# Patient Record
Sex: Male | Born: 1960 | Race: White | Hispanic: No | Marital: Married | Smoking: Former smoker
Health system: Southern US, Community
[De-identification: ages and names within clinical notes are randomized; demographics above are authoritative.]

## PROBLEM LIST (undated history)

## (undated) DIAGNOSIS — E559 Vitamin D deficiency, unspecified: Secondary | ICD-10-CM

## (undated) DIAGNOSIS — I70209 Unspecified atherosclerosis of native arteries of extremities, unspecified extremity: Secondary | ICD-10-CM

## (undated) DIAGNOSIS — E119 Type 2 diabetes mellitus without complications: Secondary | ICD-10-CM

## (undated) DIAGNOSIS — I10 Essential (primary) hypertension: Secondary | ICD-10-CM

## (undated) DIAGNOSIS — R7989 Other specified abnormal findings of blood chemistry: Secondary | ICD-10-CM

## (undated) DIAGNOSIS — R011 Cardiac murmur, unspecified: Secondary | ICD-10-CM

## (undated) DIAGNOSIS — R739 Hyperglycemia, unspecified: Secondary | ICD-10-CM

## (undated) DIAGNOSIS — E785 Hyperlipidemia, unspecified: Secondary | ICD-10-CM

## (undated) DIAGNOSIS — F410 Panic disorder [episodic paroxysmal anxiety] without agoraphobia: Secondary | ICD-10-CM

## (undated) DIAGNOSIS — N529 Male erectile dysfunction, unspecified: Secondary | ICD-10-CM

## (undated) DIAGNOSIS — Z720 Tobacco use: Secondary | ICD-10-CM

## (undated) DIAGNOSIS — K5790 Diverticulosis of intestine, part unspecified, without perforation or abscess without bleeding: Secondary | ICD-10-CM

## (undated) HISTORY — DX: Type 2 diabetes mellitus without complications: E11.9

## (undated) HISTORY — DX: Cardiac murmur, unspecified: R01.1

## (undated) HISTORY — DX: Hyperglycemia, unspecified: R73.9

## (undated) HISTORY — DX: Diverticulosis of intestine, part unspecified, without perforation or abscess without bleeding: K57.90

## (undated) HISTORY — DX: Unspecified atherosclerosis of native arteries of extremities, unspecified extremity: I70.209

## (undated) HISTORY — DX: Hyperlipidemia, unspecified: E78.5

## (undated) HISTORY — DX: Panic disorder (episodic paroxysmal anxiety): F41.0

## (undated) HISTORY — PX: VASECTOMY: SHX75

## (undated) HISTORY — DX: Essential (primary) hypertension: I10

## (undated) HISTORY — DX: Male erectile dysfunction, unspecified: N52.9

## (undated) HISTORY — DX: Tobacco use: Z72.0

## (undated) HISTORY — DX: Vitamin D deficiency, unspecified: E55.9

## (undated) HISTORY — PX: TONSILLECTOMY: SHX5217

## (undated) HISTORY — DX: Other specified abnormal findings of blood chemistry: R79.89

---

## 1998-02-03 ENCOUNTER — Ambulatory Visit (HOSPITAL_BASED_OUTPATIENT_CLINIC_OR_DEPARTMENT_OTHER): Admission: RE | Admit: 1998-02-03 | Discharge: 1998-02-03 | Payer: Self-pay | Admitting: *Deleted

## 2000-08-20 ENCOUNTER — Encounter (INDEPENDENT_AMBULATORY_CARE_PROVIDER_SITE_OTHER): Payer: Self-pay | Admitting: Specialist

## 2000-08-20 ENCOUNTER — Other Ambulatory Visit: Admission: RE | Admit: 2000-08-20 | Discharge: 2000-08-20 | Payer: Self-pay | Admitting: Urology

## 2004-01-13 ENCOUNTER — Ambulatory Visit (HOSPITAL_COMMUNITY): Admission: RE | Admit: 2004-01-13 | Discharge: 2004-01-13 | Payer: Self-pay | Admitting: Internal Medicine

## 2005-06-27 ENCOUNTER — Ambulatory Visit: Payer: Self-pay | Admitting: Internal Medicine

## 2005-07-11 ENCOUNTER — Ambulatory Visit: Payer: Self-pay | Admitting: Internal Medicine

## 2005-08-08 ENCOUNTER — Ambulatory Visit: Payer: Self-pay | Admitting: Internal Medicine

## 2006-02-09 ENCOUNTER — Ambulatory Visit: Payer: Self-pay | Admitting: Internal Medicine

## 2006-05-25 ENCOUNTER — Ambulatory Visit: Payer: Self-pay | Admitting: Internal Medicine

## 2006-08-16 ENCOUNTER — Ambulatory Visit: Payer: Self-pay | Admitting: Internal Medicine

## 2006-08-22 ENCOUNTER — Encounter: Payer: Self-pay | Admitting: Internal Medicine

## 2006-08-22 DIAGNOSIS — I10 Essential (primary) hypertension: Secondary | ICD-10-CM

## 2006-08-22 DIAGNOSIS — E785 Hyperlipidemia, unspecified: Secondary | ICD-10-CM

## 2006-08-22 DIAGNOSIS — E1169 Type 2 diabetes mellitus with other specified complication: Secondary | ICD-10-CM | POA: Insufficient documentation

## 2006-08-23 ENCOUNTER — Ambulatory Visit: Payer: Self-pay | Admitting: Internal Medicine

## 2006-08-23 LAB — CONVERTED CEMR LAB
ALT: 57 units/L — ABNORMAL HIGH (ref 0–40)
AST: 41 units/L — ABNORMAL HIGH (ref 0–37)
Albumin: 4.5 g/dL (ref 3.5–5.2)
BUN: 23 mg/dL (ref 6–23)
Basophils Absolute: 0 10*3/uL (ref 0.0–0.1)
Basophils Relative: 0.6 % (ref 0.0–1.0)
Calcium: 10.5 mg/dL (ref 8.4–10.5)
Chloride: 102 meq/L (ref 96–112)
Eosinophils Absolute: 0.6 10*3/uL (ref 0.0–0.6)
Lymphocytes Relative: 32 % (ref 12.0–46.0)
MCV: 96.2 fL (ref 78.0–100.0)
Monocytes Absolute: 1 10*3/uL — ABNORMAL HIGH (ref 0.2–0.7)
Monocytes Relative: 11.6 % — ABNORMAL HIGH (ref 3.0–11.0)
Neutro Abs: 4 10*3/uL (ref 1.4–7.7)
Potassium: 4.3 meq/L (ref 3.5–5.1)
Total Bilirubin: 0.8 mg/dL (ref 0.3–1.2)
Total Protein: 7.6 g/dL (ref 6.0–8.3)

## 2008-12-06 ENCOUNTER — Emergency Department (HOSPITAL_COMMUNITY): Admission: EM | Admit: 2008-12-06 | Discharge: 2008-12-07 | Payer: Self-pay | Admitting: Emergency Medicine

## 2009-02-18 ENCOUNTER — Ambulatory Visit: Payer: Self-pay | Admitting: Internal Medicine

## 2009-03-22 ENCOUNTER — Ambulatory Visit: Payer: Self-pay | Admitting: Internal Medicine

## 2009-07-12 ENCOUNTER — Ambulatory Visit: Payer: Self-pay | Admitting: Internal Medicine

## 2009-07-12 LAB — CONVERTED CEMR LAB
ALT: 51 units/L (ref 0–53)
Alkaline Phosphatase: 81 units/L (ref 39–117)
Basophils Absolute: 0.1 10*3/uL (ref 0.0–0.1)
Basophils Relative: 1 % (ref 0.0–3.0)
CO2: 29 meq/L (ref 19–32)
Cholesterol: 214 mg/dL — ABNORMAL HIGH (ref 0–200)
Eosinophils Absolute: 0.8 10*3/uL — ABNORMAL HIGH (ref 0.0–0.7)
Glucose, Bld: 128 mg/dL — ABNORMAL HIGH (ref 70–99)
Glucose, Urine, Semiquant: NEGATIVE
Hemoglobin: 16.8 g/dL (ref 13.0–17.0)
Lymphocytes Relative: 29.6 % (ref 12.0–46.0)
MCHC: 33.7 g/dL (ref 30.0–36.0)
Monocytes Relative: 7.7 % (ref 3.0–12.0)
Neutrophils Relative %: 53.7 % (ref 43.0–77.0)
Nitrite: NEGATIVE
Potassium: 4.3 meq/L (ref 3.5–5.1)
Total Bilirubin: 0.6 mg/dL (ref 0.3–1.2)
Total Protein: 7.8 g/dL (ref 6.0–8.3)
Triglycerides: 445 mg/dL — ABNORMAL HIGH (ref 0.0–149.0)
VLDL: 89 mg/dL — ABNORMAL HIGH (ref 0.0–40.0)
WBC: 9.5 10*3/uL (ref 4.5–10.5)
pH: 5.5

## 2009-07-19 ENCOUNTER — Ambulatory Visit: Payer: Self-pay | Admitting: Internal Medicine

## 2009-11-15 ENCOUNTER — Ambulatory Visit: Payer: Self-pay | Admitting: Internal Medicine

## 2010-05-15 ENCOUNTER — Encounter: Payer: Self-pay | Admitting: Internal Medicine

## 2010-05-17 ENCOUNTER — Ambulatory Visit
Admission: RE | Admit: 2010-05-17 | Discharge: 2010-05-17 | Payer: Self-pay | Source: Home / Self Care | Attending: Internal Medicine | Admitting: Internal Medicine

## 2010-05-24 NOTE — Assessment & Plan Note (Signed)
Summary: 4 MONTH FUP//CCM   Vital Signs:  Patient profile:   50 year old male Weight:      191 pounds Temp:     98.3 degrees F BP sitting:   110 / 70  (right arm) Cuff size:   regular  Vitals Entered By: Duard Brady LPN (July 19, 2009 10:06 AM) A  History of Present Illness: 50 year old patient who is seen today for follow-up of his hypertension and dyslipidemia.  Unfortunately, he has resumed the tobacco use.  His lipid profile was reviewed.  LDL was at goal, but HDL was borderline low with hypertriglyceridemia.  He is using this will stop medications at the present time, but has never been given a trial of niacin  Allergies: 1)  ! Benadryl  Past History:  Past Medical History: Reviewed history from 08/22/2006 and no changes required. Hyperlipidemia Hypertension  Review of Systems  The patient denies anorexia, fever, weight loss, weight gain, vision loss, decreased hearing, hoarseness, chest pain, syncope, dyspnea on exertion, peripheral edema, prolonged cough, headaches, hemoptysis, abdominal pain, melena, hematochezia, severe indigestion/heartburn, hematuria, incontinence, genital sores, muscle weakness, suspicious skin lesions, transient blindness, difficulty walking, depression, unusual weight change, abnormal bleeding, enlarged lymph nodes, angioedema, breast masses, and testicular masses.    Physical Exam  General:  Well-developed,well-nourished,in no acute distress; alert,appropriate and cooperative throughout examination Head:  Normocephalic and atraumatic without obvious abnormalities. No apparent alopecia or balding. Eyes:  No corneal or conjunctival inflammation noted. EOMI. Perrla. Funduscopic exam benign, without hemorrhages, exudates or papilledema. Vision grossly normal. Ears:  External ear exam shows no significant lesions or deformities.  Otoscopic examination reveals clear canals, tympanic membranes are intact bilaterally without bulging, retraction,  inflammation or discharge. Hearing is grossly normal bilaterally. Mouth:  Oral mucosa and oropharynx without lesions or exudates.  Teeth in good repair. Neck:  No deformities, masses, or tenderness noted. Lungs:  Normal respiratory effort, chest expands symmetrically. Lungs are clear to auscultation, no crackles or wheezes. Heart:  Normal rate and regular rhythm. S1 and S2 normal without gallop, murmur, click, rub or other extra sounds.   Impression & Recommendations:  Problem # 1:  HYPERTENSION (ICD-401.9)  His updated medication list for this problem includes:    Lisinopril 40 Mg Tabs (Lisinopril) .Marland Kitchen... Take 1 tablet by mouth once a day    Labetalol Hcl 200 Mg Tabs (Labetalol hcl) .Marland Kitchen..Marland Kitchen Two tablets  twice daily    Chlorthalidone 25 Mg Tabs (Chlorthalidone) ..... One daily  Problem # 2:  HYPERLIPIDEMIA (ICD-272.4)  His updated medication list for this problem includes:    Simvastatin 20 Mg Tabs (Simvastatin) .Marland Kitchen... Take 1 tablet by mouth once a day    Niacin Cr 1000 Mg Cr-tabs (Niacin) ..... One at bedtime  Complete Medication List: 1)  Chantix 0.5 Mg Tabs (Varenicline tartrate) .... Take 1 tablet by mouth once a day 2)  Simvastatin 20 Mg Tabs (Simvastatin) .... Take 1 tablet by mouth once a day 3)  Lisinopril 40 Mg Tabs (Lisinopril) .... Take 1 tablet by mouth once a day 4)  Labetalol Hcl 200 Mg Tabs (Labetalol hcl) .... Two tablets  twice daily 5)  Chlorthalidone 25 Mg Tabs (Chlorthalidone) .... One daily 6)  Fish Oil Double Strength 1200 Mg Caps (Omega-3 fatty acids) .... Use daily 7)  Niacin Cr 1000 Mg Cr-tabs (Niacin) .... One at bedtime  Patient Instructions: 1)  Limit your Sodium (Salt). 2)  It is important that you exercise regularly at least 20 minutes 5 times a  week. If you develop chest pain, have severe difficulty breathing, or feel very tired , stop exercising immediately and seek medical attention. 3)  add fish oil supplements 4)  titrate Niacin as discussed 5)   Please schedule a follow-up appointment in 4 months.

## 2010-05-24 NOTE — Assessment & Plan Note (Signed)
Summary: 4 mopnth rov/njr   Vital Signs:  Patient profile:   50 year old male Weight:      189 pounds Temp:     98.0 degrees F oral BP sitting:   120 / 80  (right arm) Cuff size:   regular  Vitals Entered By: Duard Brady LPN (November 15, 2009 10:23 AM) CC: 4 mos rov - doing ok - having problems with in-grown hairs under arms Is Patient Diabetic? No   CC:  4 mos rov - doing ok - having problems with in-grown hairs under arms.  History of Present Illness: 50 year old patient who is seen today for follow-up of his hypertension.  He has two dyslipidemia.  He is doing quite well and his only complaint is some mild areas of axillary folliculitis.  He is exercising more regularly.  Preventive Screening-Counseling & Management  Alcohol-Tobacco     Smoking Status: current  Allergies: 1)  ! Benadryl  Past History:  Past Medical History: Reviewed history from 08/22/2006 and no changes required. Hyperlipidemia Hypertension  Physical Exam  General:  Well-developed,well-nourished,in no acute distress; alert,appropriate and cooperative throughout examination; 120/74 Head:  Normocephalic and atraumatic without obvious abnormalities. No apparent alopecia or balding. Eyes:  No corneal or conjunctival inflammation noted. EOMI. Perrla. Funduscopic exam benign, without hemorrhages, exudates or papilledema. Vision grossly normal. Mouth:  Oral mucosa and oropharynx without lesions or exudates.  Teeth in good repair. Neck:  No deformities, masses, or tenderness noted. Lungs:  Normal respiratory effort, chest expands symmetrically. Lungs are clear to auscultation, no crackles or wheezes. Heart:  Normal rate and regular rhythm. S1 and S2 normal without gallop, murmur, click, rub or other extra sounds. Abdomen:  Bowel sounds positive,abdomen soft and non-tender without masses, organomegaly or hernias noted. Msk:  No deformity or scoliosis noted of thoracic or lumbar spine.   Pulses:  R and L  carotid,radial,femoral,dorsalis pedis and posterior tibial pulses are full and equal bilaterally Extremities:  No clubbing, cyanosis, edema, or deformity noted with normal full range of motion of all joints.     Impression & Recommendations:  Problem # 1:  HYPERTENSION (ICD-401.9)  His updated medication list for this problem includes:    Lisinopril 40 Mg Tabs (Lisinopril) .Marland Kitchen... Take 1 tablet by mouth once a day    Labetalol Hcl 200 Mg Tabs (Labetalol hcl) .Marland Kitchen..Marland Kitchen Two tablets  twice daily    Chlorthalidone 25 Mg Tabs (Chlorthalidone) ..... One daily  His updated medication list for this problem includes:    Lisinopril 40 Mg Tabs (Lisinopril) .Marland Kitchen... Take 1 tablet by mouth once a day    Labetalol Hcl 200 Mg Tabs (Labetalol hcl) .Marland Kitchen..Marland Kitchen Two tablets  twice daily    Chlorthalidone 25 Mg Tabs (Chlorthalidone) ..... One daily  Problem # 2:  HYPERLIPIDEMIA (ICD-272.4)  His updated medication list for this problem includes:    Simvastatin 20 Mg Tabs (Simvastatin) .Marland Kitchen... Take 1 tablet by mouth once a day    Niacin Cr 1000 Mg Cr-tabs (Niacin) ..... One at bedtime  His updated medication list for this problem includes:    Simvastatin 20 Mg Tabs (Simvastatin) .Marland Kitchen... Take 1 tablet by mouth once a day    Niacin Cr 1000 Mg Cr-tabs (Niacin) ..... One at bedtime  Complete Medication List: 1)  Simvastatin 20 Mg Tabs (Simvastatin) .... Take 1 tablet by mouth once a day 2)  Lisinopril 40 Mg Tabs (Lisinopril) .... Take 1 tablet by mouth once a day 3)  Labetalol Hcl 200 Mg Tabs (  Labetalol hcl) .... Two tablets  twice daily 4)  Chlorthalidone 25 Mg Tabs (Chlorthalidone) .... One daily 5)  Fish Oil Double Strength 1200 Mg Caps (Omega-3 fatty acids) .... Use daily 6)  Niacin Cr 1000 Mg Cr-tabs (Niacin) .... One at bedtime  Patient Instructions: 1)  Please schedule a follow-up appointment in 6 months. 2)  Limit your Sodium (Salt). 3)  It is important that you exercise regularly at least 20 minutes 5 times a  week. If you develop chest pain, have severe difficulty breathing, or feel very tired , stop exercising immediately and seek medical attention. 4)  You need to lose weight. Consider a lower calorie diet and regular exercise.  5)  Check your Blood Pressure regularly. If it is above: 150/90 you should make an appointment. Prescriptions: CHLORTHALIDONE 25 MG TABS (CHLORTHALIDONE) one daily  #90 x 4   Entered and Authorized by:   Gordy Savers  MD   Signed by:   Gordy Savers  MD on 11/15/2009   Method used:   [facility]  [address] [phone][facility] (retail)       [address]       [place], [place]  [address]       Ph: [phone] or [phone]       Fax: [phone]   RxID:   [id number] LABETALOL HCL 200 MG TABS (LABETALOL HCL) two tablets  twice daily  #360 x 4   Entered and Authorized by:   Gordy Savers  MD   Signed by:   Gordy Savers  MD on 11/15/2009   Method used:   [facility]        [facility]  [address] [phone]* (retail)       [address]       [place], [place]  [address]       Ph: [phone] or [phone]       Fax: [phone]   RxID:   [id number] LISINOPRIL 40 MG  TABS (LISINOPRIL) Take 1 tablet by mouth once a day  #90 x 4   Entered and Authorized by:   Gordy Savers  MD   Signed by:   Gordy Savers  MD on 11/15/2009   Method used:   [facility]        [facility]  [address] [phone]* (retail)       [address]       [place], [place]  [address]       Ph: [phone] or [phone]       Fax: [phone]   RxID:   [id number] SIMVASTATIN 20 MG  TABS (SIMVASTATIN) Take 1 tablet by mouth once a day  #90 x 4   Entered and Authorized by:   Gordy Savers  MD   Signed by:   Gordy Savers  MD on 11/15/2009   Method used:   [facility]        [facility]  [address] [phone]* (retail)       [address]       [place], [place]  [address]       Ph: [phone] or  [phone]       Fax: [phone]   RxID:   [id number]

## 2010-05-26 NOTE — Assessment & Plan Note (Signed)
Summary: 6 month rov/njr   Vital Signs:  Patient profile:   50 year old male Weight:      192 pounds Temp:     97.7 degrees F oral BP sitting:   118 / 80  (right arm) Cuff size:   regular  Vitals Entered By: Duard Brady LPN (May 17, 2010 10:11 AM) [facility]: 6 mos rov - doing well Is Patient Diabetic? No   CC:  6 mos rov - doing well.  History of Present Illness: 23 -year-old patient who is seen today for follow-up of his hypertension.  He has dyslipidemia, controlled on simvastatin 20 mg daily.  He checks his blood pressure live 3 times weekly with readings usually normal.  Occasionally mildly elevated.  He is seen here today with a nice low normal blood pressure reading.  He is on triple therapy.  He has recently joined a Psychologist, forensic.  Unfortunately, continues to smoke.  Preventive Screening-Counseling & Management  Alcohol-Tobacco     Smoking Status: current     Smoking Cessation Counseling: yes  Allergies: 1)  ! Benadryl  Past History:  Past Medical History: Hyperlipidemia Hypertension tobacco abuse erectile dysfunction  Past Surgical History: Reviewed history from 08/22/2006 and no changes required. Tonsillectomy Vasectomy  Social History: Reviewed history from 12/03/2006 and no changes required. Married Current Smoker Alcohol use-yes Drug use-no Regular exercise-yes   Review of Systems  The patient denies anorexia, fever, weight loss, weight gain, vision loss, decreased hearing, hoarseness, chest pain, syncope, dyspnea on exertion, peripheral edema, prolonged cough, headaches, hemoptysis, abdominal pain, melena, hematochezia, severe indigestion/heartburn, hematuria, incontinence, genital sores, muscle weakness, suspicious skin lesions, transient blindness, difficulty walking, depression, unusual weight change, abnormal bleeding, enlarged lymph nodes, angioedema, breast masses, and testicular masses.    Physical Exam  General:   Well-developed,well-nourished,in no acute distress; alert,appropriate and cooperative throughout examination; 118/78 Head:  Normocephalic and atraumatic without obvious abnormalities. No apparent alopecia or balding. Mouth:  Oral mucosa and oropharynx without lesions or exudates.  Teeth in good repair. Neck:  No deformities, masses, or tenderness noted. Lungs:  Normal respiratory effort, chest expands symmetrically. Lungs are clear to auscultation, no crackles or wheezes. Heart:  Normal rate and regular rhythm. S1 and S2 normal without gallop, murmur, click, rub or other extra sounds. Abdomen:  Bowel sounds positive,abdomen soft and non-tender without masses, organomegaly or hernias noted.   Impression & Recommendations:  Problem # 1:  HYPERTENSION (ICD-401.9)  His updated medication list for this problem includes:    Lisinopril 40 Mg Tabs (Lisinopril) .Marland Kitchen... Take 1 tablet by mouth once a day    Labetalol Hcl 200 Mg Tabs (Labetalol hcl) .Marland Kitchen..Marland Kitchen Two tablets  twice daily    Chlorthalidone 25 Mg Tabs (Chlorthalidone) ..... One daily  His updated medication list for this problem includes:    Lisinopril 40 Mg Tabs (Lisinopril) .Marland Kitchen... Take 1 tablet by mouth once a day    Labetalol Hcl 200 Mg Tabs (Labetalol hcl) .Marland Kitchen..Marland Kitchen Two tablets  twice daily    Chlorthalidone 25 Mg Tabs (Chlorthalidone) ..... One daily  Problem # 2:  HYPERLIPIDEMIA (ICD-272.4)  His updated medication list for this problem includes:    Simvastatin 20 Mg Tabs (Simvastatin) .Marland Kitchen... Take 1 tablet by mouth once a day    Niacin Cr 1000 Mg Cr-tabs (Niacin) ..... One at bedtime  His updated medication list for this problem includes:    Simvastatin 20 Mg Tabs (Simvastatin) .Marland Kitchen... Take 1 tablet by mouth once a day  Niacin Cr 1000 Mg Cr-tabs (Niacin) ..... One at bedtime  Complete Medication List: 1)  Simvastatin 20 Mg Tabs (Simvastatin) .... Take 1 tablet by mouth once a day 2)  Lisinopril 40 Mg Tabs (Lisinopril) .... Take 1 tablet by  mouth once a day 3)  Labetalol Hcl 200 Mg Tabs (Labetalol hcl) .... Two tablets  twice daily 4)  Chlorthalidone 25 Mg Tabs (Chlorthalidone) .... One daily 5)  Fish Oil Double Strength 1200 Mg Caps (Omega-3 fatty acids) .... Use daily 6)  Niacin Cr 1000 Mg Cr-tabs (Niacin) .... One at bedtime 7)  Cialis 20 Mg Tabs (Tadalafil) .... Use daily as directed  Patient Instructions: 1)  Please schedule a follow-up appointment in 6 months. 2)  Limit your Sodium (Salt). 3)  Tobacco is very bad for your health and your loved ones! You Should stop smoking!. 4)  It is important that you exercise regularly at least 20 minutes 5 times a week. If you develop chest pain, have severe difficulty breathing, or feel very tired , stop exercising immediately and seek medical attention. 5)  You need to lose weight. Consider a lower calorie diet and regular exercise.  Prescriptions: CIALIS 20 MG TABS (TADALAFIL) use daily as directed  #12 x 6   Entered and Authorized by:   Gordy Savers  MD   Signed by:   Gordy Savers  MD on 05/17/2010   Method used:   [facility]        [facility]  [address] [phone]* (retail)       [address]       [place], [place]  [address]       Ph: [phone] or [phone]       Fax: [phone]   RxID:   [id number] CHLORTHALIDONE 25 MG TABS (CHLORTHALIDONE) one daily  #90 Tablet x 6   Entered and Authorized by:   Gordy Savers  MD   Signed by:   Gordy Savers  MD on 05/17/2010   Method used:   [facility]        [facility]  [address] [phone]* (retail)       [address]       [place], [place]  [address]       Ph: [phone] or [phone]       Fax: [phone]   RxID:   [id number] LABETALOL HCL 200 MG TABS (LABETALOL HCL) two tablets  twice daily  #360 Tablet x 6   Entered and Authorized by:   Gordy Savers  MD   Signed by:   Gordy Savers  MD on 05/17/2010   Method used:   [facility]        [facility]  [address] [phone]* (retail)       [address]       [place], [place]  [address]       Ph: [phone] or [phone]       Fax: [phone]   RxID:   [id number] LISINOPRIL 40 MG  TABS (LISINOPRIL) Take 1 tablet by mouth once a day  #90 Tablet x 6   Entered and Authorized by:   Gordy Savers  MD   Signed by:   Gordy Savers  MD on 05/17/2010   Method used:   [facility]        [facility]  [address] [phone]* (retail)       [address]  [place], [place]  [address]       Ph: [phone] or [phone]       Fax: [phone]   RxID:   [id number] SIMVASTATIN 20 MG  TABS (SIMVASTATIN) Take 1 tablet by mouth once a day  #90 Tablet x 6   Entered and Authorized by:   Gordy Savers  MD   Signed by:   Gordy Savers  MD on 05/17/2010   Method used:   [facility]        [facility]  [address] [phone]* (retail)       [address]       [place], [place]  [address]       Ph: [phone] or [phone]       Fax: [phone]   RxID:   [id number]    Orders Added: 1)  Est. Patient Level III [[address]]

## 2010-07-31 LAB — POCT I-STAT, CHEM 8
HCT: 59 % — ABNORMAL HIGH (ref 39.0–52.0)
Potassium: 4.3 mEq/L (ref 3.5–5.1)
Sodium: 140 mEq/L (ref 135–145)

## 2010-09-09 NOTE — Assessment & Plan Note (Signed)
[facility]                            [facility] OFFICE NOTE   Jacob Arnold, Jacob Arnold                    MRN:          [id number]  DATE:08/23/2006                            DOB:          06-17-1960    The patient is seen today for a wellness exam.  He is 50 years old with  a history of hypertension and dyslipidemia.  He has successfully  discontinued smoking.  He was hospitalized in 2005 for pericarditis and  did have a heart catheterization done at that time.  This apparently was  complicated by contrast-associated renal failure.  He is doing well  today.   FAMILY HISTORY:  Family history reviewed.  Positive for hypertension,  hyperlipidemia.  Father has coronary artery disease.   PHYSICAL EXAMINATION:  GENERAL:  A healthy-appearing, fit male.  VITAL SIGNS:  Blood pressure was 120/80.  HEENT:  Fundi, ears, nose and throat clear.  NECK:  No bruits or adenopathy.  There is some mild thyroid enlargement.  CHEST:  Clear.  CARDIOVASCULAR:  Normal heart sounds.  No murmurs.  ABDOMEN:  Benign.  EXTERNAL GENITALIA:  Normal.  RECTAL:  The prostate is small and benign.  Stool heme negative.  EXTREMITIES:  Full peripheral pulses.  No edema.   IMPRESSION:  1. Hypertension.  2. Hyperlipidemia.  3. Family history of coronary artery disease.   DISPOSITION:  His simvastatin will be increased to 80 mg daily.  He did  have some GI distress with Lipitor.  Will make this a 40 mg b.i.d. dose  initially.  Otherwise, his medical regimen is unchanged.  Laboratory  studies will be reviewed.     Gordy Savers, MD  Electronically Signed    PFK/MedQ  DD: 08/23/2006  DT: 08/23/2006  Job #: [id number]

## 2010-11-02 ENCOUNTER — Encounter: Payer: Self-pay | Admitting: Internal Medicine

## 2010-11-08 ENCOUNTER — Ambulatory Visit (INDEPENDENT_AMBULATORY_CARE_PROVIDER_SITE_OTHER): Payer: BC Managed Care – PPO | Admitting: Internal Medicine

## 2010-11-08 ENCOUNTER — Encounter: Payer: Self-pay | Admitting: Internal Medicine

## 2010-11-08 DIAGNOSIS — E785 Hyperlipidemia, unspecified: Secondary | ICD-10-CM

## 2010-11-08 DIAGNOSIS — I10 Essential (primary) hypertension: Secondary | ICD-10-CM

## 2010-11-08 LAB — LIPID PANEL
Total CHOL/HDL Ratio: 7
Triglycerides: 779 mg/dL — ABNORMAL HIGH (ref 0.0–149.0)
VLDL: 155.8 mg/dL — ABNORMAL HIGH (ref 0.0–40.0)

## 2010-11-08 NOTE — Patient Instructions (Signed)
Limit your sodium (Salt) intake    It is important that you exercise regularly, at least 20 minutes 3 to 4 times per week.  If you develop chest pain or shortness of breath seek  medical attention.  Please check your blood pressure on a regular basis.  If it is consistently greater than 150/90, please make an office appointment.  Return in 6 months for follow-up   

## 2010-11-08 NOTE — Progress Notes (Signed)
  Subjective:    Patient ID: Jacob Arnold, male    DOB: 06-Aug-1960, 50 y.o.   MRN: [id number]  HPI  50 year old patient Since then for followup of his hypertension. He has dyslipidemia and is on simvastatin 20 mg daily. His last cholesterol was 214. He remains on triple therapy which includes chlorthalidone labetalol and lisinopril. He does monitor home blood pressure readings with nice results. He is asymptomatic    Review of Systems  Constitutional: Negative for fever, chills, appetite change and fatigue.  HENT: Negative for hearing loss, ear pain, congestion, sore throat, trouble swallowing, neck stiffness, dental problem, voice change and tinnitus.   Eyes: Negative for pain, discharge and visual disturbance.  Respiratory: Negative for cough, chest tightness, wheezing and stridor.   Cardiovascular: Negative for chest pain, palpitations and leg swelling.  Gastrointestinal: Negative for nausea, vomiting, abdominal pain, diarrhea, constipation, blood in stool and abdominal distention.  Genitourinary: Negative for urgency, hematuria, flank pain, discharge, difficulty urinating and genital sores.  Musculoskeletal: Negative for myalgias, back pain, joint swelling, arthralgias and gait problem.  Skin: Negative for rash.  Neurological: Negative for dizziness, syncope, speech difficulty, weakness, numbness and headaches.  Hematological: Negative for adenopathy. Does not bruise/bleed easily.  Psychiatric/Behavioral: Negative for behavioral problems and dysphoric mood. The patient is not nervous/anxious.        Objective:   Physical Exam  Constitutional: He is oriented to person, place, and time. He appears well-developed.  HENT:  Head: Normocephalic.  Right Ear: External ear normal.  Left Ear: External ear normal.  Eyes: Conjunctivae and EOM are normal.  Neck: Normal range of motion.  Cardiovascular: Normal rate and normal heart sounds.   Pulmonary/Chest: Breath sounds normal.    Abdominal: Bowel sounds are normal.  Musculoskeletal: Normal range of motion. He exhibits no edema and no tenderness.  Neurological: He is alert and oriented to person, place, and time.  Psychiatric: He has a normal mood and affect. His behavior is normal.          Assessment & Plan:  Hypertension well controlled Dyslipidemia we'll check a lipid panel   Recheck 6 months

## 2011-05-09 ENCOUNTER — Ambulatory Visit: Payer: BC Managed Care – PPO | Admitting: Internal Medicine

## 2011-05-09 ENCOUNTER — Telehealth: Payer: Self-pay

## 2011-05-09 NOTE — Telephone Encounter (Signed)
Opened in error

## 2011-08-08 ENCOUNTER — Other Ambulatory Visit: Payer: Self-pay | Admitting: Internal Medicine

## 2011-10-04 ENCOUNTER — Emergency Department (HOSPITAL_COMMUNITY)
Admission: EM | Admit: 2011-10-04 | Discharge: 2011-10-04 | Disposition: A | Discharge status: Home / Self Care | Payer: Managed Care, Other (non HMO) | Attending: Emergency Medicine | Admitting: Emergency Medicine

## 2011-10-04 ENCOUNTER — Emergency Department (HOSPITAL_COMMUNITY): Payer: Managed Care, Other (non HMO)

## 2011-10-04 ENCOUNTER — Telehealth: Payer: Self-pay | Admitting: Family Medicine

## 2011-10-04 ENCOUNTER — Encounter (HOSPITAL_COMMUNITY): Payer: Self-pay | Admitting: Neurology

## 2011-10-04 DIAGNOSIS — E785 Hyperlipidemia, unspecified: Secondary | ICD-10-CM | POA: Insufficient documentation

## 2011-10-04 DIAGNOSIS — I10 Essential (primary) hypertension: Secondary | ICD-10-CM | POA: Insufficient documentation

## 2011-10-04 DIAGNOSIS — R079 Chest pain, unspecified: Secondary | ICD-10-CM | POA: Insufficient documentation

## 2011-10-04 DIAGNOSIS — F17200 Nicotine dependence, unspecified, uncomplicated: Secondary | ICD-10-CM | POA: Insufficient documentation

## 2011-10-04 DIAGNOSIS — Z79899 Other long term (current) drug therapy: Secondary | ICD-10-CM | POA: Insufficient documentation

## 2011-10-04 LAB — COMPREHENSIVE METABOLIC PANEL
AST: 49 U/L — ABNORMAL HIGH (ref 0–37)
Albumin: 3.8 g/dL (ref 3.5–5.2)
BUN: 16 mg/dL (ref 6–23)
CO2: 25 mEq/L (ref 19–32)
Chloride: 103 mEq/L (ref 96–112)
Creatinine, Ser: 1.41 mg/dL — ABNORMAL HIGH (ref 0.50–1.35)
Potassium: 4.7 mEq/L (ref 3.5–5.1)
Sodium: 138 mEq/L (ref 135–145)
Total Bilirubin: 0.3 mg/dL (ref 0.3–1.2)
Total Protein: 7 g/dL (ref 6.0–8.3)

## 2011-10-04 LAB — DIFFERENTIAL
Basophils Relative: 0 % (ref 0–1)
Eosinophils Relative: 7 % — ABNORMAL HIGH (ref 0–5)
Neutro Abs: 7.3 10*3/uL (ref 1.7–7.7)

## 2011-10-04 LAB — CBC
HCT: 53.2 % — ABNORMAL HIGH (ref 39.0–52.0)
MCHC: 35.5 g/dL (ref 30.0–36.0)
RDW: 13.8 % (ref 11.5–15.5)
WBC: 12 10*3/uL — ABNORMAL HIGH (ref 4.0–10.5)

## 2011-10-04 LAB — TROPONIN I: Troponin I: <0.3 ng/mL (ref <0.30)

## 2011-10-04 MED ORDER — LISINOPRIL 40 MG PO TABS: 40.0000 mg | ORAL_TABLET | Freq: Every day | ORAL | Status: DC

## 2011-10-04 MED ORDER — CHLORTHALIDONE 25 MG PO TABS: 25.0000 mg | ORAL_TABLET | Freq: Every day | ORAL | Status: DC

## 2011-10-04 NOTE — Telephone Encounter (Signed)
Pt is coming in tomorrow for post ER f/u. However, he is completely out of the Lisinopril and [place]. Can we send these in today to [facility] [address] [address]

## 2011-10-04 NOTE — ED Provider Notes (Signed)
History     CSN: [id number]  Arrival date & time 10/04/11  0749   First MD Initiated Contact with Patient 10/04/11 0754      Chief Complaint  Patient presents with  . Chest Pain    (Consider location/radiation/quality/duration/timing/severity/associated sxs/prior treatment) Patient is a 51 y.o. male presenting with chest pain. The history is provided by the patient (the pt had chest pain today relieved by nitro and aspirin by paramedics). No language interpreter was used.  Chest Pain The chest pain began less than 1 hour ago. Chest pain occurs rarely. The chest pain is resolved. Associated with: nothing. At its most intense, the pain is at 6/10. The pain is currently at 4/10. The severity of the pain is moderate. The quality of the pain is described as aching. The pain does not radiate. Chest pain is worsened by stress. Pertinent negatives for primary symptoms include no fever, no fatigue, no cough and no abdominal pain.  Pertinent negatives for past medical history include no seizures.     Past Medical History  Diagnosis Date  . HYPERLIPIDEMIA 08/22/2006  . HYPERTENSION 08/22/2006  . Tobacco abuse   . ED (erectile dysfunction)     Past Surgical History  Procedure Date  . Tonsillectomy   . Vasectomy     Family History  Problem Relation Age of Onset  . Hyperlipidemia Neg Hx     family hx  . Heart disease Neg Hx     family hx  . Hypertension Neg Hx     family hx  . Cancer Neg Hx     family hx    History  Substance Use Topics  . Smoking status: Current Everyday Smoker    Types: Cigarettes  . Smokeless tobacco: Never Used  . Alcohol Use: No      Review of Systems  Constitutional: Negative for fever and fatigue.  HENT: Negative for congestion, sinus pressure and ear discharge.   Eyes: Negative for discharge.  Respiratory: Negative for cough.   Cardiovascular: Positive for chest pain.  Gastrointestinal: Negative for abdominal pain and diarrhea.  Genitourinary:  Negative for frequency and hematuria.  Musculoskeletal: Negative for back pain.  Skin: Negative for rash.  Neurological: Negative for seizures and headaches.  Hematological: Negative.   Psychiatric/Behavioral: Negative for hallucinations.    Allergies  Diphenhydramine hcl  Home Medications   Current Outpatient Rx  Name Route Sig Dispense Refill  . CHLORTHALIDONE 25 MG PO TABS  TAKE 1 TABLET EVERY DAY 90 tablet 0  . LABETALOL HCL 200 MG PO TABS Oral Take 400 mg by mouth 2 (two) times daily.      Marland Kitchen LISINOPRIL 40 MG PO TABS  TAKE 1 TABLET EVERY DAY 90 tablet 0  . NIACIN ER (ANTIHYPERLIPIDEMIC) 1000 MG PO TBCR Oral Take 1,000 mg by mouth at bedtime.      Marland Kitchen FISH OIL 1200 MG PO CAPS Oral Take by mouth daily.      Marland Kitchen SIMVASTATIN 20 MG PO TABS  TAKE 1 TABLET BY MOUTH ONCE A DAY 90 tablet 0  . TADALAFIL 20 MG PO TABS Oral Take 20 mg by mouth daily as needed.        BP 153/99  Pulse 65  Temp 97.7 F (36.5 C) (Oral)  Resp 18  SpO2 97%  Physical Exam  Constitutional: He is oriented to person, place, and time. He appears well-developed.  HENT:  Head: Normocephalic and atraumatic.  Eyes: Conjunctivae and EOM are normal. No scleral icterus.  Neck: Neck supple. No thyromegaly present.  Cardiovascular: Normal rate and regular rhythm.  Exam reveals no gallop and no friction rub.   No murmur heard. Pulmonary/Chest: No stridor. He has no wheezes. He has no rales. He exhibits no tenderness.  Abdominal: He exhibits no distension. There is no tenderness. There is no rebound.  Musculoskeletal: Normal range of motion. He exhibits no edema.  Lymphadenopathy:    He has no cervical adenopathy.  Neurological: He is oriented to person, place, and time. Coordination normal.  Skin: No rash noted. No erythema.  Psychiatric: He has a normal mood and affect. His behavior is normal.    ED Course  Procedures (including critical care time)   Labs Reviewed  CBC  DIFFERENTIAL  COMPREHENSIVE METABOLIC  PANEL  TROPONIN I   No results found.   No diagnosis found.   Date: 10/04/2011  Rate: 66  Rhythm: normal sinus rhythm  QRS Axis: normal  Intervals: normal  ST/T Wave abnormalities: normal  Conduction Disutrbances:none  Narrative Interpretation:   Old EKG Reviewed: none available    MDM   Pt with chest pain and negative tests.  Pt told he should be put in cdu under chest pain protocol.  He refused and wanted to be discharged       Benny Lennert, MD 10/04/11 1055

## 2011-10-04 NOTE — Telephone Encounter (Signed)
done

## 2011-10-04 NOTE — ED Notes (Signed)
Per EMS- At 0430 woke up with CP, pain in mid chest, diaphoresis, n/v. Given 2 nitro, decreased pain 5/10 to 0/10. Hx pericarditis. EKG SR with 1st degree block, 180/100. After nitro 160/88. HR 60. A x 4. 18 gauge left forearm. 324 aspirin.

## 2011-10-04 NOTE — Discharge Instructions (Signed)
Take aspirin once a day.  Return if problems.  Follow up with your md this week.

## 2011-10-05 ENCOUNTER — Ambulatory Visit (INDEPENDENT_AMBULATORY_CARE_PROVIDER_SITE_OTHER): Payer: Managed Care, Other (non HMO) | Admitting: Internal Medicine

## 2011-10-05 ENCOUNTER — Encounter: Payer: Self-pay | Admitting: Internal Medicine

## 2011-10-05 ENCOUNTER — Ambulatory Visit (INDEPENDENT_AMBULATORY_CARE_PROVIDER_SITE_OTHER): Payer: Managed Care, Other (non HMO) | Admitting: Cardiovascular Disease

## 2011-10-05 VITALS — BP 120/70 | Temp 98.0°F | Wt 197.0 lb

## 2011-10-05 DIAGNOSIS — I10 Essential (primary) hypertension: Secondary | ICD-10-CM

## 2011-10-05 DIAGNOSIS — R079 Chest pain, unspecified: Secondary | ICD-10-CM

## 2011-10-05 DIAGNOSIS — E785 Hyperlipidemia, unspecified: Secondary | ICD-10-CM

## 2011-10-05 MED ORDER — LABETALOL HCL 200 MG PO TABS: 400.0000 mg | ORAL_TABLET | Freq: Two times a day (BID) | ORAL | Status: DC

## 2011-10-05 MED ORDER — SIMVASTATIN 20 MG PO TABS: 20.0000 mg | ORAL_TABLET | Freq: Every day | ORAL | Status: DC

## 2011-10-05 MED ORDER — NITROGLYCERIN 0.4 MG SL SUBL: 0.4000 mg | SUBLINGUAL_TABLET | SUBLINGUAL | Status: DC | PRN

## 2011-10-05 MED ORDER — CHLORTHALIDONE 25 MG PO TABS: 25.0000 mg | ORAL_TABLET | Freq: Every day | ORAL | Status: DC

## 2011-10-05 MED ORDER — ASPIRIN 81 MG PO TBEC: 81.0000 mg | DELAYED_RELEASE_TABLET | Freq: Every day | ORAL | Status: AC

## 2011-10-05 NOTE — Patient Instructions (Addendum)
Limit your sodium (Salt) intake  Take aspirin 81 mg daily  Stress test as discussed  Avoid any strenuous activity  Call if any recurrent chest pain   Smoking tobacco is very bad for your health. You should stop smoking immediately.

## 2011-10-05 NOTE — Progress Notes (Signed)
Patient ID: Jacob Arnold, male   DOB: 1960-05-31, 51 y.o.   MRN: [id number] 51 year old patient who has a history of ongoing tobacco use treated hypertension and dyslipidemia who was stable until yesterday when he was awoken with significant substernal chest pain at 4:30 AM. Referred by Dr Kirtland Bouchard for evaluation  Pain was moderately severe and described as both a sharp and burning quality. It radiated out to the chest wall and arm regions. Initially felt this was heartburn but took Pepto-Bismol without benefit. This usually has been helpful in the past. He then became much more anxious and diaphoretic and was evaluated in the emergency room. He denies any history of exertional chest pain. He has been out of insurance for a few months and has been out of both chlorthalidone and lisinopril but continues to take Normodyne. All medications were refilled recently.  ED evaluation included an EKG that was normal initial cardiac enzymes normal. EMS was called and after nitroglycerin sublingual pain resolved in 2 minutes. It later recurred in route to the hospital and a second nitroglycerin relieved his pain which has not reoccurred.   Unfortunately he refused to go into the CDU chest pain protocol and did not have CT or stress testing during ER evaluation.  Laboratory studies were reviewed. These are consistent with chronic kidney disease and possible impaired glucose tolerance creatinine 1.4p; random blood sugar 120.  No family history of premature heart disease father is living at 50; mother died at 59 of complications from breast cancer  Lipid profile from 1 year ago reviewed. Total cholesterol 236  ROS: Denies fever, malais, weight loss, blurry vision, decreased visual acuity, cough, sputum, SOB, hemoptysis, pleuritic pain, palpitaitons, heartburn, abdominal pain, melena, lower extremity edema, claudication, or rash.  All other systems reviewed and negative   General: Affect appropriate Healthy:  appears  stated age HEENT: normal Neck supple with no adenopathy JVP normal no bruits no thyromegaly Lungs clear with no wheezing and good diaphragmatic motion Heart:  S1/S2 no murmur,rub, gallop or click PMI normal Abdomen: benighn, BS positve, no tenderness, no AAA no bruit.  No HSM or HJR Distal pulses intact with no bruits No edema Neuro non-focal Skin warm and dry No muscular weakness  Medications Current Outpatient Prescriptions  Medication Sig Dispense Refill  . aspirin 81 MG EC tablet Take 1 tablet (81 mg total) by mouth daily. Swallow whole.  30 tablet  12  . chlorthalidone (HYGROTON) 25 MG tablet Take 1 tablet (25 mg total) by mouth daily.  90 tablet  0  . labetalol (NORMODYNE) 200 MG tablet Take 2 tablets (400 mg total) by mouth 2 (two) times daily.  180 tablet  4  . lisinopril (PRINIVIL,ZESTRIL) 40 MG tablet Take 1 tablet (40 mg total) by mouth daily.  90 tablet  0  . nitroGLYCERIN (NITROSTAT) 0.4 MG SL tablet Place 1 tablet (0.4 mg total) under the tongue every 5 (five) minutes as needed for chest pain.  50 tablet  3  . Omega-3 Fatty Acids (FISH OIL) 1200 MG CAPS Take by mouth daily.        . simvastatin (ZOCOR) 20 MG tablet Take 1 tablet (20 mg total) by mouth at bedtime.  90 tablet  4  . DISCONTD: chlorthalidone (HYGROTON) 25 MG tablet Take 1 tablet (25 mg total) by mouth daily.  90 tablet  0  . DISCONTD: labetalol (NORMODYNE) 200 MG tablet Take 400 mg by mouth 2 (two) times daily.        Marland Kitchen  DISCONTD: simvastatin (ZOCOR) 20 MG tablet TAKE 1 TABLET BY MOUTH ONCE A DAY  90 tablet  0    Allergies Diphenhydramine hcl  Family History: Family History  Problem Relation Age of Onset  . Hyperlipidemia Neg Hx     family hx  . Heart disease Neg Hx     family hx  . Hypertension Neg Hx     family hx  . Cancer Neg Hx     family hx    Social History: History   Social History  . Marital Status: Married    Spouse Name: N/A    Number of Children: N/A  . Years of Education: N/A    Occupational History  . Not on file.   Social History Main Topics  . Smoking status: Current Everyday Smoker    Types: Cigarettes  . Smokeless tobacco: Never Used  . Alcohol Use: Yes  . Drug Use: No  . Sexually Active: Not on file   Other Topics Concern  . Not on file   Social History Narrative  . No narrative on file    Electrocardiogram: 10/04/11  NSR rate 66 normal with no ischemia  Assessment and Plan

## 2011-10-05 NOTE — Assessment & Plan Note (Signed)
Typical and atypical features.  CRF;s  F/U stress echo

## 2011-10-05 NOTE — Assessment & Plan Note (Signed)
Cholesterol is at goal.  Continue current dose of statin and diet Rx.  No myalgias or side effects.  F/U  LFT's in 6 months. No results found for this basename: LDLCALC             

## 2011-10-05 NOTE — Progress Notes (Signed)
Subjective:    Patient ID: Jacob Arnold, male    DOB: 1961-04-08, 51 y.o.   MRN: [id number]  HPI  51 year old patient who has a history of ongoing tobacco use treated hypertension and dyslipidemia who was stable until yesterday when he was awoken with significant substernal chest pain at 4:30 AM. Pain was moderately severe and described as both a sharp and burning quality. It radiated out to the chest wall and arm regions. Initially felt this was heartburn but took Pepto-Bismol without benefit. This usually has been helpful in the past. He then became much more anxious and diaphoretic and was evaluated in the emergency room. He denies any history of exertional chest pain. He has been out of insurance for a few months and has been out of both chlorthalidone and lisinopril but continues to take Normodyne. All medications were refilled recently. ED evaluation included an EKG that was normal initial cardiac enzymes normal. EMS was called and after nitroglycerin sublingual pain resolved in 2 minutes. It later recurred in route to the hospital and a second nitroglycerin relieved his pain which has not reoccurred. Laboratory studies were reviewed. These are consistent with chronic kidney disease and possible impaired glucose tolerance creatinine 1.4p;  random blood sugar 120. No family history of premature heart disease father is living at 79;  mother died at 26 of complications from breast cancer Lipid profile from 1 year ago reviewed. Total cholesterol 236    Review of Systems  Constitutional: Positive for diaphoresis. Negative for fever, chills, appetite change and fatigue.  HENT: Negative for hearing loss, ear pain, congestion, sore throat, trouble swallowing, neck stiffness, dental problem, voice change and tinnitus.   Eyes: Negative for pain, discharge and visual disturbance.  Respiratory: Negative for cough, chest tightness, wheezing and stridor.   Cardiovascular: Positive for chest pain.  Negative for palpitations and leg swelling.  Gastrointestinal: Negative for nausea, vomiting, abdominal pain, diarrhea, constipation, blood in stool and abdominal distention.  Genitourinary: Negative for urgency, hematuria, flank pain, discharge, difficulty urinating and genital sores.  Musculoskeletal: Negative for myalgias, back pain, joint swelling, arthralgias and gait problem.  Skin: Negative for rash.  Neurological: Negative for dizziness, syncope, speech difficulty, weakness, numbness and headaches.  Hematological: Negative for adenopathy. Does not bruise/bleed easily.  Psychiatric/Behavioral: Negative for behavioral problems and dysphoric mood. The patient is not nervous/anxious.        Objective:   Physical Exam  Constitutional: He is oriented to person, place, and time. He appears well-developed.  HENT:  Head: Normocephalic.  Right Ear: External ear normal.  Left Ear: External ear normal.  Eyes: Conjunctivae and EOM are normal.  Neck: Normal range of motion.  Cardiovascular: Normal rate, normal heart sounds and intact distal pulses.   Pulmonary/Chest: Breath sounds normal.  Abdominal: Bowel sounds are normal.  Musculoskeletal: Normal range of motion. He exhibits no edema and no tenderness.  Neurological: He is alert and oriented to person, place, and time.  Psychiatric: He has a normal mood and affect. His behavior is normal.          Assessment & Plan:   Chest pain syndrome in a patient with multiple cardiac risk factors.  We'll schedule prompt ETT to further risk stratify. Pain somewhat atypical with multiple risk factors, probable moderate risk of CAD. We'll place on daily aspirin and provide a prescription for nitroglycerin. Until stress test is obtained will avoid any strenuous activity. He will report any further chest pain his blood pressure medications will be unchanged.  Simvastatin dose will be increased to 40 daily Hypertension Dyslipidemia Ongoing tobacco  use. Total smoking cessation encouraged

## 2011-10-05 NOTE — Assessment & Plan Note (Signed)
Well controlled.  Continue current medications and low sodium Dash type diet.    

## 2011-10-06 ENCOUNTER — Encounter: Payer: Self-pay | Admitting: Cardiovascular Disease

## 2011-10-09 ENCOUNTER — Ambulatory Visit (INDEPENDENT_AMBULATORY_CARE_PROVIDER_SITE_OTHER): Payer: Managed Care, Other (non HMO) | Admitting: Internal Medicine

## 2011-10-09 ENCOUNTER — Encounter: Payer: Self-pay | Admitting: Internal Medicine

## 2011-10-09 VITALS — BP 128/90 | Temp 97.7°F | Wt 195.0 lb

## 2011-10-09 DIAGNOSIS — R079 Chest pain, unspecified: Secondary | ICD-10-CM

## 2011-10-09 DIAGNOSIS — I10 Essential (primary) hypertension: Secondary | ICD-10-CM

## 2011-10-09 DIAGNOSIS — E785 Hyperlipidemia, unspecified: Secondary | ICD-10-CM

## 2011-10-09 NOTE — Patient Instructions (Signed)
Avoids foods high in acid such as tomatoes citrus juices, and spicy foods.  Avoid eating within two hours of lying down or before exercising.  Do not overheat.  Try smaller more frequent meals.  If symptoms persist, elevate the head of her bed 12 inches while sleeping.  Call or return to clinic prn if these symptoms worsen or fail to improve as anticipated. Diet for GERD or PUD Nutrition therapy can help ease the discomfort of gastroesophageal reflux disease (GERD) and peptic ulcer disease (PUD).   HOME CARE INSTRUCTIONS    Eat your meals slowly, in a relaxed setting.   Eat 5 to 6 small meals per day.   If a food causes distress, stop eating it for a period of time.  FOODS TO AVOID  Coffee, regular or decaffeinated.   Cola beverages, regular or low calorie.   Tea, regular or decaffeinated.   Pepper.   Cocoa.   High fat foods, including meats.   Butter, margarine, hydrogenated oil (trans fats).   Peppermint or spearmint (if you have GERD).   Fruits and vegetables if not tolerated.   Alcohol.   Nicotine (smoking or chewing). This is one of the most potent stimulants to acid production in the gastrointestinal tract.   Any food that seems to aggravate your condition.  If you have questions regarding your diet, ask your caregiver or a registered dietitian. TIPS  Lying flat may make symptoms worse. Keep the head of your bed raised 6 to 9 inches (15 to 23 cm) by using a foam wedge or blocks under the legs of the bed.   Do not lay down until 3 hours after eating a meal.   Daily physical activity may help reduce symptoms.  MAKE SURE YOU:    Understand these instructions.   Will watch your condition.   Will get help right away if you are not doing well or get worse.  Document Released: 04/10/2005 Document Revised: 03/30/2011 Document Reviewed: 02/24/2011 [facility] Patient Information 2012 [place], [place].Gastroesophageal Reflux Disease, Adult Gastroesophageal reflux disease  (GERD) happens when acid from your stomach flows up into the esophagus. When acid comes in contact with the esophagus, the acid causes soreness (inflammation) in the esophagus. Over time, GERD may create small holes (ulcers) in the lining of the esophagus. CAUSES    Increased body weight. This puts pressure on the stomach, making acid rise from the stomach into the esophagus.   Smoking. This increases acid production in the stomach.   Drinking alcohol. This causes decreased pressure in the lower esophageal sphincter (valve or ring of muscle between the esophagus and stomach), allowing acid from the stomach into the esophagus.   Late evening meals and a full stomach. This increases pressure and acid production in the stomach.   A malformed lower esophageal sphincter.  Sometimes, no cause is found. SYMPTOMS    Burning pain in the lower part of the mid-chest behind the breastbone and in the mid-stomach area. This may occur twice a week or more often.   Trouble swallowing.   Sore throat.   Dry cough.   Asthma-like symptoms including chest tightness, shortness of breath, or wheezing.  DIAGNOSIS   Your caregiver may be able to diagnose GERD based on your symptoms. In some cases, X-rays and other tests may be done to check for complications or to check the condition of your stomach and esophagus. TREATMENT   Your caregiver may recommend over-the-counter or prescription medicines to help decrease acid production. Ask your  caregiver before starting or adding any new medicines.   HOME CARE INSTRUCTIONS    Change the factors that you can control. Ask your caregiver for guidance concerning weight loss, quitting smoking, and alcohol consumption.   Avoid foods and drinks that make your symptoms worse, such as:   Caffeine or alcoholic drinks.   Chocolate.   Peppermint or mint flavorings.   Garlic and onions.   Spicy foods.   Citrus fruits, such as oranges, lemons, or limes.    Tomato-based foods such as sauce, chili, salsa, and pizza.   Fried and fatty foods.   Avoid lying down for the 3 hours prior to your bedtime or prior to taking a nap.   Eat small, frequent meals instead of large meals.   Wear loose-fitting clothing. Do not wear anything tight around your waist that causes pressure on your stomach.   Raise the head of your bed 6 to 8 inches with wood blocks to help you sleep. Extra pillows will not help.   Only take over-the-counter or prescription medicines for pain, discomfort, or fever as directed by your caregiver.   Do not take aspirin, ibuprofen, or other nonsteroidal anti-inflammatory drugs (NSAIDs).  SEEK IMMEDIATE MEDICAL CARE IF:    You have pain in your arms, neck, jaw, teeth, or back.   Your pain increases or changes in intensity or duration.   You develop nausea, vomiting, or sweating (diaphoresis).   You develop shortness of breath, or you faint.   Your vomit is green, yellow, black, or looks like coffee grounds or blood.   Your stool is red, bloody, or black.  These symptoms could be signs of other problems, such as heart disease, gastric bleeding, or esophageal bleeding. MAKE SURE YOU:    Understand these instructions.   Will watch your condition.   Will get help right away if you are not doing well or get worse.  Document Released: 01/18/2005 Document Revised: 03/30/2011 Document Reviewed: 10/28/2010 [facility] Patient Information 2012 [place], [place].

## 2011-10-09 NOTE — Progress Notes (Signed)
  Subjective:    Patient ID: Jacob Arnold, male    DOB: 07/09/1960, 51 y.o.   MRN: [id number]  HPI  51 year old patient he was seen 4 days ago with atypical chest pain. He has remained symptomatic over the weekend with burning type chest pain. He tried Zantac with no improvement but seemed to improve with omeprazole. Pain is temporarily improves with eating and aggravated by the supine position. He has a particularly difficult time at night. Pain is not relieved by nitroglycerin which he was given 4 days ago. He has a history of dyslipidemia and hypertension and now is taking all medications.    Review of Systems  Constitutional: Negative for fever, chills, appetite change and fatigue.  HENT: Negative for hearing loss, ear pain, congestion, sore throat, trouble swallowing, neck stiffness, dental problem, voice change and tinnitus.   Eyes: Negative for pain, discharge and visual disturbance.  Respiratory: Negative for cough, chest tightness, wheezing and stridor.   Cardiovascular: Positive for chest pain (burning quality). Negative for palpitations and leg swelling.  Gastrointestinal: Negative for nausea, vomiting, abdominal pain, diarrhea, constipation, blood in stool and abdominal distention.  Genitourinary: Negative for urgency, hematuria, flank pain, discharge, difficulty urinating and genital sores.  Musculoskeletal: Negative for myalgias, back pain, joint swelling, arthralgias and gait problem.  Skin: Negative for rash.  Neurological: Negative for dizziness, syncope, speech difficulty, weakness, numbness and headaches.  Hematological: Negative for adenopathy. Does not bruise/bleed easily.  Psychiatric/Behavioral: Negative for behavioral problems and dysphoric mood. The patient is not nervous/anxious.        Objective:   Physical Exam  Constitutional: He is oriented to person, place, and time. He appears well-developed.  HENT:  Head: Normocephalic.  Right Ear: External ear normal.   Left Ear: External ear normal.  Eyes: Conjunctivae and EOM are normal.  Neck: Normal range of motion.  Cardiovascular: Normal rate and normal heart sounds.   Pulmonary/Chest: Effort normal and breath sounds normal.  Abdominal: Soft. Bowel sounds are normal. He exhibits no distension. There is no tenderness. There is no rebound.  Musculoskeletal: Normal range of motion. He exhibits no edema and no tenderness.  Neurological: He is alert and oriented to person, place, and time.  Psychiatric: He has a normal mood and affect. His behavior is normal.          Assessment & Plan:   Chest pain syndrome seems most compatible with symptomatic GERD. We'll treat with aggressive anti-GERD regimen as well as Dexilant If unimproved we'll consider EGD.

## 2011-10-23 ENCOUNTER — Encounter: Payer: Self-pay | Admitting: *Deleted

## 2011-10-24 ENCOUNTER — Ambulatory Visit (INDEPENDENT_AMBULATORY_CARE_PROVIDER_SITE_OTHER): Payer: Managed Care, Other (non HMO) | Admitting: Cardiovascular Disease

## 2011-10-24 ENCOUNTER — Encounter: Payer: Self-pay | Admitting: Cardiovascular Disease

## 2011-10-24 VITALS — BP 150/94 | HR 65 | Wt 193.0 lb

## 2011-10-24 DIAGNOSIS — E785 Hyperlipidemia, unspecified: Secondary | ICD-10-CM

## 2011-10-24 DIAGNOSIS — I10 Essential (primary) hypertension: Secondary | ICD-10-CM

## 2011-10-24 DIAGNOSIS — R079 Chest pain, unspecified: Secondary | ICD-10-CM

## 2011-10-24 DIAGNOSIS — Z8679 Personal history of other diseases of the circulatory system: Secondary | ICD-10-CM

## 2011-10-24 NOTE — Assessment & Plan Note (Signed)
Well controlled.  Continue current medications and low sodium Dash type diet.    

## 2011-10-24 NOTE — Patient Instructions (Signed)
Your physician recommends that you schedule a follow-up appointment in: as needed  Your physician recommends that you continue on your current medications as directed. Please refer to the Current Medication list given to you today. Your physician has requested that you have cardiac CT. Cardiac computed tomography (CT) is a painless test that uses an x-ray machine to take clear, detailed pictures of your heart. For further information please visit [email or web address] Please follow instruction sheet as given.  DX  CHEST PAIN  HX PERICARDITIS

## 2011-10-24 NOTE — Progress Notes (Signed)
Patient ID: Jacob Arnold, male   DOB: April 01, 1961, 51 y.o.   MRN: [id number] 51 year old patient who has a history of ongoing tobacco use treated hypertension and dyslipidemia who was stable until 3 weeks ago  when he was awoken with significant substernal chest pain at 4:30 AM. Referred by Dr Kirtland Bouchard for evaluation Pain was moderately severe and described as both a sharp and burning quality. It radiated out to the chest wall and arm regions. Initially felt this was heartburn but took Pepto-Bismol without benefit. This usually has been helpful in the past. He then became much more anxious and diaphoretic and was evaluated in the emergency room. He denies any history of exertional chest pain. Was Rx for pericarditis 11 years ago with no recurrence  He has been out of insurance for a few months and has been out of both chlorthalidone and lisinopril but continues to take Normodyne. All medications were refilled recently.  Smokes a ppd. Counseled for less than 10 minutes on cessation.   Has tried welbutrin and chantix before  ED evaluation included an EKG that was normal initial cardiac enzymes normal. EMS was called and after nitroglycerin sublingual pain resolved in 2 minutes. It later recurred in route to the hospital and a second nitroglycerin relieved his pain    Unfortunately he refused to go into the CDU chest pain protocol and did not have CT or stress testing during ER evaluation.  Laboratory studies were reviewed. These are consistent with chronic kidney disease and possible impaired glucose tolerance creatinine 1.4p; random blood sugar 120.  No family history of premature heart disease father is living at 8; mother died at 70 of complications from breast cancer  Lipid profile from 1 year ago reviewed. Total cholesterol 236  Since D/C put on nexium with some relief of pain.  Still getting some central chest pressure.  Usually in am or with stress.  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  All other systems reviewed and negative   General: Affect appropriate Healthy:  appears stated age HEENT: normal Neck supple with no adenopathy JVP normal no bruits no thyromegaly Lungs clear with no wheezing and good diaphragmatic motion Heart:  S1/S2 no murmur,rub, gallop or click PMI normal Abdomen: benighn, BS positve, no tenderness, no AAA no bruit.  No HSM or HJR Distal pulses intact with no bruits No edema Neuro non-focal Skin warm and dry No muscular weakness  Medications Current Outpatient Prescriptions  Medication Sig Dispense Refill  . aspirin 81 MG EC tablet Take 1 tablet (81 mg total) by mouth daily. Swallow whole.  30 tablet  12  . chlorthalidone (HYGROTON) 25 MG tablet Take 1 tablet (25 mg total) by mouth daily.  90 tablet  0  . labetalol (NORMODYNE) 200 MG tablet Take 2 tablets (400 mg total) by mouth 2 (two) times daily.  180 tablet  4  . lisinopril (PRINIVIL,ZESTRIL) 40 MG tablet Take 1 tablet (40 mg total) by mouth daily.  90 tablet  0  . nitroGLYCERIN (NITROSTAT) 0.4 MG SL tablet Place 1 tablet (0.4 mg total) under the tongue every 5 (five) minutes as needed for chest pain.  50 tablet  3  . Omega-3 Fatty Acids (FISH OIL) 1200 MG CAPS Take by mouth daily.        . simvastatin (  ZOCOR) 20 MG tablet Take 40 mg by mouth at bedtime.      Marland Kitchen DISCONTD: simvastatin (ZOCOR) 20 MG tablet Take 1 tablet (20 mg total) by mouth at bedtime.  90 tablet  4    Allergies Diphenhydramine hcl  Family History: Family History  Problem Relation Age of Onset  . Hyperlipidemia Neg Hx     family hx  . Heart disease Neg Hx     family hx  . Hypertension Neg Hx     family hx  . Cancer Neg Hx     family hx    Social History: History   Social History   . Marital Status: Married    Spouse Name: N/A    Number of Children: N/A  . Years of Education: N/A   Occupational History  . Not on file.   Social History Main Topics  . Smoking status: Current Everyday Smoker    Types: Cigarettes  . Smokeless tobacco: Never Used  . Alcohol Use: Yes  . Drug Use: No  . Sexually Active: Not on file   Other Topics Concern  . Not on file   Social History Narrative  . No narrative on file    Electrocardiogram:  6/15  NSR PR 216 LAFB no acute ischemia  Assessment and Plan

## 2011-10-24 NOTE — Assessment & Plan Note (Signed)
Chest pain requiring ER visit not completely relieved with nexium and reurrent.  History of pericarditis.  Minor abnormalities on ECG Prefer cardiac CT as a way to R/O CAD to image pericardium as well.  If not approved will consider stress echo

## 2011-10-24 NOTE — Assessment & Plan Note (Signed)
Resumed meds in June  F/U labs with Dr Kirtland Bouchard

## 2011-10-30 ENCOUNTER — Other Ambulatory Visit: Payer: Self-pay | Admitting: Cardiovascular Disease

## 2011-10-30 DIAGNOSIS — R079 Chest pain, unspecified: Secondary | ICD-10-CM

## 2011-11-06 ENCOUNTER — Ambulatory Visit (HOSPITAL_COMMUNITY): Payer: Managed Care, Other (non HMO) | Attending: Cardiology

## 2011-11-06 ENCOUNTER — Encounter: Payer: Self-pay | Admitting: Cardiovascular Disease

## 2011-11-06 ENCOUNTER — Ambulatory Visit (HOSPITAL_COMMUNITY): Payer: Managed Care, Other (non HMO) | Attending: Cardiovascular Disease | Admitting: Radiology

## 2011-11-06 DIAGNOSIS — R0609 Other forms of dyspnea: Secondary | ICD-10-CM | POA: Insufficient documentation

## 2011-11-06 DIAGNOSIS — R079 Chest pain, unspecified: Secondary | ICD-10-CM

## 2011-11-06 DIAGNOSIS — I319 Disease of pericardium, unspecified: Secondary | ICD-10-CM | POA: Insufficient documentation

## 2011-11-06 DIAGNOSIS — R0989 Other specified symptoms and signs involving the circulatory and respiratory systems: Secondary | ICD-10-CM

## 2011-11-06 DIAGNOSIS — I10 Essential (primary) hypertension: Secondary | ICD-10-CM | POA: Insufficient documentation

## 2011-11-06 DIAGNOSIS — R072 Precordial pain: Secondary | ICD-10-CM | POA: Insufficient documentation

## 2011-11-06 DIAGNOSIS — F17200 Nicotine dependence, unspecified, uncomplicated: Secondary | ICD-10-CM | POA: Insufficient documentation

## 2011-11-06 DIAGNOSIS — E785 Hyperlipidemia, unspecified: Secondary | ICD-10-CM | POA: Insufficient documentation

## 2011-11-06 NOTE — Progress Notes (Signed)
Stress echocardiogram performed. 

## 2011-11-30 ENCOUNTER — Other Ambulatory Visit: Payer: Self-pay | Admitting: Internal Medicine

## 2011-11-30 MED ORDER — ESOMEPRAZOLE MAGNESIUM 40 MG PO CPDR: 40.0000 mg | DELAYED_RELEASE_CAPSULE | Freq: Every day | ORAL | Status: DC

## 2011-11-30 NOTE — Telephone Encounter (Signed)
Pt was given samples of nexium 40mg . Pt needs new rx sent [facility] [address]

## 2012-01-03 ENCOUNTER — Other Ambulatory Visit: Payer: Managed Care, Other (non HMO)

## 2012-01-10 ENCOUNTER — Encounter: Payer: Managed Care, Other (non HMO) | Admitting: Internal Medicine

## 2012-02-06 ENCOUNTER — Other Ambulatory Visit (INDEPENDENT_AMBULATORY_CARE_PROVIDER_SITE_OTHER): Payer: Managed Care, Other (non HMO)

## 2012-02-06 DIAGNOSIS — Z0000 Encounter for general adult medical examination without abnormal findings: Secondary | ICD-10-CM

## 2012-02-06 LAB — HEPATIC FUNCTION PANEL
Alkaline Phosphatase: 88 U/L (ref 39–117)
Bilirubin, Direct: 0.2 mg/dL (ref 0.0–0.3)
Total Protein: 7.6 g/dL (ref 6.0–8.3)

## 2012-02-06 LAB — CBC WITH DIFFERENTIAL/PLATELET
Basophils Absolute: 0 10*3/uL (ref 0.0–0.1)
Eosinophils Absolute: 1.1 10*3/uL — ABNORMAL HIGH (ref 0.0–0.7)
HCT: 54.1 % — ABNORMAL HIGH (ref 39.0–52.0)
Hemoglobin: 17.9 g/dL — ABNORMAL HIGH (ref 13.0–17.0)
Lymphs Abs: 2.7 10*3/uL (ref 0.7–4.0)
MCHC: 33 g/dL (ref 30.0–36.0)
Monocytes Absolute: 0.9 10*3/uL (ref 0.1–1.0)
Neutro Abs: 8.6 10*3/uL — ABNORMAL HIGH (ref 1.4–7.7)
RDW: 14.3 % (ref 11.5–14.6)

## 2012-02-06 LAB — BASIC METABOLIC PANEL
Calcium: 10 mg/dL (ref 8.4–10.5)
Creatinine, Ser: 1.9 mg/dL — ABNORMAL HIGH (ref 0.4–1.5)

## 2012-02-06 LAB — POCT URINALYSIS DIPSTICK
Bilirubin, UA: NEGATIVE
Ketones, UA: NEGATIVE
Leukocytes, UA: NEGATIVE
pH, UA: 5.5

## 2012-02-06 LAB — LIPID PANEL
Cholesterol: 225 mg/dL — ABNORMAL HIGH (ref 0–200)
Triglycerides: 514 mg/dL — ABNORMAL HIGH (ref 0.0–149.0)

## 2012-02-13 ENCOUNTER — Ambulatory Visit (INDEPENDENT_AMBULATORY_CARE_PROVIDER_SITE_OTHER): Payer: Managed Care, Other (non HMO) | Admitting: Internal Medicine

## 2012-02-13 ENCOUNTER — Encounter: Payer: Self-pay | Admitting: Internal Medicine

## 2012-02-13 VITALS — BP 126/86 | HR 74 | Temp 97.9°F | Resp 18 | Ht 71.0 in | Wt 192.0 lb

## 2012-02-13 DIAGNOSIS — N189 Chronic kidney disease, unspecified: Secondary | ICD-10-CM

## 2012-02-13 DIAGNOSIS — K219 Gastro-esophageal reflux disease without esophagitis: Secondary | ICD-10-CM

## 2012-02-13 DIAGNOSIS — Z0000 Encounter for general adult medical examination without abnormal findings: Secondary | ICD-10-CM

## 2012-02-13 DIAGNOSIS — N183 Chronic kidney disease, stage 3 unspecified: Secondary | ICD-10-CM | POA: Insufficient documentation

## 2012-02-13 DIAGNOSIS — D45 Polycythemia vera: Secondary | ICD-10-CM

## 2012-02-13 DIAGNOSIS — D751 Secondary polycythemia: Secondary | ICD-10-CM | POA: Insufficient documentation

## 2012-02-13 DIAGNOSIS — I10 Essential (primary) hypertension: Secondary | ICD-10-CM

## 2012-02-13 MED ORDER — SIMVASTATIN 40 MG PO TABS: 40.0000 mg | ORAL_TABLET | Freq: Every day | ORAL | Status: DC

## 2012-02-13 NOTE — Progress Notes (Signed)
Subjective:    Patient ID: Jacob Arnold, male    DOB: 21-Jul-1960, 51 y.o.   MRN: [id number]  HPI  51 year old patient who is seen today for a health maintenance examination. He has had a recent history of chest pain. He has been seen by cardiology and has had a negative stress echocardiogram. He has had no recurrent chest pain since being placed on Nexium. He has treated hypertension and also a history of dyslipidemia. He has remote history of pericarditis. He states that this was consultative by acute renal failure at that time. Creatinine has fluctuated over the years and he states that he has been evaluated by nephrology locally [place]. Recent creatinine up to 1.9 He has a long history of intermittent polycythemia. He is a tobacco user but no history of hypoxemia  Past Medical History  Diagnosis Date  . HYPERLIPIDEMIA   . HYPERTENSION   . Tobacco abuse   . ED (erectile dysfunction)     History   Social History  . Marital Status: Married    Spouse Name: N/A    Number of Children: N/A  . Years of Education: N/A   Occupational History  . Not on file.   Social History Main Topics  . Smoking status: Current Every Day Smoker    Types: Cigarettes  . Smokeless tobacco: Never Used  . Alcohol Use: Yes  . Drug Use: No  . Sexually Active: Not on file   Other Topics Concern  . Not on file   Social History Narrative  . No narrative on file    Past Surgical History  Procedure Date  . Tonsillectomy   . Vasectomy     Family History  Problem Relation Age of Onset  . Hyperlipidemia Neg Hx     family hx  . Heart disease Neg Hx     family hx  . Hypertension Neg Hx     family hx  . Cancer Neg Hx     family hx    Allergies  Allergen Reactions  . Diphenhydramine Hcl Itching and Swelling    Current Outpatient Prescriptions on File Prior to Visit  Medication Sig Dispense Refill  . aspirin 81 MG EC tablet Take 1 tablet (81 mg total)  by mouth daily. Swallow whole.  30 tablet  12  . chlorthalidone (HYGROTON) 25 MG tablet Take 1 tablet (25 mg total) by mouth daily.  90 tablet  0  . esomeprazole (NEXIUM) 40 MG capsule Take 1 capsule (40 mg total) by mouth daily.  90 capsule  1  . labetalol (NORMODYNE) 200 MG tablet Take 2 tablets (400 mg total) by mouth 2 (two) times daily.  180 tablet  4  . lisinopril (PRINIVIL,ZESTRIL) 40 MG tablet Take 1 tablet (40 mg total) by mouth daily.  90 tablet  0  . nitroGLYCERIN (NITROSTAT) 0.4 MG SL tablet Place 1 tablet (0.4 mg total) under the tongue every 5 (five) minutes as needed for chest pain.  50 tablet  3  . Omega-3 Fatty Acids (FISH OIL) 1200 MG CAPS Take by mouth daily.          BP 126/86  Pulse 74  Temp 97.9 F (36.6 C) (Oral)  Resp 18  Ht 5\' 11"  (1.803 m)  Wt 192 lb (87.091 kg)  BMI 26.78 kg/m2  SpO2 97%       Review of Systems  Constitutional: Negative for fever, chills, activity change, appetite change and fatigue.  HENT:  Negative for hearing loss, ear pain, congestion, rhinorrhea, sneezing, mouth sores, trouble swallowing, neck pain, neck stiffness, dental problem, voice change, sinus pressure and tinnitus.   Eyes: Negative for photophobia, pain, redness and visual disturbance.  Respiratory: Negative for apnea, cough, choking, chest tightness, shortness of breath and wheezing.   Cardiovascular: Negative for chest pain, palpitations and leg swelling.  Gastrointestinal: Negative for nausea, vomiting, abdominal pain, diarrhea, constipation, blood in stool, abdominal distention, anal bleeding and rectal pain.  Genitourinary: Negative for dysuria, urgency, frequency, hematuria, flank pain, decreased urine volume, discharge, penile swelling, scrotal swelling, difficulty urinating, genital sores and testicular pain.  Musculoskeletal: Negative for myalgias, back pain, joint swelling, arthralgias and gait problem.  Skin: Negative for color change, rash and wound.  Neurological:  Negative for dizziness, tremors, seizures, syncope, facial asymmetry, speech difficulty, weakness, light-headedness, numbness and headaches.  Hematological: Negative for adenopathy. Does not bruise/bleed easily.  Psychiatric/Behavioral: Negative for suicidal ideas, hallucinations, behavioral problems, confusion, disturbed wake/sleep cycle, self-injury, dysphoric mood, decreased concentration and agitation. The patient is not nervous/anxious.        Objective:   Physical Exam  Constitutional: He appears well-developed and well-nourished.  HENT:  Head: Normocephalic and atraumatic.  Right Ear: External ear normal.  Left Ear: External ear normal.  Nose: Nose normal.  Mouth/Throat: Oropharynx is clear and moist.  Eyes: Conjunctivae normal and EOM are normal. Pupils are equal, round, and reactive to light. No scleral icterus.  Neck: Normal range of motion. Neck supple. No JVD present. No thyromegaly present.  Cardiovascular: Regular rhythm, normal heart sounds and intact distal pulses.  Exam reveals no gallop and no friction rub.   No murmur heard. Pulmonary/Chest: Effort normal and breath sounds normal. He exhibits no tenderness.  Abdominal: Soft. Bowel sounds are normal. He exhibits no distension and no mass. There is no tenderness.  Genitourinary: Prostate normal and penis normal.  Musculoskeletal: Normal range of motion. He exhibits no edema and no tenderness.  Lymphadenopathy:    He has no cervical adenopathy.  Neurological: He is alert. He has normal reflexes. No cranial nerve deficit. Coordination normal.  Skin: Skin is warm and dry. No rash noted.  Psychiatric: He has a normal mood and affect. His behavior is normal.          Assessment & Plan:   Chronic kidney disease. Will refer back to nephrology with increased creatinine now at 1.9 Hypertension fair control blood pressure 130/86. Patient does monitor home blood pressure readings often in a low-normal range Polycythemia  doubt this represents polycythemia vera although with elevated WBC somewhat disconcerting. Hematocrit level varies and has been high normal in the past. O2 saturation today 97%  Total cessation of tobacco products encouraged Nephrology referral

## 2012-02-13 NOTE — Patient Instructions (Signed)
Limit your sodium (Salt) intake    It is important that you exercise regularly, at least 20 minutes 3 to 4 times per week.  If you develop chest pain or shortness of breath seek  medical attention.  Avoids foods high in acid such as tomatoes citrus juices, and spicy foods.  Avoid eating within two hours of lying down or before exercising.  Do not overheat.  Try smaller more frequent meals.  If symptoms persist, elevate the head of her bed 12 inches while sleeping.  Return in 6 months for follow-up  Nephrology consultation  Schedule your colonoscopy to help detect colon cancer.

## 2012-02-28 ENCOUNTER — Encounter: Payer: Self-pay | Admitting: Internal Medicine

## 2012-03-29 ENCOUNTER — Ambulatory Visit (AMBULATORY_SURGERY_CENTER): Payer: Managed Care, Other (non HMO)

## 2012-03-29 VITALS — Ht 71.0 in | Wt 190.4 lb

## 2012-03-29 DIAGNOSIS — Z1211 Encounter for screening for malignant neoplasm of colon: Secondary | ICD-10-CM

## 2012-03-29 MED ORDER — MOVIPREP 100 G PO SOLR: ORAL | Status: DC

## 2012-04-12 ENCOUNTER — Ambulatory Visit (AMBULATORY_SURGERY_CENTER): Payer: Managed Care, Other (non HMO) | Admitting: Internal Medicine

## 2012-04-12 ENCOUNTER — Encounter: Payer: Self-pay | Admitting: Internal Medicine

## 2012-04-12 VITALS — BP 121/80 | HR 65 | Temp 97.6°F | Resp 15 | Ht 71.0 in | Wt 190.0 lb

## 2012-04-12 DIAGNOSIS — D126 Benign neoplasm of colon, unspecified: Secondary | ICD-10-CM

## 2012-04-12 DIAGNOSIS — Z1211 Encounter for screening for malignant neoplasm of colon: Secondary | ICD-10-CM

## 2012-04-12 DIAGNOSIS — K635 Polyp of colon: Secondary | ICD-10-CM

## 2012-04-12 MED ORDER — SODIUM CHLORIDE 0.9 % IV SOLN: 500.0000 mL | INTRAVENOUS | Status: DC

## 2012-04-12 NOTE — Patient Instructions (Addendum)
YOU HAD AN ENDOSCOPIC PROCEDURE TODAY AT [facility]: Refer to the procedure report that was given to you for any specific questions about what was found during the examination.  If the procedure report does not answer your questions, please call your gastroenterologist to clarify.  If you requested that your care partner not be given the details of your procedure findings, then the procedure report has been included in a sealed envelope for you to review at your convenience later.  YOU SHOULD EXPECT: Some feelings of bloating in the abdomen. Passage of more gas than usual.  Walking can help get rid of the air that was put into your GI tract during the procedure and reduce the bloating. If you had a lower endoscopy (such as a colonoscopy or flexible sigmoidoscopy) you may notice spotting of blood in your stool or on the toilet paper. If you underwent a bowel prep for your procedure, then you may not have a normal bowel movement for a few days.  DIET: Your first meal following the procedure should be a light meal and then it is ok to progress to your normal diet.  A half-sandwich or bowl of soup is an example of a good first meal.  Heavy or fried foods are harder to digest and may make you feel nauseous or bloated.  Likewise meals heavy in dairy and vegetables can cause extra gas to form and this can also increase the bloating.  Drink plenty of fluids but you should avoid alcoholic beverages for 24 hours.  ACTIVITY: Your care partner should take you home directly after the procedure.  You should plan to take it easy, moving slowly for the rest of the day.  You can resume normal activity the day after the procedure however you should NOT DRIVE or use heavy machinery for 24 hours (because of the sedation medicines used during the test).    SYMPTOMS TO REPORT IMMEDIATELY: A gastroenterologist can be reached at any hour.  During normal business hours, 8:30 AM to 5:00 PM Monday through Friday,  call [phone].  After hours and on weekends, please call the GI answering service at [phone] who will take a message and have the physician on call contact you.   Following lower endoscopy (colonoscopy or flexible sigmoidoscopy):  Excessive amounts of blood in the stool  Significant tenderness or worsening of abdominal pains  Swelling of the abdomen that is new, acute  Fever of 100F or higher   FOLLOW UP: If any biopsies were taken you will be contacted by phone or by letter within the next 1-3 weeks.  Call your gastroenterologist if you have not heard about the biopsies in 3 weeks.  Our staff will call the home number listed on your records the next business day following your procedure to check on you and address any questions or concerns that you may have at that time regarding the information given to you following your procedure. This is a courtesy call and so if there is no answer at the home number and we have not heard from you through the emergency physician on call, we will assume that you have returned to your regular daily activities without incident.  SIGNATURES/CONFIDENTIALITY: You and/or your care partner have signed paperwork which will be entered into your electronic medical record.  These signatures attest to the fact that that the information above on your After Visit Summary has been reviewed and is understood.  Full responsibility of the confidentiality of  this discharge information lies with you and/or your care-partner.   Polyps-handout given  Diverticulosis-handout given  High Fiber Diet-handout given  Repeat 5-10 years depending on pathology

## 2012-04-12 NOTE — Op Note (Signed)
[facility] [address] [place], [address]   COLONOSCOPY PROCEDURE REPORT  PATIENT: Jacob Arnold, Jacob Arnold  MR#: [id number] BIRTHDATE: May 10, 1960 , 51  yrs. old GENDER: Male ENDOSCOPIST: Beverley Fiedler, MD REFERRED [id number], Peter PROCEDURE DATE:  04/12/2012 PROCEDURE:   Colonoscopy with snare polypectomy ASA CLASS:   Class II INDICATIONS:average risk screening and first colonoscopy. MEDICATIONS: MAC sedation, administered by CRNA and propofol (Diprivan) 300mg  IV  DESCRIPTION OF PROCEDURE:   After the risks benefits and alternatives of the procedure were thoroughly explained, informed consent was obtained.  A digital rectal exam revealed no rectal mass.   The LB [id number]  endoscope was introduced through the anus and advanced to the cecum, which was identified by both the appendix and ileocecal valve. No adverse events experienced. The quality of the prep was good, using MoviPrep  The instrument was then slowly withdrawn as the colon was fully examined.   COLON FINDINGS: Two sessile polyps ranging between 3-60mm in size were found in the descending colon and rectosigmoid colon. Polypectomy was performed using cold snare.  All resections were complete and all polyp tissue was completely retrieved.   There was mild scattered diverticulosis noted in the sigmoid colon.   The colon mucosa was otherwise normal.  Retroflexed views revealed no abnormalities. The time to cecum=3 minutes 26 seconds.  Withdrawal time=9 minutes 16 seconds.  The scope was withdrawn and the procedure completed. COMPLICATIONS: There were no complications.  ENDOSCOPIC IMPRESSION: 1.   Two sessile polyps ranging between 3-22mm in size were found in the descending colon and rectosigmoid colon; Polypectomy was performed using cold snare 2.   There was mild diverticulosis noted in the sigmoid colon 3.   The colon mucosa was otherwise normal  RECOMMENDATIONS: 1.  Await pathology  results 2.  High fiber diet 3.  If the polyps removed today are proven to be adenomatous (pre-cancerous) polyps, you will need a repeat colonoscopy in 5 years.  Otherwise you should continue to follow colorectal cancer screening guidelines for "routine risk" patients with colonoscopy in 10 years.  You will receive a letter within 1-2 weeks with the results of your biopsy as well as final recommendations.  Please call my office if you have not received a letter after 3 weeks.   eSigned:  Beverley Fiedler, MD 04/12/2012 9:52 AM       cc: Gordy Savers, MD and The Patient

## 2012-04-12 NOTE — Progress Notes (Signed)
Patient did not experience any of the following events: a burn prior to discharge; a fall within the facility; wrong site/side/patient/procedure/implant event; or a hospital transfer or hospital admission upon discharge from the facility. (G8907) Patient did not have preoperative order for IV antibiotic SSI prophylaxis. (G8918)  

## 2012-04-15 ENCOUNTER — Telehealth: Payer: Self-pay | Admitting: *Deleted

## 2012-04-15 NOTE — Telephone Encounter (Signed)
  Follow up Call-  Call back number 04/12/2012  Post procedure Call Back phone  # [phone]  Permission to leave phone message Yes     Patient questions:  Do you have a fever, pain , or abdominal swelling? no Pain Score  0 *  Have you tolerated food without any problems? yes  Have you been able to return to your normal activities? yes  Do you have any questions about your discharge instructions: Diet   no Medications  no Follow up visit  no  Do you have questions or concerns about your Care? no  Actions: * If pain score is 4 or above: No action needed, pain <4.

## 2012-04-19 ENCOUNTER — Encounter: Payer: Self-pay | Admitting: Internal Medicine

## 2012-08-10 ENCOUNTER — Other Ambulatory Visit: Payer: Self-pay | Admitting: Internal Medicine

## 2012-08-13 ENCOUNTER — Ambulatory Visit: Payer: Managed Care, Other (non HMO) | Admitting: Internal Medicine

## 2012-08-13 DIAGNOSIS — Z0289 Encounter for other administrative examinations: Secondary | ICD-10-CM

## 2012-08-17 IMAGING — CR DG CHEST 1V PORT
1 series · 1 of 1 positions shown · non-contrast
Comparison: None.

CLINICAL DATA: Chest pain.

PORTABLE CHEST - 1 VIEW

[AP]
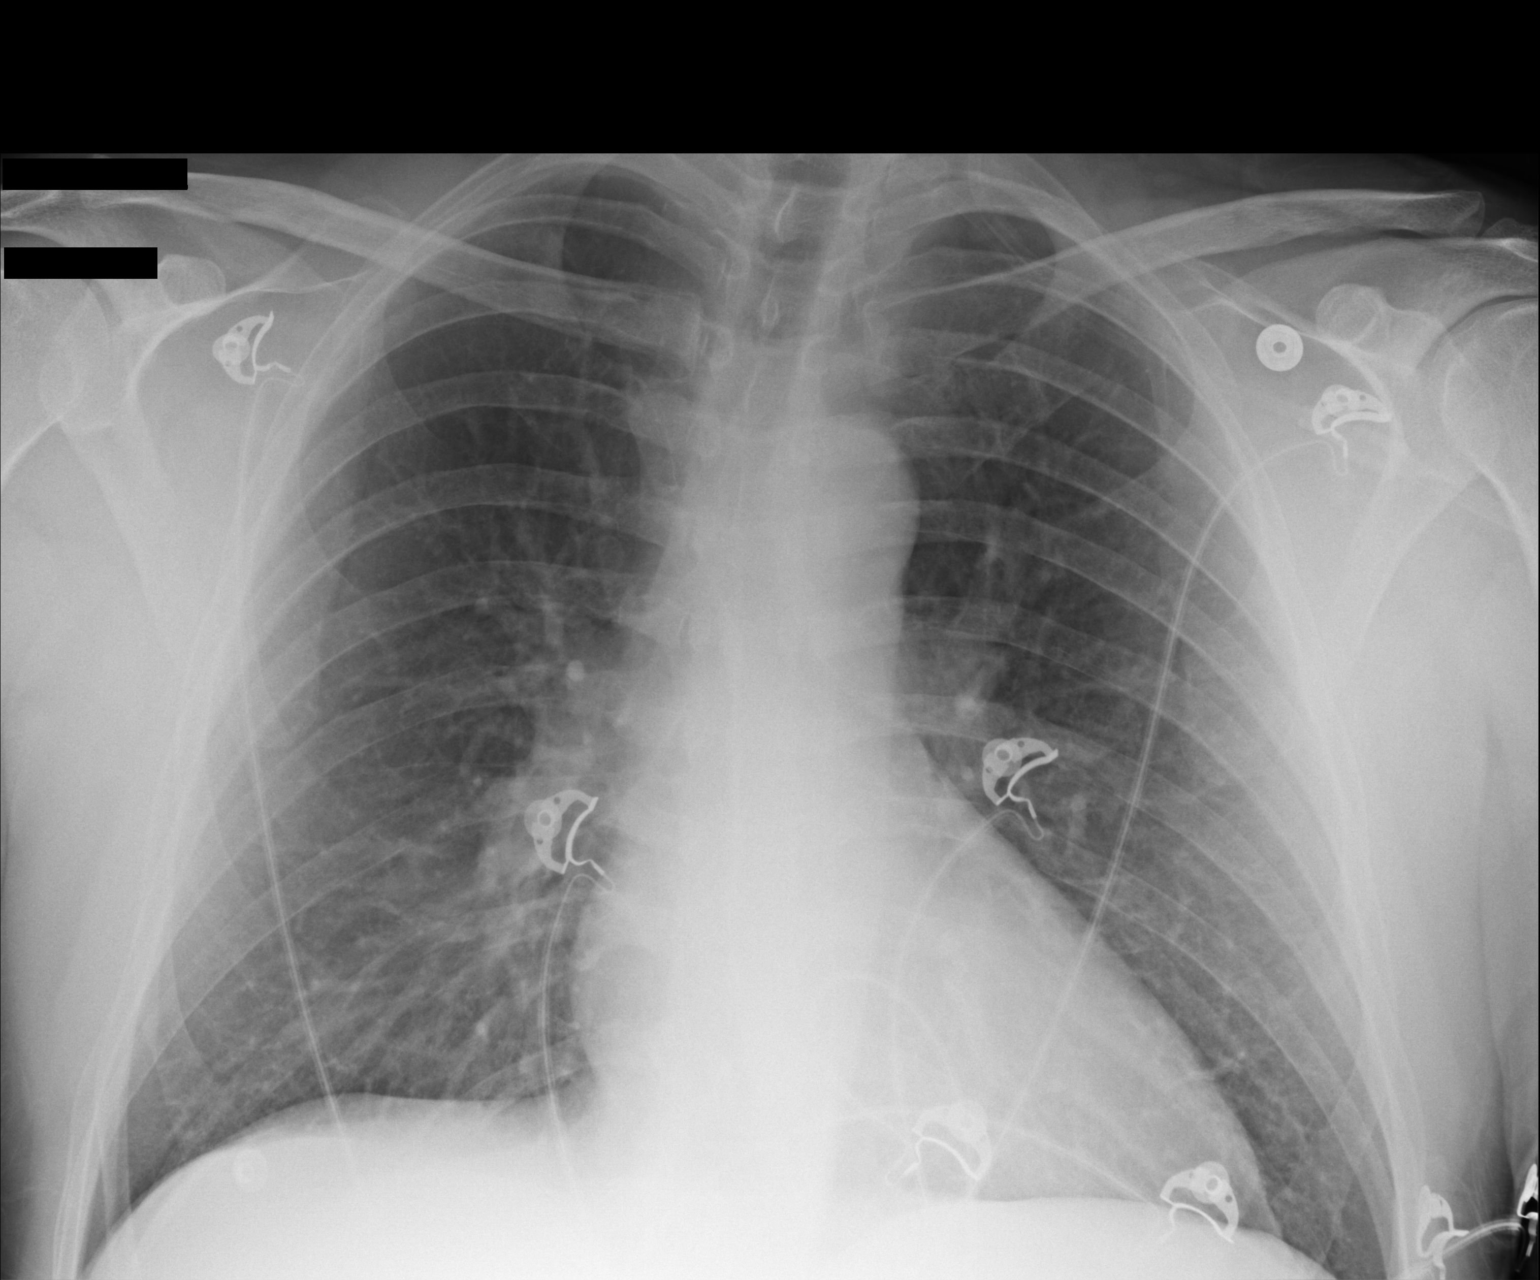

[1 of 1 positions shown; findings below may reference images not displayed]

FINDINGS: Cardiac and mediastinal contours appear normal.  Linear
calcification projecting over the cardiac shadow could possibly
represent coronary artery calcification.

The lungs appear clear.

No pleural effusion is identified.
IMPRESSION: 1.  Linear calcification projecting over the cardiac shadow could
possibly represent coronary artery atherosclerotic calcification.
Otherwise, no significant abnormality identified.

## 2012-08-20 ENCOUNTER — Encounter: Payer: Self-pay | Admitting: Internal Medicine

## 2012-08-20 ENCOUNTER — Ambulatory Visit (INDEPENDENT_AMBULATORY_CARE_PROVIDER_SITE_OTHER): Payer: Managed Care, Other (non HMO) | Admitting: Internal Medicine

## 2012-08-20 VITALS — BP 130/80 | HR 74 | Temp 97.7°F | Resp 18 | Wt 194.0 lb

## 2012-08-20 DIAGNOSIS — N189 Chronic kidney disease, unspecified: Secondary | ICD-10-CM

## 2012-08-20 DIAGNOSIS — I10 Essential (primary) hypertension: Secondary | ICD-10-CM

## 2012-08-20 DIAGNOSIS — F17200 Nicotine dependence, unspecified, uncomplicated: Secondary | ICD-10-CM | POA: Insufficient documentation

## 2012-08-20 DIAGNOSIS — E785 Hyperlipidemia, unspecified: Secondary | ICD-10-CM

## 2012-08-20 DIAGNOSIS — I129 Hypertensive chronic kidney disease with stage 1 through stage 4 chronic kidney disease, or unspecified chronic kidney disease: Secondary | ICD-10-CM

## 2012-08-20 MED ORDER — SIMVASTATIN 40 MG PO TABS: 40.0000 mg | ORAL_TABLET | Freq: Every day | ORAL | Status: DC

## 2012-08-20 MED ORDER — CHLORTHALIDONE 25 MG PO TABS: ORAL_TABLET | ORAL | Status: DC

## 2012-08-20 MED ORDER — LISINOPRIL 40 MG PO TABS: ORAL_TABLET | ORAL | Status: DC

## 2012-08-20 MED ORDER — ESOMEPRAZOLE MAGNESIUM 40 MG PO CPDR: 40.0000 mg | DELAYED_RELEASE_CAPSULE | Freq: Every day | ORAL | Status: DC

## 2012-08-20 MED ORDER — LABETALOL HCL 200 MG PO TABS: 400.0000 mg | ORAL_TABLET | Freq: Two times a day (BID) | ORAL | Status: DC

## 2012-08-20 NOTE — Progress Notes (Signed)
Subjective:    Patient ID: Jacob Arnold, male    DOB: 01-06-1961, 52 y.o.   MRN: [id number]  HPI  52 year old patient seen today for followup. He has hypertension dyslipidemia ongoing tobacco use and chronic kidney disease. 6 months ago creatinine 1.9. He feels well today without concerns or complaints.  Past Medical History  Diagnosis Date  . HYPERLIPIDEMIA   . HYPERTENSION   . Tobacco abuse   . ED (erectile dysfunction)   . Panic attacks     History   Social History  . Marital Status: Married    Spouse Name: N/A    Number of Children: N/A  . Years of Education: N/A   Occupational History  . Not on file.   Social History Main Topics  . Smoking status: Current Every Day Smoker -- 1.00 packs/day for 29 years    Types: Cigarettes  . Smokeless tobacco: Never Used  . Alcohol Use: 3.6 oz/week    6 Cans of beer per week  . Drug Use: No  . Sexually Active: Not on file   Other Topics Concern  . Not on file   Social History Narrative  . No narrative on file    Past Surgical History  Procedure Laterality Date  . Tonsillectomy    . Vasectomy      Family History  Problem Relation Age of Onset  . Hyperlipidemia Neg Hx     family hx  . Heart disease Neg Hx     family hx  . Hypertension Neg Hx     family hx  . Cancer Neg Hx     family hx  . Breast cancer Mother   . Liver cancer Mother   . Diabetes Father     Allergies  Allergen Reactions  . Diphenhydramine Hcl Itching and Swelling    Current Outpatient Prescriptions on File Prior to Visit  Medication Sig Dispense Refill  . aspirin 81 MG EC tablet Take 1 tablet (81 mg total) by mouth daily. Swallow whole.  30 tablet  12  . nitroGLYCERIN (NITROSTAT) 0.4 MG SL tablet Place 1 tablet (0.4 mg total) under the tongue every 5 (five) minutes as needed for chest pain.  50 tablet  3  . Omega-3 Fatty Acids (FISH OIL) 1200 MG CAPS Take by mouth daily.        . Red Yeast Rice Extract (RED YEAST RICE PO) Take 500 mg by  mouth daily.       No current facility-administered medications on file prior to visit.    BP 130/80  Pulse 74  Temp(Src) 97.7 F (36.5 C) (Oral)  Resp 18  Wt 194 lb (87.998 kg)  BMI 27.07 kg/m2  SpO2 95%       Review of Systems  Constitutional: Negative for fever, chills, appetite change and fatigue.  HENT: Negative for hearing loss, ear pain, congestion, sore throat, trouble swallowing, neck stiffness, dental problem, voice change and tinnitus.   Eyes: Negative for pain, discharge and visual disturbance.  Respiratory: Negative for cough, chest tightness, wheezing and stridor.   Cardiovascular: Negative for chest pain, palpitations and leg swelling.  Gastrointestinal: Negative for nausea, vomiting, abdominal pain, diarrhea, constipation, blood in stool and abdominal distention.  Genitourinary: Negative for urgency, hematuria, flank pain, discharge, difficulty urinating and genital sores.  Musculoskeletal: Negative for myalgias, back pain, joint swelling, arthralgias and gait problem.  Skin: Negative for rash.  Neurological: Negative for dizziness, syncope, speech difficulty, weakness, numbness and headaches.  Hematological: Negative for  adenopathy. Does not bruise/bleed easily.  Psychiatric/Behavioral: Negative for behavioral problems and dysphoric mood. The patient is not nervous/anxious.        Objective:   Physical Exam  Constitutional: He is oriented to person, place, and time. He appears well-developed.  Blood pressure 118/78  HENT:  Head: Normocephalic.  Right Ear: External ear normal.  Left Ear: External ear normal.  Eyes: Conjunctivae and EOM are normal.  Neck: Normal range of motion.  Cardiovascular: Normal rate and normal heart sounds.   Pulmonary/Chest: Breath sounds normal.  Abdominal: Bowel sounds are normal.  Musculoskeletal: Normal range of motion. He exhibits no edema and no tenderness.  Neurological: He is alert and oriented to person, place, and  time.  Psychiatric: He has a normal mood and affect. His behavior is normal.          Assessment & Plan:   Hypertension. Well controlled on triple therapy Dyslipidemia. Continue simvastatin 40 Tobacco use. Smoking cessation encouraged Chronic kidney disease. Last creatinine 1.9. Will repeat today  Recheck 6 months Consider nephrology referral

## 2012-08-20 NOTE — Patient Instructions (Signed)
Limit your sodium (Salt) intake    It is important that you exercise regularly, at least 20 minutes 3 to 4 times per week.  If you develop chest pain or shortness of breath seek  medical attention.  Smoking tobacco is very bad for your health. You should stop smoking immediately.  Please check your blood pressure on a regular basis.  If it is consistently greater than 150/90, please make an office appointment.  Return in 6 months for follow-up  

## 2013-02-13 ENCOUNTER — Telehealth: Payer: Self-pay | Admitting: *Deleted

## 2013-02-13 ENCOUNTER — Other Ambulatory Visit (INDEPENDENT_AMBULATORY_CARE_PROVIDER_SITE_OTHER): Payer: Managed Care, Other (non HMO)

## 2013-02-13 DIAGNOSIS — Z0000 Encounter for general adult medical examination without abnormal findings: Secondary | ICD-10-CM

## 2013-02-13 LAB — HEPATIC FUNCTION PANEL
Alkaline Phosphatase: 103 U/L (ref 39–117)
Bilirubin, Direct: 0.1 mg/dL (ref 0.0–0.3)
Total Protein: 7.4 g/dL (ref 6.0–8.3)

## 2013-02-13 LAB — POCT URINALYSIS DIPSTICK
Bilirubin, UA: NEGATIVE
Ketones, UA: NEGATIVE
Nitrite, UA: NEGATIVE
Protein, UA: NEGATIVE
Urobilinogen, UA: 1
pH, UA: 6

## 2013-02-13 LAB — CBC WITH DIFFERENTIAL/PLATELET
Basophils Relative: 0.5 % (ref 0.0–3.0)
Eosinophils Absolute: 0.7 10*3/uL (ref 0.0–0.7)
Eosinophils Relative: 6.8 % — ABNORMAL HIGH (ref 0.0–5.0)
Lymphocytes Relative: 33 % (ref 12.0–46.0)
MCHC: 34.3 g/dL (ref 30.0–36.0)
Neutrophils Relative %: 51.8 % (ref 43.0–77.0)
RBC: 5.48 Mil/uL (ref 4.22–5.81)
WBC: 10.3 10*3/uL (ref 4.5–10.5)

## 2013-02-13 LAB — LIPID PANEL
Cholesterol: 210 mg/dL — ABNORMAL HIGH (ref 0–200)
Total CHOL/HDL Ratio: 6

## 2013-02-13 LAB — PSA: PSA: 4.47 ng/mL — ABNORMAL HIGH (ref 0.10–4.00)

## 2013-02-13 LAB — BASIC METABOLIC PANEL
Calcium: 9.8 mg/dL (ref 8.4–10.5)
Creatinine, Ser: 1.5 mg/dL (ref 0.4–1.5)

## 2013-02-13 NOTE — Telephone Encounter (Signed)
Received phone call from [facility] with critical lab result on pt Hemoglobin was 18. Told her okay thanks. Clydie Braun said will fax result over.  Dr. Amador Cunas notified verbally and message sent to him.

## 2013-02-20 ENCOUNTER — Encounter: Payer: Self-pay | Admitting: Internal Medicine

## 2013-02-20 ENCOUNTER — Ambulatory Visit (INDEPENDENT_AMBULATORY_CARE_PROVIDER_SITE_OTHER): Payer: Managed Care, Other (non HMO) | Admitting: Internal Medicine

## 2013-02-20 VITALS — BP 120/76 | HR 73 | Temp 97.9°F | Resp 20 | Ht 70.0 in | Wt 185.0 lb

## 2013-02-20 DIAGNOSIS — I10 Essential (primary) hypertension: Secondary | ICD-10-CM

## 2013-02-20 DIAGNOSIS — Z23 Encounter for immunization: Secondary | ICD-10-CM

## 2013-02-20 DIAGNOSIS — D45 Polycythemia vera: Secondary | ICD-10-CM

## 2013-02-20 DIAGNOSIS — Z860100 Personal history of colon polyps, unspecified: Secondary | ICD-10-CM

## 2013-02-20 DIAGNOSIS — D751 Secondary polycythemia: Secondary | ICD-10-CM

## 2013-02-20 DIAGNOSIS — N189 Chronic kidney disease, unspecified: Secondary | ICD-10-CM

## 2013-02-20 DIAGNOSIS — E785 Hyperlipidemia, unspecified: Secondary | ICD-10-CM

## 2013-02-20 DIAGNOSIS — F17200 Nicotine dependence, unspecified, uncomplicated: Secondary | ICD-10-CM

## 2013-02-20 DIAGNOSIS — R972 Elevated prostate specific antigen [PSA]: Secondary | ICD-10-CM

## 2013-02-20 MED ORDER — ASPIRIN 81 MG PO TABS: 81.0000 mg | ORAL_TABLET | Freq: Every day | ORAL | Status: AC

## 2013-02-20 MED ORDER — ATORVASTATIN CALCIUM 80 MG PO TABS: 80.0000 mg | ORAL_TABLET | Freq: Every day | ORAL | Status: DC

## 2013-02-20 NOTE — Patient Instructions (Signed)
Have your PSA checked in 3-4 months    It is important that you exercise regularly, at least 20 minutes 3 to 4 times per week.  If you develop chest pain or shortness of breath seek  medical attention.  Smoking tobacco is very bad for your health. You should stop smoking immediately.  Return in one year for follow-up

## 2013-02-20 NOTE — Progress Notes (Signed)
Patient ID: Jacob Arnold, male   DOB: 02-10-1961, 52 y.o.   MRN: [id number]  Subjective:    Patient ID: Jacob Arnold, male    DOB: Jan 18, 1961, 52 y.o.   MRN: [id number]  HPI  52 year old patient who is seen today for a health maintenance examination. He has had a  history of chest pain. He has been seen by cardiology and has had a negative stress echocardiogram last year. He has had no recurrent chest pain since being placed on Nexium. He has treated hypertension and also a history of dyslipidemia. He has remote history of pericarditis. He states that this was complicated by acute renal failure at that time. Creatinine has fluctuated over the years and he states that he has been evaluated by nephrology [place]. Recent creatinine down to 1.5 He has a long history of intermittent polycythemia. He is a tobacco user but no history of hypoxemia He did have a screening colonoscopy last year that revealed colonic polyps  Past Medical History  Diagnosis Date  . HYPERLIPIDEMIA   . HYPERTENSION   . Tobacco abuse   . ED (erectile dysfunction)   . Panic attacks     History   Social History  . Marital Status: Married    Spouse Name: N/A    Number of Children: N/A  . Years of Education: N/A   Occupational History  . Not on file.   Social History Main Topics  . Smoking status: Current Every Day Smoker -- 1.00 packs/day for 29 years    Types: Cigarettes  . Smokeless tobacco: Never Used  . Alcohol Use: 3.6 oz/week    6 Cans of beer per week  . Drug Use: No  . Sexual Activity: Not on file   Other Topics Concern  . Not on file   Social History Narrative  . No narrative on file    Past Surgical History  Procedure Laterality Date  . Tonsillectomy    . Vasectomy      Family History  Problem Relation Age of Onset  . Hyperlipidemia Neg Hx     family hx  . Heart disease Neg Hx     family hx  . Hypertension Neg Hx     family hx  .  Cancer Neg Hx     family hx  . Breast cancer Mother   . Liver cancer Mother   . Diabetes Father     Allergies  Allergen Reactions  . Diphenhydramine Hcl Itching and Swelling    Current Outpatient Prescriptions on File Prior to Visit  Medication Sig Dispense Refill  . chlorthalidone (HYGROTON) 25 MG tablet TAKE 1 TABLET BY MOUTH ONCE DAILY  30 tablet  5  . esomeprazole (NEXIUM) 40 MG capsule Take 1 capsule (40 mg total) by mouth daily.  30 capsule  5  . labetalol (NORMODYNE) 200 MG tablet Take 2 tablets (400 mg total) by mouth 2 (two) times daily.  60 tablet  5  . lisinopril (PRINIVIL,ZESTRIL) 40 MG tablet TAKE 1 TABLET BY MOUTH ONCE DAILY  30 tablet  5  . Omega-3 Fatty Acids (FISH OIL) 1200 MG CAPS Take by mouth daily.        . simvastatin (ZOCOR) 40 MG tablet Take 1 tablet (40 mg total) by mouth at bedtime.  30 tablet  5  . nitroGLYCERIN (NITROSTAT) 0.4 MG SL tablet Place 1 tablet (0.4 mg total) under the tongue every 5 (five) minutes as needed for  chest pain.  50 tablet  3   No current facility-administered medications on file prior to visit.    BP 120/76  Pulse 73  Temp(Src) 97.9 F (36.6 C) (Oral)  Resp 20  Ht 5\' 10"  (1.778 m)  Wt 185 lb (83.915 kg)  BMI 26.54 kg/m2  SpO2 97%       Review of Systems  Constitutional: Negative for fever, chills, activity change, appetite change and fatigue.  HENT: Negative for congestion, dental problem, ear pain, hearing loss, mouth sores, rhinorrhea, sinus pressure, sneezing, tinnitus, trouble swallowing and voice change.   Eyes: Negative for photophobia, pain, redness and visual disturbance.  Respiratory: Negative for apnea, cough, choking, chest tightness, shortness of breath and wheezing.   Cardiovascular: Negative for chest pain, palpitations and leg swelling.  Gastrointestinal: Negative for nausea, vomiting, abdominal pain, diarrhea, constipation, blood in stool, abdominal distention, anal bleeding and rectal pain.   Genitourinary: Negative for dysuria, urgency, frequency, hematuria, flank pain, decreased urine volume, discharge, penile swelling, scrotal swelling, difficulty urinating, genital sores and testicular pain.  Musculoskeletal: Negative for arthralgias, back pain, gait problem, joint swelling, myalgias, neck pain and neck stiffness.  Skin: Negative for color change, rash and wound.  Neurological: Negative for dizziness, tremors, seizures, syncope, facial asymmetry, speech difficulty, weakness, light-headedness, numbness and headaches.  Hematological: Negative for adenopathy. Does not bruise/bleed easily.  Psychiatric/Behavioral: Negative for suicidal ideas, hallucinations, behavioral problems, confusion, sleep disturbance, self-injury, dysphoric mood, decreased concentration and agitation. The patient is not nervous/anxious.        Objective:   Physical Exam  Constitutional: He appears well-developed and well-nourished.  HENT:  Head: Normocephalic and atraumatic.  Right Ear: External ear normal.  Left Ear: External ear normal.  Nose: Nose normal.  Mouth/Throat: Oropharynx is clear and moist.  Eyes: Conjunctivae and EOM are normal. Pupils are equal, round, and reactive to light. No scleral icterus.  Neck: Normal range of motion. Neck supple. No JVD present. No thyromegaly present.  Cardiovascular: Regular rhythm, normal heart sounds and intact distal pulses.  Exam reveals no gallop and no friction rub.   No murmur heard. Left femoral bruit  Pulmonary/Chest: Effort normal and breath sounds normal. He exhibits no tenderness.  Abdominal: Soft. Bowel sounds are normal. He exhibits no distension and no mass. There is no tenderness.  Ventral hernia  Genitourinary: Prostate normal and penis normal.  Musculoskeletal: Normal range of motion. He exhibits no edema and no tenderness.  Lymphadenopathy:    He has no cervical adenopathy.  Neurological: He is alert. He has normal reflexes. No cranial  nerve deficit. Coordination normal.  Skin: Skin is warm and dry. No rash noted.  Psychiatric: He has a normal mood and affect. His behavior is normal.          Assessment & Plan:   Chronic kidney disease. Stable Hypertension fair control blood pressure 120/76. Patient does monitor home blood pressure readings often in a low-normal range Polycythemia doubt this represents polycythemia vera although with elevated WBC somewhat disconcerting. Hematocrit level varies and has been high normal in the past. O2 saturation today 97%  Total cessation of tobacco products encouraged Dyslipidemia. Suboptimal control. We'll discontinue simvastatin and substitute atorvastatin 80 Elevated PSA. Will recheck in 3 months

## 2013-06-16 ENCOUNTER — Other Ambulatory Visit: Payer: Self-pay | Admitting: Internal Medicine

## 2013-06-20 ENCOUNTER — Other Ambulatory Visit: Payer: Managed Care, Other (non HMO)

## 2013-06-30 ENCOUNTER — Other Ambulatory Visit: Payer: Managed Care, Other (non HMO)

## 2013-08-16 ENCOUNTER — Other Ambulatory Visit: Payer: Self-pay | Admitting: Internal Medicine

## 2014-02-13 ENCOUNTER — Other Ambulatory Visit: Payer: Managed Care, Other (non HMO)

## 2014-02-16 ENCOUNTER — Other Ambulatory Visit: Payer: Managed Care, Other (non HMO)

## 2014-02-17 ENCOUNTER — Other Ambulatory Visit (INDEPENDENT_AMBULATORY_CARE_PROVIDER_SITE_OTHER): Payer: Managed Care, Other (non HMO)

## 2014-02-17 DIAGNOSIS — E785 Hyperlipidemia, unspecified: Secondary | ICD-10-CM

## 2014-02-17 DIAGNOSIS — Z0000 Encounter for general adult medical examination without abnormal findings: Secondary | ICD-10-CM

## 2014-02-17 LAB — HEPATIC FUNCTION PANEL
ALT: 39 U/L (ref 0–53)
AST: 31 U/L (ref 0–37)
Albumin: 3.6 g/dL (ref 3.5–5.2)
Alkaline Phosphatase: 141 U/L — ABNORMAL HIGH (ref 39–117)
Bilirubin, Direct: 0.1 mg/dL (ref 0.0–0.3)
Total Bilirubin: 0.6 mg/dL (ref 0.2–1.2)
Total Protein: 6.9 g/dL (ref 6.0–8.3)

## 2014-02-17 LAB — LDL CHOLESTEROL, DIRECT: LDL DIRECT: 89.4 mg/dL

## 2014-02-17 LAB — POCT URINALYSIS DIPSTICK
Bilirubin, UA: NEGATIVE
Blood, UA: NEGATIVE
GLUCOSE UA: NEGATIVE
Ketones, UA: NEGATIVE
LEUKOCYTES UA: NEGATIVE
NITRITE UA: NEGATIVE
Protein, UA: NEGATIVE
Spec Grav, UA: 1.015
UROBILINOGEN UA: 0.2
pH, UA: 6

## 2014-02-17 LAB — CBC WITH DIFFERENTIAL/PLATELET
BASOS ABS: 0.1 10*3/uL (ref 0.0–0.1)
Basophils Relative: 0.6 % (ref 0.0–3.0)
EOS ABS: 0.7 10*3/uL (ref 0.0–0.7)
Eosinophils Relative: 6 % — ABNORMAL HIGH (ref 0.0–5.0)
HEMATOCRIT: 52.5 % — AB (ref 39.0–52.0)
HEMOGLOBIN: 17.6 g/dL — AB (ref 13.0–17.0)
LYMPHS ABS: 3.9 10*3/uL (ref 0.7–4.0)
Lymphocytes Relative: 34 % (ref 12.0–46.0)
MCHC: 33.5 g/dL (ref 30.0–36.0)
MCV: 95.9 fl (ref 78.0–100.0)
Monocytes Absolute: 0.9 10*3/uL (ref 0.1–1.0)
Monocytes Relative: 8.1 % (ref 3.0–12.0)
NEUTROS ABS: 5.9 10*3/uL (ref 1.4–7.7)
Neutrophils Relative %: 51.3 % (ref 43.0–77.0)
Platelets: 223 10*3/uL (ref 150.0–400.0)
RBC: 5.48 Mil/uL (ref 4.22–5.81)
RDW: 14.2 % (ref 11.5–15.5)
WBC: 11.5 10*3/uL — ABNORMAL HIGH (ref 4.0–10.5)

## 2014-02-17 LAB — BASIC METABOLIC PANEL
BUN: 30 mg/dL — ABNORMAL HIGH (ref 6–23)
CO2: 23 mEq/L (ref 19–32)
Calcium: 9.8 mg/dL (ref 8.4–10.5)
Chloride: 104 mEq/L (ref 96–112)
Creatinine, Ser: 1.8 mg/dL — ABNORMAL HIGH (ref 0.4–1.5)
GFR: 42.02 mL/min — ABNORMAL LOW (ref >60.00)
GLUCOSE: 110 mg/dL — AB (ref 70–99)
POTASSIUM: 4.7 meq/L (ref 3.5–5.1)
SODIUM: 138 meq/L (ref 135–145)

## 2014-02-17 LAB — LIPID PANEL
CHOL/HDL RATIO: 8
CHOLESTEROL: 195 mg/dL (ref 0–200)
HDL: 25.8 mg/dL — ABNORMAL LOW (ref >39.00)
NonHDL: 169.2
Triglycerides: 581 mg/dL — ABNORMAL HIGH (ref 0.0–149.0)
VLDL: 116.2 mg/dL — ABNORMAL HIGH (ref 0.0–40.0)

## 2014-02-17 LAB — PSA: PSA: 0.64 ng/mL (ref 0.10–4.00)

## 2014-02-17 LAB — TSH: TSH: 2.79 u[IU]/mL (ref 0.35–4.50)

## 2014-02-20 ENCOUNTER — Encounter: Payer: Self-pay | Admitting: Internal Medicine

## 2014-02-20 ENCOUNTER — Ambulatory Visit (INDEPENDENT_AMBULATORY_CARE_PROVIDER_SITE_OTHER): Payer: Managed Care, Other (non HMO) | Admitting: Internal Medicine

## 2014-02-20 VITALS — BP 110/78 | HR 73 | Temp 98.0°F | Resp 20 | Ht 70.5 in | Wt 181.0 lb

## 2014-02-20 DIAGNOSIS — N189 Chronic kidney disease, unspecified: Secondary | ICD-10-CM

## 2014-02-20 DIAGNOSIS — D751 Secondary polycythemia: Secondary | ICD-10-CM

## 2014-02-20 DIAGNOSIS — Z86010 Personal history of colonic polyps: Secondary | ICD-10-CM

## 2014-02-20 DIAGNOSIS — Z720 Tobacco use: Secondary | ICD-10-CM

## 2014-02-20 DIAGNOSIS — Z0000 Encounter for general adult medical examination without abnormal findings: Secondary | ICD-10-CM

## 2014-02-20 DIAGNOSIS — E785 Hyperlipidemia, unspecified: Secondary | ICD-10-CM

## 2014-02-20 DIAGNOSIS — F17200 Nicotine dependence, unspecified, uncomplicated: Secondary | ICD-10-CM

## 2014-02-20 NOTE — Progress Notes (Signed)
Pre visit review using our clinic review tool, if applicable. No additional management support is needed unless otherwise documented below in the visit note. 

## 2014-02-20 NOTE — Patient Instructions (Addendum)
Smoking tobacco is very bad for your health. You should stop smoking immediately.    It is important that you exercise regularly, at least 20 minutes 3 to 4 times per week.  If you develop chest pain or shortness of breath seek  medical attention.   Limit your sodium (Salt) intake  Please check your blood pressure on a regular basis.  If it is consistently greater than 150/90, please make an office appointment. Health Maintenance A healthy lifestyle and preventative care can promote health and wellness.  Maintain regular health, dental, and eye exams.  Eat a healthy diet. Foods like vegetables, fruits, whole grains, low-fat dairy products, and lean protein foods contain the nutrients you need and are low in calories. Decrease your intake of foods high in solid fats, added sugars, and salt. Get information about a proper diet from your health care provider, if necessary.  Regular physical exercise is one of the most important things you can do for your health. Most adults should get at least 150 minutes of moderate-intensity exercise (any activity that increases your heart rate and causes you to sweat) each week. In addition, most adults need muscle-strengthening exercises on 2 or more days a week.   Maintain a healthy weight. The body mass index (BMI) is a screening tool to identify possible weight problems. It provides an estimate of body fat based on height and weight. Your health care provider can find your BMI and can help you achieve or maintain a healthy weight. For males 20 years and older:  A BMI below 18.5 is considered underweight.  A BMI of 18.5 to 24.9 is normal.  A BMI of 25 to 29.9 is considered overweight.  A BMI of 30 and above is considered obese.  Maintain normal blood lipids and cholesterol by exercising and minimizing your intake of saturated fat. Eat a balanced diet with plenty of fruits and vegetables. Blood tests for lipids and cholesterol should begin at age 41 and  be repeated every 5 years. If your lipid or cholesterol levels are high, you are over age 14, or you are at high risk for heart disease, you may need your cholesterol levels checked more frequently.Ongoing high lipid and cholesterol levels should be treated with medicines if diet and exercise are not working.  If you smoke, find out from your health care provider how to quit. If you do not use tobacco, do not start.  Lung cancer screening is recommended for adults aged 62-80 years who are at high risk for developing lung cancer because of a history of smoking. A yearly low-dose CT scan of the lungs is recommended for people who have at least a 30-pack-year history of smoking and are current smokers or have quit within the past 15 years. A pack year of smoking is smoking an average of 1 pack of cigarettes a day for 1 year (for example, a 30-pack-year history of smoking could mean smoking 1 pack a day for 30 years or 2 packs a day for 15 years). Yearly screening should continue until the smoker has stopped smoking for at least 15 years. Yearly screening should be stopped for people who develop a health problem that would prevent them from having lung cancer treatment.  If you choose to drink alcohol, do not have more than 2 drinks per day. One drink is considered to be 12 oz (360 mL) of beer, 5 oz (150 mL) of wine, or 1.5 oz (45 mL) of liquor.  Avoid the use  of street drugs. Do not share needles with anyone. Ask for help if you need support or instructions about stopping the use of drugs.  High blood pressure causes heart disease and increases the risk of stroke. Blood pressure should be checked at least every 1-2 years. Ongoing high blood pressure should be treated with medicines if weight loss and exercise are not effective.  If you are 18-81 years old, ask your health care provider if you should take aspirin to prevent heart disease.  Diabetes screening involves taking a blood sample to check your  fasting blood sugar level. This should be done once every 3 years after age 105 if you are at a normal weight and without risk factors for diabetes. Testing should be considered at a younger age or be carried out more frequently if you are overweight and have at least 1 risk factor for diabetes.  Colorectal cancer can be detected and often prevented. Most routine colorectal cancer screening begins at the age of 30 and continues through age 7. However, your health care provider may recommend screening at an earlier age if you have risk factors for colon cancer. On a yearly basis, your health care provider may provide home test kits to check for hidden blood in the stool. A small camera at the end of a tube may be used to directly examine the colon (sigmoidoscopy or colonoscopy) to detect the earliest forms of colorectal cancer. Talk to your health care provider about this at age 62 when routine screening begins. A direct exam of the colon should be repeated every 5-10 years through age 18, unless early forms of precancerous polyps or small growths are found.  People who are at an increased risk for hepatitis B should be screened for this virus. You are considered at high risk for hepatitis B if:  You were born in a country where hepatitis B occurs often. Talk with your health care provider about which countries are considered high risk.  Your parents were born in a high-risk country and you have not received a shot to protect against hepatitis B (hepatitis B vaccine).  You have HIV or AIDS.  You use needles to inject street drugs.  You live with, or have sex with, someone who has hepatitis B.  You are a man who has sex with other men (MSM).  You get hemodialysis treatment.  You take certain medicines for conditions like cancer, organ transplantation, and autoimmune conditions.  Hepatitis C blood testing is recommended for all people born from [birth year] through 1964 and any individual with known risk  factors for hepatitis C.  Healthy men should no longer receive prostate-specific antigen (PSA) blood tests as part of routine cancer screening. Talk to your health care provider about prostate cancer screening.  Testicular cancer screening is not recommended for adolescents or adult males who have no symptoms. Screening includes self-exam, a health care provider exam, and other screening tests. Consult with your health care provider about any symptoms you have or any concerns you have about testicular cancer.  Practice safe sex. Use condoms and avoid high-risk sexual practices to reduce the spread of sexually transmitted infections (STIs).  You should be screened for STIs, including gonorrhea and chlamydia if:  You are sexually active and are younger than 24 years.  You are older than 24 years, and your health care provider tells you that you are at risk for this type of infection.  Your sexual activity has changed since you were last screened,  and you are at an increased risk for chlamydia or gonorrhea. Ask your health care provider if you are at risk.  If you are at risk of being infected with HIV, it is recommended that you take a prescription medicine daily to prevent HIV infection. This is called pre-exposure prophylaxis (PrEP). You are considered at risk if:  You are a man who has sex with other men (MSM).  You are a heterosexual man who is sexually active with multiple partners.  You take drugs by injection.  You are sexually active with a partner who has HIV.  Talk with your health care provider about whether you are at high risk of being infected with HIV. If you choose to begin PrEP, you should first be tested for HIV. You should then be tested every 3 months for as long as you are taking PrEP.  Use sunscreen. Apply sunscreen liberally and repeatedly throughout the day. You should seek shade when your shadow is shorter than you. Protect yourself by wearing long sleeves, pants, a  wide-brimmed hat, and sunglasses year round whenever you are outdoors.  Tell your health care provider of new moles or changes in moles, especially if there is a change in shape or color. Also, tell your health care provider if a mole is larger than the size of a pencil eraser.  A one-time screening for abdominal aortic aneurysm (AAA) and surgical repair of large AAAs by ultrasound is recommended for men aged 41-75 years who are current or former smokers.  Stay current with your vaccines (immunizations). Document Released: 10/07/2007 Document Revised: 04/15/2013 Document Reviewed: 09/05/2010 [facility] Patient Information 2015 [place], [place]. This information is not intended to replace advice given to you by your health care provider. Make sure you discuss any questions you have with your health care provider.

## 2014-02-20 NOTE — Progress Notes (Signed)
Patient ID: Jacob Arnold, male   DOB: 31-Jan-1961, 53 y.o.   MRN: [id number]  Subjective:    Patient ID: Jacob Arnold, male    DOB: September 28, 1960, 53 y.o.   MRN: [id number]  HPI 53 year-old patient who is seen today for a health maintenance examination.  He has had a  history of chest pain. He has been seen by cardiology and has had a negative stress echocardiogram 2013. He has had no recurrent chest pain since being placed on Nexium. He has treated hypertension and also a history of dyslipidemia. He has remote history of pericarditis. He states that this was complicated by acute renal failure at that time. Creatinine has fluctuated over the years and he states that he has been evaluated by nephrology locally [place]. Recent creatinine down to 1.5 He has a long history of intermittent polycythemia. He is a tobacco user but no history of hypoxemia He did have a screening colonoscopy 2013.  10 year interval recommended  Past Medical History  Diagnosis Date  . HYPERLIPIDEMIA   . HYPERTENSION   . Tobacco abuse   . ED (erectile dysfunction)   . Panic attacks     History   Social History  . Marital Status: Married    Spouse Name: N/A    Number of Children: N/A  . Years of Education: N/A   Occupational History  . Not on file.   Social History Main Topics  . Smoking status: Current Every Day Smoker -- 1.00 packs/day for 29 years    Types: Cigarettes  . Smokeless tobacco: Never Used  . Alcohol Use: 3.6 oz/week    6 Cans of beer per week  . Drug Use: No  . Sexual Activity: Not on file   Other Topics Concern  . Not on file   Social History Narrative  . No narrative on file    Past Surgical History  Procedure Laterality Date  . Tonsillectomy    . Vasectomy      Family History  Problem Relation Age of Onset  . Hyperlipidemia Neg Hx     family hx  . Heart disease Neg Hx     family hx  . Hypertension Neg Hx     family hx  . Cancer  Neg Hx     family hx  . Breast cancer Mother   . Liver cancer Mother   . Diabetes Father     Allergies  Allergen Reactions  . Diphenhydramine Hcl Itching and Swelling    Current Outpatient Prescriptions on File Prior to Visit  Medication Sig Dispense Refill  . aspirin 81 MG tablet Take 1 tablet (81 mg total) by mouth daily.  30 tablet    . atorvastatin (LIPITOR) 80 MG tablet Take 1 tablet (80 mg total) by mouth daily.  90 tablet  3  . chlorthalidone (HYGROTON) 25 MG tablet TAKE 1 TABLET BY MOUTH EVERY DAY  30 tablet  5  . labetalol (NORMODYNE) 200 MG tablet TAKE 2 TABLETS BY MOUTH TWICE A DAY  120 tablet  2  . lisinopril (PRINIVIL,ZESTRIL) 40 MG tablet TAKE 1 TABLET BY MOUTH ONCE DAILY  30 tablet  5  . esomeprazole (NEXIUM) 40 MG capsule Take 1 capsule (40 mg total) by mouth daily.  30 capsule  5  . nitroGLYCERIN (NITROSTAT) 0.4 MG SL tablet Place 1 tablet (0.4 mg total) under the tongue every 5 (five) minutes as needed for chest pain.  50 tablet  3  . Omega-3 Fatty Acids (FISH OIL) 1200 MG CAPS Take by mouth daily.         No current facility-administered medications on file prior to visit.    BP 110/78  Pulse 73  Temp(Src) 98 F (36.7 C) (Oral)  Resp 20  Ht 5' 10.5" (1.791 m)  Wt 181 lb (82.101 kg)  BMI 25.60 kg/m2  SpO2 98%       Review of Systems  Constitutional: Negative for fever, chills, activity change, appetite change and fatigue.  HENT: Negative for congestion, dental problem, ear pain, hearing loss, mouth sores, rhinorrhea, sinus pressure, sneezing, tinnitus, trouble swallowing and voice change.   Eyes: Negative for photophobia, pain, redness and visual disturbance.  Respiratory: Negative for apnea, cough, choking, chest tightness, shortness of breath and wheezing.   Cardiovascular: Negative for chest pain, palpitations and leg swelling.  Gastrointestinal: Negative for nausea, vomiting, abdominal pain, diarrhea, constipation, blood in stool, abdominal  distention, anal bleeding and rectal pain.  Genitourinary: Negative for dysuria, urgency, frequency, hematuria, flank pain, decreased urine volume, discharge, penile swelling, scrotal swelling, difficulty urinating, genital sores and testicular pain.  Musculoskeletal: Negative for arthralgias, back pain, gait problem, joint swelling, myalgias, neck pain and neck stiffness.  Skin: Negative for color change, rash and wound.  Neurological: Negative for dizziness, tremors, seizures, syncope, facial asymmetry, speech difficulty, weakness, light-headedness, numbness and headaches.  Hematological: Negative for adenopathy. Does not bruise/bleed easily.  Psychiatric/Behavioral: Negative for suicidal ideas, hallucinations, behavioral problems, confusion, sleep disturbance, self-injury, dysphoric mood, decreased concentration and agitation. The patient is not nervous/anxious.        Objective:   Physical Exam  Constitutional: He appears well-developed and well-nourished.  HENT:  Head: Normocephalic and atraumatic.  Right Ear: External ear normal.  Left Ear: External ear normal.  Nose: Nose normal.  Mouth/Throat: Oropharynx is clear and moist.  Eyes: Conjunctivae and EOM are normal. Pupils are equal, round, and reactive to light. No scleral icterus.  Neck: Normal range of motion. Neck supple. No JVD present. No thyromegaly present.  Cardiovascular: Regular rhythm, normal heart sounds and intact distal pulses.  Exam reveals no gallop and no friction rub.   No murmur heard. H/o Left femoral bruit (not noted today)  Pulmonary/Chest: Effort normal and breath sounds normal. He exhibits no tenderness.  Abdominal: Soft. Bowel sounds are normal. He exhibits no distension and no mass. There is no tenderness.  Ventral hernia  Genitourinary: Prostate normal and penis normal.  Musculoskeletal: Normal range of motion. He exhibits no edema and no tenderness.  Lymphadenopathy:    He has no cervical adenopathy.   Neurological: He is alert. He has normal reflexes. No cranial nerve deficit. Coordination normal.  Skin: Skin is warm and dry. No rash noted.  Psychiatric: He has a normal mood and affect. His behavior is normal.          Assessment & Plan:   Chronic kidney disease. Stable Hypertension  Patient does monitor home blood pressure readings often in a low-normal range Polycythemia doubt this represents polycythemia vera although with elevated WBC somewhat disconcerting. Hematocrit level varies and has been high normal in the past. O2 saturation today 97%  Total cessation of tobacco products encouraged Dyslipidemia.  Continue atorvastatin 80 H/O Elevated PSA.  Now normal.  Normal prostate exam

## 2014-02-24 ENCOUNTER — Telehealth: Payer: Self-pay | Admitting: Internal Medicine

## 2014-02-24 NOTE — Telephone Encounter (Signed)
emmi mailed  °

## 2014-03-12 ENCOUNTER — Other Ambulatory Visit: Payer: Self-pay | Admitting: Internal Medicine

## 2014-04-05 ENCOUNTER — Other Ambulatory Visit: Payer: Self-pay | Admitting: Internal Medicine

## 2014-05-26 ENCOUNTER — Other Ambulatory Visit: Payer: Self-pay | Admitting: Internal Medicine

## 2014-10-30 ENCOUNTER — Other Ambulatory Visit: Payer: Self-pay | Admitting: Internal Medicine

## 2014-12-25 ENCOUNTER — Other Ambulatory Visit: Payer: Self-pay | Admitting: Internal Medicine

## 2015-01-18 ENCOUNTER — Other Ambulatory Visit: Payer: Self-pay | Admitting: Internal Medicine

## 2015-02-16 ENCOUNTER — Other Ambulatory Visit (INDEPENDENT_AMBULATORY_CARE_PROVIDER_SITE_OTHER): Payer: BLUE CROSS/BLUE SHIELD

## 2015-02-16 DIAGNOSIS — Z0000 Encounter for general adult medical examination without abnormal findings: Secondary | ICD-10-CM | POA: Diagnosis not present

## 2015-02-16 DIAGNOSIS — R7989 Other specified abnormal findings of blood chemistry: Secondary | ICD-10-CM | POA: Diagnosis not present

## 2015-02-16 LAB — CBC WITH DIFFERENTIAL/PLATELET
BASOS ABS: 0.1 10*3/uL (ref 0.0–0.1)
BASOS PCT: 0.5 % (ref 0.0–3.0)
EOS ABS: 0.6 10*3/uL (ref 0.0–0.7)
Eosinophils Relative: 5.7 % — ABNORMAL HIGH (ref 0.0–5.0)
HCT: 52.2 % — ABNORMAL HIGH (ref 39.0–52.0)
Hemoglobin: 17.7 g/dL — ABNORMAL HIGH (ref 13.0–17.0)
Lymphocytes Relative: 41.1 % (ref 12.0–46.0)
Lymphs Abs: 4.3 10*3/uL — ABNORMAL HIGH (ref 0.7–4.0)
MCHC: 33.9 g/dL (ref 30.0–36.0)
MCV: 95.2 fl (ref 78.0–100.0)
MONO ABS: 0.9 10*3/uL (ref 0.1–1.0)
Monocytes Relative: 8.3 % (ref 3.0–12.0)
NEUTROS ABS: 4.7 10*3/uL (ref 1.4–7.7)
NEUTROS PCT: 44.4 % (ref 43.0–77.0)
PLATELETS: 204 10*3/uL (ref 150.0–400.0)
RBC: 5.48 Mil/uL (ref 4.22–5.81)
RDW: 14.2 % (ref 11.5–15.5)
WBC: 10.5 10*3/uL (ref 4.0–10.5)

## 2015-02-16 LAB — LIPID PANEL
CHOL/HDL RATIO: 5
Cholesterol: 191 mg/dL (ref 0–200)
HDL: 36.4 mg/dL — AB (ref >39.00)
NONHDL: 154.99
Triglycerides: 369 mg/dL — ABNORMAL HIGH (ref 0.0–149.0)
VLDL: 73.8 mg/dL — AB (ref 0.0–40.0)

## 2015-02-16 LAB — POCT URINALYSIS DIPSTICK
Glucose, UA: NEGATIVE
Ketones, UA: NEGATIVE
LEUKOCYTES UA: NEGATIVE
Nitrite, UA: NEGATIVE
PH UA: 5.5
Urobilinogen, UA: 0.2

## 2015-02-16 LAB — TSH: TSH: 3.19 u[IU]/mL (ref 0.35–4.50)

## 2015-02-16 LAB — BASIC METABOLIC PANEL
BUN: 15 mg/dL (ref 6–23)
CALCIUM: 10.4 mg/dL (ref 8.4–10.5)
CO2: 29 meq/L (ref 19–32)
Chloride: 103 mEq/L (ref 96–112)
Creatinine, Ser: 1.46 mg/dL (ref 0.40–1.50)
GFR: 53.64 mL/min — AB (ref >60.00)
GLUCOSE: 109 mg/dL — AB (ref 70–99)
POTASSIUM: 4.2 meq/L (ref 3.5–5.1)
SODIUM: 141 meq/L (ref 135–145)

## 2015-02-16 LAB — HEPATIC FUNCTION PANEL
ALT: 101 U/L — ABNORMAL HIGH (ref 0–53)
AST: 55 U/L — ABNORMAL HIGH (ref 0–37)
Albumin: 4.3 g/dL (ref 3.5–5.2)
Alkaline Phosphatase: 103 U/L (ref 39–117)
BILIRUBIN DIRECT: 0.1 mg/dL (ref 0.0–0.3)
Total Bilirubin: 0.6 mg/dL (ref 0.2–1.2)
Total Protein: 7 g/dL (ref 6.0–8.3)

## 2015-02-16 LAB — LDL CHOLESTEROL, DIRECT: Direct LDL: 98 mg/dL

## 2015-02-16 LAB — PSA: PSA: 0.54 ng/mL (ref 0.10–4.00)

## 2015-02-23 ENCOUNTER — Encounter: Payer: Managed Care, Other (non HMO) | Admitting: Internal Medicine

## 2015-05-04 ENCOUNTER — Encounter: Payer: Self-pay | Admitting: Internal Medicine

## 2015-05-04 ENCOUNTER — Ambulatory Visit (INDEPENDENT_AMBULATORY_CARE_PROVIDER_SITE_OTHER): Payer: BLUE CROSS/BLUE SHIELD | Admitting: Internal Medicine

## 2015-05-04 VITALS — BP 138/90 | HR 68 | Temp 98.0°F | Resp 20 | Ht 69.5 in | Wt 191.0 lb

## 2015-05-04 DIAGNOSIS — E119 Type 2 diabetes mellitus without complications: Secondary | ICD-10-CM | POA: Insufficient documentation

## 2015-05-04 DIAGNOSIS — F17200 Nicotine dependence, unspecified, uncomplicated: Secondary | ICD-10-CM

## 2015-05-04 DIAGNOSIS — E785 Hyperlipidemia, unspecified: Secondary | ICD-10-CM

## 2015-05-04 DIAGNOSIS — Z0000 Encounter for general adult medical examination without abnormal findings: Secondary | ICD-10-CM

## 2015-05-04 DIAGNOSIS — I10 Essential (primary) hypertension: Secondary | ICD-10-CM

## 2015-05-04 DIAGNOSIS — R7302 Impaired glucose tolerance (oral): Secondary | ICD-10-CM

## 2015-05-04 MED ORDER — CHLORTHALIDONE 25 MG PO TABS: 25.0000 mg | ORAL_TABLET | Freq: Every day | ORAL | Status: DC

## 2015-05-04 MED ORDER — ATORVASTATIN CALCIUM 80 MG PO TABS: 80.0000 mg | ORAL_TABLET | Freq: Every day | ORAL | Status: DC

## 2015-05-04 MED ORDER — ATORVASTATIN CALCIUM 40 MG PO TABS: 40.0000 mg | ORAL_TABLET | Freq: Every day | ORAL | Status: DC

## 2015-05-04 MED ORDER — LABETALOL HCL 200 MG PO TABS: 400.0000 mg | ORAL_TABLET | Freq: Two times a day (BID) | ORAL | Status: DC

## 2015-05-04 MED ORDER — LISINOPRIL 40 MG PO TABS: 40.0000 mg | ORAL_TABLET | Freq: Every day | ORAL | Status: DC

## 2015-05-04 NOTE — Patient Instructions (Signed)
Limit your sodium (Salt) intake  Please check your blood pressure on a regular basis.  If it is consistently greater than 150/90, please make an office appointment.    It is important that you exercise regularly, at least 20 minutes 3 to 4 times per week.  If you develop chest pain or shortness of breath seek  medical attention.  Avoids foods high in acid such as tomatoes citrus juices, and spicy foods.  Avoid eating within two hours of lying down or before exercising.  Do not overheat.  Try smaller more frequent meals.  Consider a trial off Nexium therapy  Smoking tobacco is very bad for your health. You should stop smoking immediately.  Health Maintenance, Male A healthy lifestyle and preventative care can promote health and wellness.  Maintain regular health, dental, and eye exams.  Eat a healthy diet. Foods like vegetables, fruits, whole grains, low-fat dairy products, and lean protein foods contain the nutrients you need and are low in calories. Decrease your intake of foods high in solid fats, added sugars, and salt. Get information about a proper diet from your health care provider, if necessary.  Regular physical exercise is one of the most important things you can do for your health. Most adults should get at least 150 minutes of moderate-intensity exercise (any activity that increases your heart rate and causes you to sweat) each week. In addition, most adults need muscle-strengthening exercises on 2 or more days a week.   Maintain a healthy weight. The body mass index (BMI) is a screening tool to identify possible weight problems. It provides an estimate of body fat based on height and weight. Your health care provider can find your BMI and can help you achieve or maintain a healthy weight. For males 20 years and older:  A BMI below 18.5 is considered underweight.  A BMI of 18.5 to 24.9 is normal.  A BMI of 25 to 29.9 is considered overweight.  A BMI of 30 and above is  considered obese.  Maintain normal blood lipids and cholesterol by exercising and minimizing your intake of saturated fat. Eat a balanced diet with plenty of fruits and vegetables. Blood tests for lipids and cholesterol should begin at age 8 and be repeated every 5 years. If your lipid or cholesterol levels are high, you are over age 104, or you are at high risk for heart disease, you may need your cholesterol levels checked more frequently.Ongoing high lipid and cholesterol levels should be treated with medicines if diet and exercise are not working.  If you smoke, find out from your health care provider how to quit. If you do not use tobacco, do not start.  Lung cancer screening is recommended for adults aged 28-80 years who are at high risk for developing lung cancer because of a history of smoking. A yearly low-dose CT scan of the lungs is recommended for people who have at least a 30-pack-year history of smoking and are current smokers or have quit within the past 15 years. A pack year of smoking is smoking an average of 1 pack of cigarettes a day for 1 year (for example, a 30-pack-year history of smoking could mean smoking 1 pack a day for 30 years or 2 packs a day for 15 years). Yearly screening should continue until the smoker has stopped smoking for at least 15 years. Yearly screening should be stopped for people who develop a health problem that would prevent them from having lung cancer treatment.  If you choose to drink alcohol, do not have more than 2 drinks per day. One drink is considered to be 12 oz (360 mL) of beer, 5 oz (150 mL) of wine, or 1.5 oz (45 mL) of liquor.  Avoid the use of street drugs. Do not share needles with anyone. Ask for help if you need support or instructions about stopping the use of drugs.  High blood pressure causes heart disease and increases the risk of stroke. High blood pressure is more likely to develop in:  People who have blood pressure in the end of the  normal range (100-139/85-89 mm Hg).  People who are overweight or obese.  People who are African American.  If you are 53-77 years of age, have your blood pressure checked every 3-5 years. If you are 6 years of age or older, have your blood pressure checked every year. You should have your blood pressure measured twice--once when you are at a hospital or clinic, and once when you are not at a hospital or clinic. Record the average of the two measurements. To check your blood pressure when you are not at a hospital or clinic, you can use:  An automated blood pressure machine at a pharmacy.  A home blood pressure monitor.  If you are 66-43 years old, ask your health care provider if you should take aspirin to prevent heart disease.  Diabetes screening involves taking a blood sample to check your fasting blood sugar level. This should be done once every 3 years after age 66 if you are at a normal weight and without risk factors for diabetes. Testing should be considered at a younger age or be carried out more frequently if you are overweight and have at least 1 risk factor for diabetes.  Colorectal cancer can be detected and often prevented. Most routine colorectal cancer screening begins at the age of 30 and continues through age 34. However, your health care provider may recommend screening at an earlier age if you have risk factors for colon cancer. On a yearly basis, your health care provider may provide home test kits to check for hidden blood in the stool. A small camera at the end of a tube may be used to directly examine the colon (sigmoidoscopy or colonoscopy) to detect the earliest forms of colorectal cancer. Talk to your health care provider about this at age 47 when routine screening begins. A direct exam of the colon should be repeated every 5-10 years through age 74, unless early forms of precancerous polyps or small growths are found.  People who are at an increased risk for hepatitis  B should be screened for this virus. You are considered at high risk for hepatitis B if:  You were born in a country where hepatitis B occurs often. Talk with your health care provider about which countries are considered high risk.  Your parents were born in a high-risk country and you have not received a shot to protect against hepatitis B (hepatitis B vaccine).  You have HIV or AIDS.  You use needles to inject street drugs.  You live with, or have sex with, someone who has hepatitis B.  You are a man who has sex with other men (MSM).  You get hemodialysis treatment.  You take certain medicines for conditions like cancer, organ transplantation, and autoimmune conditions.  Hepatitis C blood testing is recommended for all people born from [birth year] through 1964 and any individual with known risk factors for hepatitis C.  Healthy men should no longer receive prostate-specific antigen (PSA) blood tests as part of routine cancer screening. Talk to your health care provider about prostate cancer screening.  Testicular cancer screening is not recommended for adolescents or adult males who have no symptoms. Screening includes self-exam, a health care provider exam, and other screening tests. Consult with your health care provider about any symptoms you have or any concerns you have about testicular cancer.  Practice safe sex. Use condoms and avoid high-risk sexual practices to reduce the spread of sexually transmitted infections (STIs).  You should be screened for STIs, including gonorrhea and chlamydia if:  You are sexually active and are younger than 24 years.  You are older than 24 years, and your health care provider tells you that you are at risk for this type of infection.  Your sexual activity has changed since you were last screened, and you are at an increased risk for chlamydia or gonorrhea. Ask your health care provider if you are at risk.  If you are at risk of being infected with  HIV, it is recommended that you take a prescription medicine daily to prevent HIV infection. This is called pre-exposure prophylaxis (PrEP). You are considered at risk if:  You are a man who has sex with other men (MSM).  You are a heterosexual man who is sexually active with multiple partners.  You take drugs by injection.  You are sexually active with a partner who has HIV.  Talk with your health care provider about whether you are at high risk of being infected with HIV. If you choose to begin PrEP, you should first be tested for HIV. You should then be tested every 3 months for as long as you are taking PrEP.  Use sunscreen. Apply sunscreen liberally and repeatedly throughout the day. You should seek shade when your shadow is shorter than you. Protect yourself by wearing long sleeves, pants, a wide-brimmed hat, and sunglasses year round whenever you are outdoors.  Tell your health care provider of new moles or changes in moles, especially if there is a change in shape or color. Also, tell your health care provider if a mole is larger than the size of a pencil eraser.  A one-time screening for abdominal aortic aneurysm (AAA) and surgical repair of large AAAs by ultrasound is recommended for men aged 37-75 years who are current or former smokers.  Stay current with your vaccines (immunizations).   This information is not intended to replace advice given to you by your health care provider. Make sure you discuss any questions you have with your health care provider.   Document Released: 10/07/2007 Document Revised: 05/01/2014 Document Reviewed: 09/05/2010 Elsevier Interactive Patient Education [employer].

## 2015-05-04 NOTE — Progress Notes (Signed)
Pre visit review using our clinic review tool, if applicable. No additional management support is needed unless otherwise documented below in the visit note. 

## 2015-05-04 NOTE — Progress Notes (Signed)
Subjective:    Patient ID: Jacob Arnold, male    DOB: [birth year], 55 y.o.   MRN: [id number]  HPI  Patient ID: Jacob Arnold, male   DOB: 1960-12-24, 55 y.o.   MRN: [id number]  Subjective:    Patient ID: Jacob Arnold, male    DOB: April 25, 1960, 55 y.o.   MRN: [id number]  HPI 55  year-old patient who is seen today for a health maintenance examination.  He has had a  history of chest pain. He has been seen by cardiology and has had a negative stress echocardiogram 2013. He has had no recurrent chest pain since being placed on Nexium. He has treated hypertension and also a history of dyslipidemia. He has remote history of pericarditis. He states that this was complicated by acute renal failure at that time. Creatinine has fluctuated over the years and he states that he has been evaluated by nephrology [place]. Recent creatinine down to 1.5 He has a long history of intermittent polycythemia. He is a tobacco user but no history of hypoxemia He did have a screening colonoscopy 2013.  10 year interval recommended  Family history.  Father age 36 fourth history of diabetes, hypercholesterolemia Mother died a 69 complications of breast cancer  3 brothers, positive for dyslipidemia  Social history 3 sons  Past Medical History  Diagnosis Date  . HYPERLIPIDEMIA   . HYPERTENSION   . Tobacco abuse   . ED (erectile dysfunction)   . Panic attacks     Social History   Social History  . Marital Status: Married    Spouse Name: N/A  . Number of Children: N/A  . Years of Education: N/A   Occupational History  . Not on file.   Social History Main Topics  . Smoking status: Current Every Day Smoker -- 1.00 packs/day for 29 years    Types: Cigarettes  . Smokeless tobacco: Never Used     Comment: pt down to 15 cigarettes a day  . Alcohol Use: 3.6 oz/week    6 Cans of beer per week  . Drug Use: No  . Sexual Activity: Not on file   Other  Topics Concern  . Not on file   Social History Narrative    Past Surgical History  Procedure Laterality Date  . Tonsillectomy    . Vasectomy      Family History  Problem Relation Age of Onset  . Hyperlipidemia Neg Hx     family hx  . Heart disease Neg Hx     family hx  . Hypertension Neg Hx     family hx  . Cancer Neg Hx     family hx  . Breast cancer Mother   . Liver cancer Mother   . Diabetes Father     Allergies  Allergen Reactions  . Diphenhydramine Hcl Itching and Swelling    Current Outpatient Prescriptions on File Prior to Visit  Medication Sig Dispense Refill  . aspirin 81 MG tablet Take 1 tablet (81 mg total) by mouth daily. 30 tablet   . esomeprazole (NEXIUM) 40 MG capsule Take 1 capsule (40 mg total) by mouth daily. (Patient taking differently: Take 40 mg by mouth daily. OTC) 30 capsule 5  . nitroGLYCERIN (NITROSTAT) 0.4 MG SL tablet Place 1 tablet (0.4 mg total) under the tongue every 5 (five) minutes as needed for chest pain. 50 tablet 3   No current facility-administered medications on file prior to  visit.    BP 138/90 mmHg  Pulse 68  Temp(Src) 98 F (36.7 C) (Oral)  Resp 20  Ht 5' 9.5" (1.765 m)  Wt 191 lb (86.637 kg)  BMI 27.81 kg/m2  SpO2 98%       Review of Systems  Constitutional: Negative for fever, chills, activity change, appetite change and fatigue.  HENT: Negative for congestion, dental problem, ear pain, hearing loss, mouth sores, rhinorrhea, sinus pressure, sneezing, tinnitus, trouble swallowing and voice change.   Eyes: Negative for photophobia, pain, redness and visual disturbance.  Respiratory: Negative for apnea, cough, choking, chest tightness, shortness of breath and wheezing.   Cardiovascular: Negative for chest pain, palpitations and leg swelling.  Gastrointestinal: Negative for nausea, vomiting, abdominal pain, diarrhea, constipation, blood in stool, abdominal distention, anal bleeding and rectal pain.  Genitourinary:  Negative for dysuria, urgency, frequency, hematuria, flank pain, decreased urine volume, discharge, penile swelling, scrotal swelling, difficulty urinating, genital sores and testicular pain.  Musculoskeletal: Negative for arthralgias, back pain, gait problem, joint swelling, myalgias, neck pain and neck stiffness.  Skin: Negative for color change, rash and wound.  Neurological: Negative for dizziness, tremors, seizures, syncope, facial asymmetry, speech difficulty, weakness, light-headedness, numbness and headaches.  Hematological: Negative for adenopathy. Does not bruise/bleed easily.  Psychiatric/Behavioral: Negative for suicidal ideas, hallucinations, behavioral problems, confusion, sleep disturbance, self-injury, dysphoric mood, decreased concentration and agitation. The patient is not nervous/anxious.        Objective:   Physical Exam  Constitutional: He appears well-developed and well-nourished.  HENT:  Head: Normocephalic and atraumatic.  Right Ear: External ear normal.  Left Ear: External ear normal.  Nose: Nose normal.  Mouth/Throat: Oropharynx is clear and moist.  Eyes: Conjunctivae and EOM are normal. Pupils are equal, round, and reactive to light. No scleral icterus.  Neck: Normal range of motion. Neck supple. No JVD present. No thyromegaly present.  Cardiovascular: Regular rhythm, normal heart sounds and intact distal pulses.  Exam reveals no gallop and no friction rub.   No murmur heard. H/o Left femoral bruit (not noted today)  Pulmonary/Chest: Effort normal and breath sounds normal. He exhibits no tenderness.  Abdominal: Soft. Bowel sounds are normal. He exhibits no distension and no mass. There is no tenderness.  Ventral hernia  Genitourinary: Prostate normal and penis normal.  Musculoskeletal: Normal range of motion. He exhibits no edema and no tenderness.  Lymphadenopathy:    He has no cervical adenopathy.  Neurological: He is alert. He has normal reflexes. No  cranial nerve deficit. Coordination normal.  Skin: Skin is warm and dry. No rash noted.  Psychiatric: He has a normal mood and affect. His behavior is normal.          Assessment & Plan:   Chronic kidney disease. Stable Hypertension  Patient does monitor home blood pressure readings often in a low-normal range Polycythemia doubt this represents polycythemia vera although with elevated WBC somewhat disconcerting. Hematocrit level varies and has been high normal in the past. O2 saturation today 97%  Total cessation of tobacco products encouraged Dyslipidemia.  Continue atorvastatin 80 H/O Elevated PSA.  Now normal.  Normal prostate exam  Review of Systems As above    Objective:   Physical Exam  As above      Assessment & Plan:   Preventive health exam Total smoking cessation encouraged Continue home blood pressure monitoring Patient was encouraged a trial off Nexium  Return in one year or as needed

## 2016-05-01 ENCOUNTER — Other Ambulatory Visit: Payer: BLUE CROSS/BLUE SHIELD

## 2016-05-02 ENCOUNTER — Other Ambulatory Visit (INDEPENDENT_AMBULATORY_CARE_PROVIDER_SITE_OTHER): Payer: BLUE CROSS/BLUE SHIELD

## 2016-05-02 DIAGNOSIS — R7989 Other specified abnormal findings of blood chemistry: Secondary | ICD-10-CM | POA: Diagnosis not present

## 2016-05-02 DIAGNOSIS — Z0000 Encounter for general adult medical examination without abnormal findings: Secondary | ICD-10-CM

## 2016-05-02 LAB — BASIC METABOLIC PANEL
BUN: 38 mg/dL — ABNORMAL HIGH (ref 6–23)
CO2: 23 mEq/L (ref 19–32)
Calcium: 9.8 mg/dL (ref 8.4–10.5)
Chloride: 103 mEq/L (ref 96–112)
Creatinine, Ser: 1.75 mg/dL — ABNORMAL HIGH (ref 0.40–1.50)
GFR: 43.32 mL/min — ABNORMAL LOW (ref >60.00)
Glucose, Bld: 127 mg/dL — ABNORMAL HIGH (ref 70–99)
Potassium: 4.5 mEq/L (ref 3.5–5.1)
Sodium: 136 mEq/L (ref 135–145)

## 2016-05-02 LAB — TSH: TSH: 4.6 u[IU]/mL — AB (ref 0.35–4.50)

## 2016-05-02 LAB — CBC WITH DIFFERENTIAL/PLATELET
BASOS ABS: 0.1 10*3/uL (ref 0.0–0.1)
Basophils Relative: 0.6 % (ref 0.0–3.0)
EOS ABS: 0.7 10*3/uL (ref 0.0–0.7)
Eosinophils Relative: 7.1 % — ABNORMAL HIGH (ref 0.0–5.0)
HEMATOCRIT: 51.3 % (ref 39.0–52.0)
HEMOGLOBIN: 17.6 g/dL — AB (ref 13.0–17.0)
LYMPHS PCT: 39.9 % (ref 12.0–46.0)
Lymphs Abs: 4.1 10*3/uL — ABNORMAL HIGH (ref 0.7–4.0)
MCHC: 34.4 g/dL (ref 30.0–36.0)
MCV: 97.2 fl (ref 78.0–100.0)
MONOS PCT: 7.7 % (ref 3.0–12.0)
Monocytes Absolute: 0.8 10*3/uL (ref 0.1–1.0)
Neutro Abs: 4.6 10*3/uL (ref 1.4–7.7)
Neutrophils Relative %: 44.7 % (ref 43.0–77.0)
Platelets: 235 10*3/uL (ref 150.0–400.0)
RBC: 5.28 Mil/uL (ref 4.22–5.81)
RDW: 13.7 % (ref 11.5–15.5)
WBC: 10.2 10*3/uL (ref 4.0–10.5)

## 2016-05-02 LAB — HEPATIC FUNCTION PANEL
ALT: 35 U/L (ref 0–53)
AST: 22 U/L (ref 0–37)
Albumin: 4.4 g/dL (ref 3.5–5.2)
Alkaline Phosphatase: 93 U/L (ref 39–117)
Bilirubin, Direct: 0.1 mg/dL (ref 0.0–0.3)
Total Bilirubin: 0.4 mg/dL (ref 0.2–1.2)
Total Protein: 7 g/dL (ref 6.0–8.3)

## 2016-05-02 LAB — POC URINALSYSI DIPSTICK (AUTOMATED)
BILIRUBIN UA: NEGATIVE
Glucose, UA: NEGATIVE
KETONES UA: NEGATIVE
LEUKOCYTES UA: NEGATIVE
Nitrite, UA: NEGATIVE
PH UA: 5.5
RBC UA: NEGATIVE
SPEC GRAV UA: 1.025
Urobilinogen, UA: 0.2

## 2016-05-02 LAB — LIPID PANEL
Cholesterol: 226 mg/dL — ABNORMAL HIGH (ref 0–200)
HDL: 27.4 mg/dL — AB (ref >39.00)
Total CHOL/HDL Ratio: 8

## 2016-05-02 LAB — LDL CHOLESTEROL, DIRECT: Direct LDL: 80 mg/dL

## 2016-05-02 LAB — PSA: PSA: 0.91 ng/mL (ref 0.10–4.00)

## 2016-05-05 ENCOUNTER — Other Ambulatory Visit: Payer: Self-pay | Admitting: Internal Medicine

## 2016-05-08 ENCOUNTER — Ambulatory Visit (INDEPENDENT_AMBULATORY_CARE_PROVIDER_SITE_OTHER): Payer: BLUE CROSS/BLUE SHIELD | Admitting: Internal Medicine

## 2016-05-08 ENCOUNTER — Encounter: Payer: Self-pay | Admitting: Internal Medicine

## 2016-05-08 VITALS — BP 126/70 | HR 66 | Temp 97.6°F | Ht 69.0 in | Wt 195.0 lb

## 2016-05-08 DIAGNOSIS — Z0000 Encounter for general adult medical examination without abnormal findings: Secondary | ICD-10-CM | POA: Diagnosis not present

## 2016-05-08 MED ORDER — ATORVASTATIN CALCIUM 40 MG PO TABS: 40.0000 mg | ORAL_TABLET | Freq: Every day | ORAL | 4 refills | Status: DC

## 2016-05-08 MED ORDER — LABETALOL HCL 200 MG PO TABS: 400.0000 mg | ORAL_TABLET | Freq: Two times a day (BID) | ORAL | 1 refills | Status: DC

## 2016-05-08 MED ORDER — LISINOPRIL 40 MG PO TABS: 40.0000 mg | ORAL_TABLET | Freq: Every day | ORAL | 11 refills | Status: DC

## 2016-05-08 MED ORDER — CHLORTHALIDONE 25 MG PO TABS
25.0000 mg | ORAL_TABLET | Freq: Every day | ORAL | 11 refills | Status: DC
Start: 2016-05-08 — End: 2017-02-21

## 2016-05-08 NOTE — Patient Instructions (Addendum)
Limit your sodium (Salt) intake  Smoking tobacco is very bad for your health. You should stop smoking immediately.  Please check your blood pressure on a regular basis.  If it is consistently greater than 150/90, please make an office appointment.    It is important that you exercise regularly, at least 20 minutes 3 to 4 times per week.  If you develop chest pain or shortness of breath seek  medical attention.  You need to lose weight.  Consider a lower calorie diet and regular exercise.  Return in one year for follow-up  Food Choices to Lower Your Triglycerides Triglycerides are a type of fat in your blood. High levels of triglycerides can increase the risk of heart disease and stroke. If your triglyceride levels are high, the foods you eat and your eating habits are very important. Choosing the right foods can help lower your triglycerides. What general guidelines do I need to follow?  Lose weight if you are overweight.  Limit or avoid alcohol.  Fill one half of your plate with vegetables and green salads.  Limit fruit to two servings a day. Choose fruit instead of juice.  Make one fourth of your plate whole grains. Look for the word "whole" as the first word in the ingredient list.  Fill one fourth of your plate with lean protein foods.  Enjoy fatty fish (such as salmon, mackerel, sardines, and tuna) three times a week.  Choose healthy fats.  Limit foods high in starch and sugar.  Eat more home-cooked food and less restaurant, buffet, and fast food.  Limit fried foods.  Cook foods using methods other than frying.  Limit saturated fats.  Check ingredient lists to avoid foods with partially hydrogenated oils (trans fats) in them. What foods can I eat? Grains  Whole grains, such as whole wheat or whole grain breads, crackers, cereals, and pasta. Unsweetened oatmeal, bulgur, barley, quinoa, or brown rice. Corn or whole wheat flour tortillas. Vegetables  Fresh or frozen  vegetables (raw, steamed, roasted, or grilled). Green salads. Fruits  All fresh, canned (in natural juice), or frozen fruits. Meat and Other Protein Products  Ground beef (85% or leaner), grass-fed beef, or beef trimmed of fat. Skinless chicken or [place]. Ground chicken or [place]. Pork trimmed of fat. All fish and seafood. Eggs. Dried beans, peas, or lentils. Unsalted nuts or seeds. Unsalted canned or dry beans. Dairy  Low-fat dairy products, such as skim or 1% milk, 2% or reduced-fat cheeses, low-fat ricotta or cottage cheese, or plain low-fat yogurt. Fats and Oils  Tub margarines without trans fats. Light or reduced-fat mayonnaise and salad dressings. Avocado. Safflower, olive, or canola oils. Natural peanut or almond butter. The items listed above may not be a complete list of recommended foods or beverages. Contact your dietitian for more options.  What foods are not recommended? Grains  White bread. White pasta. White rice. Cornbread. Bagels, pastries, and croissants. Crackers that contain trans fat. Vegetables  White potatoes. Corn. Creamed or fried vegetables. Vegetables in a cheese sauce. Fruits  Dried fruits. Canned fruit in light or heavy syrup. Fruit juice. Meat and Other Protein Products  Fatty cuts of meat. Ribs, chicken wings, bacon, sausage, bologna, salami, chitterlings, fatback, hot dogs, bratwurst, and packaged luncheon meats. Dairy  Whole or 2% milk, cream, half-and-half, and cream cheese. Whole-fat or sweetened yogurt. Full-fat cheeses. Nondairy creamers and whipped toppings. Processed cheese, cheese spreads, or cheese curds. Sweets and Desserts  Corn syrup, sugars, honey, and molasses. Candy. Jam  and jelly. Syrup. Sweetened cereals. Cookies, pies, cakes, donuts, muffins, and ice cream. Fats and Oils  Butter, stick margarine, lard, shortening, ghee, or bacon fat. Coconut, palm kernel, or palm oils. Beverages  Alcohol. Sweetened drinks (such as sodas, lemonade, and fruit  drinks or punches). The items listed above may not be a complete list of foods and beverages to avoid. Contact your dietitian for more information.  This information is not intended to replace advice given to you by your health care provider. Make sure you discuss any questions you have with your health care provider. Document Released: 01/27/2004 Document Revised: 09/16/2015 Document Reviewed: 02/12/2013 Elsevier Interactive Patient Education  2017 [employer].

## 2016-05-08 NOTE — Progress Notes (Signed)
Subjective:    Patient ID: Jacob Arnold, male    DOB: 10/25/1960, 56 y.o.   MRN: [id number]  HPI  56 year old patient who is seen today for a preventive health examination He has a history of essential hypertension.  He does monitor home blood pressure readings with nice results. He has remote history of pericarditis, gait, by acute renal failure.  His creatinine clearance has fluctuated over the years, but remains fairly stable He has impaired glucose tolerance and a history of ongoing tobacco use  Family history unchanged.  Jacob Arnold died at 23 of breast cancer.  Jacob Arnold age 56 with a history of coronary artery disease, diabetes, hypertension and dyslipidemia.  Jacob Arnold remain well  Social history fairly inactive.  Dance movement psychotherapist  Past Medical History:  Diagnosis Date  . ED (erectile dysfunction)   . HYPERLIPIDEMIA   . HYPERTENSION   . Panic attacks   . Tobacco abuse      Social History   Social History  . Marital status: Married    Jacob Arnold name: N/A  . Number of children: N/A  . Years of education: N/A   Occupational History  . Not on file.   Social History Main Topics  . Smoking status: Current Every Day Smoker    Packs/day: 1.00    Years: 29.00    Types: Cigarettes  . Smokeless tobacco: Never Used     Comment: pt down to 15 cigarettes a day  . Alcohol use Jacob.6 oz/week    6 Cans of beer per week  . Drug use: No  . Sexual activity: Not on file   Other Topics Concern  . Not on file   Social History Narrative  . No narrative on file    Past Surgical History:  Procedure Laterality Date  . TONSILLECTOMY    . VASECTOMY      Family History  Problem Relation Age of Onset  . Hyperlipidemia Neg Hx     family hx  . Heart disease Neg Hx     family hx  . Hypertension Neg Hx     family hx  . Cancer Neg Hx     family hx  . Breast cancer Jacob Arnold   . Liver cancer Jacob Arnold   . Diabetes Jacob Arnold     Allergies  Allergen Reactions  . Diphenhydramine Hcl  Itching and Swelling    Current Outpatient Prescriptions on File Prior to Visit  Medication Sig Dispense Refill  . aspirin 81 MG tablet Take 1 tablet (81 mg total) by mouth daily. 30 tablet   . nitroGLYCERIN (NITROSTAT) 0.4 MG SL tablet Place 1 tablet (0.4 mg total) under the tongue every 5 (five) minutes as needed for chest pain. 50 tablet Jacob   No current facility-administered medications on file prior to visit.     BP 126/70 (BP Location: Right Arm, Patient Position: Sitting, Cuff Size: Normal)   Pulse 66   Temp 97.6 F (36.4 C) (Oral)   Ht 5\' 9"  (1.753 m)   Wt 195 lb (88.5 kg)   SpO2 98%   BMI 28.80 kg/m     Review of Systems  Constitutional: Negative for activity change, appetite change, chills, fatigue and fever.  HENT: Negative for congestion, dental problem, ear pain, hearing loss, mouth sores, rhinorrhea, sinus pressure, sneezing, tinnitus, trouble swallowing and voice change.   Eyes: Negative for photophobia, pain, redness and visual disturbance.  Respiratory: Negative for apnea, cough, choking, chest tightness, shortness of breath and wheezing.  Cardiovascular: Negative for chest pain, palpitations and leg swelling.  Gastrointestinal: Negative for abdominal distention, abdominal pain, anal bleeding, blood in stool, constipation, diarrhea, nausea, rectal pain and vomiting.  Genitourinary: Negative for decreased urine volume, difficulty urinating, discharge, dysuria, flank pain, frequency, genital sores, hematuria, penile swelling, scrotal swelling, testicular pain and urgency.  Musculoskeletal: Negative for arthralgias, back pain, gait problem, joint swelling, myalgias, neck pain and neck stiffness.  Skin: Negative for color change, rash and wound.  Neurological: Negative for dizziness, tremors, seizures, syncope, facial asymmetry, speech difficulty, weakness, light-headedness, numbness and headaches.  Hematological: Negative for adenopathy. Does not bruise/bleed easily.    Psychiatric/Behavioral: Negative for agitation, behavioral problems, confusion, decreased concentration, dysphoric mood, hallucinations, self-injury, sleep disturbance and suicidal ideas. The patient is not nervous/anxious.        Objective:   Physical Exam  Constitutional: He appears well-developed and well-nourished.  Blood pressure 124/78  HENT:  Head: Normocephalic and atraumatic.  Right Ear: External ear normal.  Left Ear: External ear normal.  Nose: Nose normal.  Mouth/Throat: Oropharynx is clear and moist.  Eyes: Conjunctivae and EOM are normal. Pupils are equal, round, and reactive to light. No scleral icterus.  Neck: Normal range of motion. Neck supple. No JVD present. No thyromegaly present.  Cardiovascular: Regular rhythm, normal heart sounds and intact distal pulses.  Exam reveals no gallop and no friction rub.   No murmur heard. Pulmonary/Chest: Effort normal and breath sounds normal. He exhibits no tenderness.  Abdominal: Soft. Bowel sounds are normal. He exhibits no distension and no mass. There is no tenderness.  Small ventral hernia  Genitourinary: Prostate normal and penis normal. Rectal exam shows guaiac negative stool.  Musculoskeletal: Normal range of motion. He exhibits no edema or tenderness.  Lymphadenopathy:    He has no cervical adenopathy.  Neurological: He is alert. He has normal reflexes. No cranial nerve deficit. Coordination normal.  Skin: Skin is warm and dry. No rash noted.  Psychiatric: He has a normal mood and affect. His behavior is normal.          Assessment & Plan:   Preventive health examination Hypertension, stable Chronic kidney disease, stable Impaired glucose tolerance Dyslipidemia.  Dietary information dispensed  Continue home blood pressure monitoring Modest weight loss encouraged More vigorous exercise.  Encouraged  Nyoka Cowden

## 2016-05-08 NOTE — Progress Notes (Signed)
Pre visit review using our clinic review tool, if applicable. No additional management support is needed unless otherwise documented below in the visit note. 

## 2016-05-19 ENCOUNTER — Other Ambulatory Visit: Payer: Self-pay | Admitting: Internal Medicine

## 2016-07-18 ENCOUNTER — Other Ambulatory Visit: Payer: Self-pay | Admitting: Internal Medicine

## 2016-08-09 ENCOUNTER — Other Ambulatory Visit: Payer: Self-pay | Admitting: Internal Medicine

## 2016-10-18 ENCOUNTER — Other Ambulatory Visit: Payer: Self-pay | Admitting: Internal Medicine

## 2017-01-11 ENCOUNTER — Encounter: Payer: Self-pay | Admitting: Internal Medicine

## 2017-02-21 ENCOUNTER — Other Ambulatory Visit: Payer: Self-pay | Admitting: Internal Medicine

## 2017-02-21 MED ORDER — CHLORTHALIDONE 25 MG PO TABS: 25.0000 mg | ORAL_TABLET | Freq: Every day | ORAL | 3 refills | Status: DC

## 2017-05-03 ENCOUNTER — Other Ambulatory Visit: Payer: BLUE CROSS/BLUE SHIELD

## 2017-05-09 ENCOUNTER — Ambulatory Visit (INDEPENDENT_AMBULATORY_CARE_PROVIDER_SITE_OTHER): Payer: BLUE CROSS/BLUE SHIELD | Admitting: Internal Medicine

## 2017-05-09 ENCOUNTER — Encounter: Payer: Self-pay | Admitting: Internal Medicine

## 2017-05-09 VITALS — BP 122/60 | HR 71 | Temp 98.1°F | Ht 69.0 in | Wt 190.2 lb

## 2017-05-09 DIAGNOSIS — Z125 Encounter for screening for malignant neoplasm of prostate: Secondary | ICD-10-CM | POA: Diagnosis not present

## 2017-05-09 DIAGNOSIS — Z0000 Encounter for general adult medical examination without abnormal findings: Secondary | ICD-10-CM | POA: Diagnosis not present

## 2017-05-09 LAB — CBC WITH DIFFERENTIAL/PLATELET
BASOS PCT: 0.6 % (ref 0.0–3.0)
Basophils Absolute: 0.1 10*3/uL (ref 0.0–0.1)
EOS ABS: 0.9 10*3/uL — AB (ref 0.0–0.7)
Eosinophils Relative: 8.3 % — ABNORMAL HIGH (ref 0.0–5.0)
HCT: 55.9 % — ABNORMAL HIGH (ref 39.0–52.0)
Hemoglobin: 18.6 g/dL (ref 13.0–17.0)
LYMPHS ABS: 3.1 10*3/uL (ref 0.7–4.0)
Lymphocytes Relative: 29.8 % (ref 12.0–46.0)
MCHC: 33.2 g/dL (ref 30.0–36.0)
MCV: 100 fl (ref 78.0–100.0)
MONO ABS: 0.9 10*3/uL (ref 0.1–1.0)
Monocytes Relative: 8.6 % (ref 3.0–12.0)
NEUTROS ABS: 5.4 10*3/uL (ref 1.4–7.7)
Neutrophils Relative %: 52.7 % (ref 43.0–77.0)
PLATELETS: 218 10*3/uL (ref 150.0–400.0)
RBC: 5.59 Mil/uL (ref 4.22–5.81)
RDW: 13.6 % (ref 11.5–15.5)
WBC: 10.3 10*3/uL (ref 4.0–10.5)

## 2017-05-09 LAB — LIPID PANEL
Cholesterol: 165 mg/dL (ref 0–200)
HDL: 29.6 mg/dL — ABNORMAL LOW
NonHDL: 135.16
Total CHOL/HDL Ratio: 6
Triglycerides: 335 mg/dL — ABNORMAL HIGH (ref 0.0–149.0)
VLDL: 67 mg/dL — ABNORMAL HIGH (ref 0.0–40.0)

## 2017-05-09 LAB — COMPREHENSIVE METABOLIC PANEL
ALT: 39 U/L (ref 0–53)
AST: 24 U/L (ref 0–37)
Albumin: 4.6 g/dL (ref 3.5–5.2)
Alkaline Phosphatase: 115 U/L (ref 39–117)
BILIRUBIN TOTAL: 0.9 mg/dL (ref 0.2–1.2)
BUN: 26 mg/dL — AB (ref 6–23)
CALCIUM: 9.6 mg/dL (ref 8.4–10.5)
CO2: 26 meq/L (ref 19–32)
CREATININE: 1.54 mg/dL — AB (ref 0.40–1.50)
Chloride: 103 mEq/L (ref 96–112)
GFR: 50.02 mL/min — ABNORMAL LOW (ref >60.00)
GLUCOSE: 108 mg/dL — AB (ref 70–99)
Potassium: 4.5 mEq/L (ref 3.5–5.1)
Sodium: 136 mEq/L (ref 135–145)
Total Protein: 7.3 g/dL (ref 6.0–8.3)

## 2017-05-09 LAB — TSH: TSH: 2.43 u[IU]/mL (ref 0.35–4.50)

## 2017-05-09 LAB — PSA: PSA: 0.72 ng/mL (ref 0.10–4.00)

## 2017-05-09 LAB — LDL CHOLESTEROL, DIRECT: Direct LDL: 95 mg/dL

## 2017-05-09 LAB — HEMOGLOBIN A1C: Hgb A1c MFr Bld: 6.5 % (ref 4.6–6.5)

## 2017-05-09 NOTE — Patient Instructions (Addendum)
Limit your sodium (Salt) intake  Please check your blood pressure on a regular basis.  If it is consistently greater than 150/90, please make an office appointment.    It is important that you exercise regularly, at least 20 minutes 3 to 4 times per week.  If you develop chest pain or shortness of breath seek  medical attention.  Return in one year for follow-up  Smoking tobacco is very bad for your health. You should stop smoking immediately.  DASH Eating Plan DASH stands for "Dietary Approaches to Stop Hypertension." The DASH eating plan is a healthy eating plan that has been shown to reduce high blood pressure (hypertension). It may also reduce your risk for type 2 diabetes, heart disease, and stroke. The DASH eating plan may also help with weight loss. What are tips for following this plan? General guidelines  Avoid eating more than 2,300 mg (milligrams) of salt (sodium) a day. If you have hypertension, you may need to reduce your sodium intake to 1,500 mg a day.  Limit alcohol intake to no more than 1 drink a day for nonpregnant women and 2 drinks a day for men. One drink equals 12 oz of beer, 5 oz of wine, or 1 oz of hard liquor.  Work with your health care provider to maintain a healthy body weight or to lose weight. Ask what an ideal weight is for you.  Get at least 30 minutes of exercise that causes your heart to beat faster (aerobic exercise) most days of the week. Activities may include walking, swimming, or biking.  Work with your health care provider or diet and nutrition specialist (dietitian) to adjust your eating plan to your individual calorie needs. Reading food labels  Check food labels for the amount of sodium per serving. Choose foods with less than 5 percent of the Daily Value of sodium. Generally, foods with less than 300 mg of sodium per serving fit into this eating plan.  To find whole grains, look for the word "whole" as the first word in the ingredient  list. Shopping  Buy products labeled as "low-sodium" or "no salt added."  Buy fresh foods. Avoid canned foods and premade or frozen meals. Cooking  Avoid adding salt when cooking. Use salt-free seasonings or herbs instead of table salt or sea salt. Check with your health care provider or pharmacist before using salt substitutes.  Do not fry foods. Cook foods using healthy methods such as baking, boiling, grilling, and broiling instead.  Cook with heart-healthy oils, such as olive, canola, soybean, or sunflower oil. Meal planning   Eat a balanced diet that includes: ? 5 or more servings of fruits and vegetables each day. At each meal, try to fill half of your plate with fruits and vegetables. ? Up to 6-8 servings of whole grains each day. ? Less than 6 oz of lean meat, poultry, or fish each day. A 3-oz serving of meat is about the same size as a deck of cards. One egg equals 1 oz. ? 2 servings of low-fat dairy each day. ? A serving of nuts, seeds, or beans 5 times each week. ? Heart-healthy fats. Healthy fats called Omega-3 fatty acids are found in foods such as flaxseeds and coldwater fish, like sardines, salmon, and mackerel.  Limit how much you eat of the following: ? Canned or prepackaged foods. ? Food that is high in trans fat, such as fried foods. ? Food that is high in saturated fat, such as fatty meat. ?  Sweets, desserts, sugary drinks, and other foods with added sugar. ? Full-fat dairy products.  Do not salt foods before eating.  Try to eat at least 2 vegetarian meals each week.  Eat more home-cooked food and less restaurant, buffet, and fast food.  When eating at a restaurant, ask that your food be prepared with less salt or no salt, if possible. What foods are recommended? The items listed may not be a complete list. Talk with your dietitian about what dietary choices are best for you. Grains Whole-grain or whole-wheat bread. Whole-grain or whole-wheat pasta. Brown  rice. [name]. Bulgur. Whole-grain and low-sodium cereals. Pita bread. Low-fat, low-sodium crackers. Whole-wheat flour tortillas. Vegetables Fresh or frozen vegetables (raw, steamed, roasted, or grilled). Low-sodium or reduced-sodium tomato and vegetable juice. Low-sodium or reduced-sodium tomato sauce and tomato paste. Low-sodium or reduced-sodium canned vegetables. Fruits All fresh, dried, or frozen fruit. Canned fruit in natural juice (without added sugar). Meat and other protein foods Skinless chicken or [place]. Ground chicken or [place]. Pork with fat trimmed off. Fish and seafood. Egg whites. Dried beans, peas, or lentils. Unsalted nuts, nut butters, and seeds. Unsalted canned beans. Lean cuts of beef with fat trimmed off. Low-sodium, lean deli meat. Dairy Low-fat (1%) or fat-free (skim) milk. Fat-free, low-fat, or reduced-fat cheeses. Nonfat, low-sodium ricotta or cottage cheese. Low-fat or nonfat yogurt. Low-fat, low-sodium cheese. Fats and oils Soft margarine without trans fats. Vegetable oil. Low-fat, reduced-fat, or light mayonnaise and salad dressings (reduced-sodium). Canola, safflower, olive, soybean, and sunflower oils. Avocado. Seasoning and other foods Herbs. Spices. Seasoning mixes without salt. Unsalted popcorn and pretzels. Fat-free sweets. What foods are not recommended? The items listed may not be a complete list. Talk with your dietitian about what dietary choices are best for you. Grains Baked goods made with fat, such as croissants, muffins, or some breads. Dry pasta or rice meal packs. Vegetables Creamed or fried vegetables. Vegetables in a cheese sauce. Regular canned vegetables (not low-sodium or reduced-sodium). Regular canned tomato sauce and paste (not low-sodium or reduced-sodium). Regular tomato and vegetable juice (not low-sodium or reduced-sodium). [name]. Olives. Fruits Canned fruit in a light or heavy syrup. Fried fruit. Fruit in cream or butter  sauce. Meat and other protein foods Fatty cuts of meat. Ribs. Fried meat. [name]. Sausage. Bologna and other processed lunch meats. Salami. Fatback. Hotdogs. Bratwurst. Salted nuts and seeds. Canned beans with added salt. Canned or smoked fish. Whole eggs or egg yolks. Chicken or [place] with skin. Dairy Whole or 2% milk, cream, and half-and-half. Whole or full-fat cream cheese. Whole-fat or sweetened yogurt. Full-fat cheese. Nondairy creamers. Whipped toppings. Processed cheese and cheese spreads. Fats and oils Butter. Stick margarine. Lard. Shortening. Ghee. Bacon fat. Tropical oils, such as coconut, palm kernel, or palm oil. Seasoning and other foods Salted popcorn and pretzels. Onion salt, garlic salt, seasoned salt, table salt, and sea salt. Worcestershire sauce. Tartar sauce. Barbecue sauce. Teriyaki sauce. Soy sauce, including reduced-sodium. Steak sauce. Canned and packaged gravies. Fish sauce. Oyster sauce. Cocktail sauce. Horseradish that you find on the shelf. Ketchup. Mustard. Meat flavorings and tenderizers. Bouillon cubes. Hot sauce and Tabasco sauce. Premade or packaged marinades. Premade or packaged taco seasonings. Relishes. Regular salad dressings. Where to find more information:  National Heart, Lung, and [place]: https://wilson-eaton.com/  American Heart Association: www.heart.org Summary  The DASH eating plan is a healthy eating plan that has been shown to reduce high blood pressure (hypertension). It may also reduce your risk for type 2 diabetes,  heart disease, and stroke.  With the DASH eating plan, you should limit salt (sodium) intake to 2,300 mg a day. If you have hypertension, you may need to reduce your sodium intake to 1,500 mg a day.  When on the DASH eating plan, aim to eat more fresh fruits and vegetables, whole grains, lean proteins, low-fat dairy, and heart-healthy fats.  Work with your health care provider or diet and nutrition specialist (dietitian) to adjust  your eating plan to your individual calorie needs. This information is not intended to replace advice given to you by your health care provider. Make sure you discuss any questions you have with your health care provider. Document Released: 03/30/2011 Document Revised: 04/03/2016 Document Reviewed: 04/03/2016 Elsevier Interactive Patient Education  [employer].

## 2017-05-09 NOTE — Progress Notes (Signed)
Subjective:    Patient ID: Jacob Arnold, male    DOB: [birth year], 57 y.o.   MRN: [id number]  HPI  57 year old patient who is seen today for annual clinical examination. He has a history of dyslipidemia and remains on statin therapy.  He has essential hypertension and a history of impaired glucose tolerance.  Doing quite well Last year his creatinine was a bit elevated and this has been labile over the years. He continues to smoke tobacco and has some mild polycythemia. No new concerns or complaints  Past Medical History:  Diagnosis Date  . ED (erectile dysfunction)   . HYPERLIPIDEMIA   . HYPERTENSION   . Panic attacks   . Tobacco abuse      Social History   Socioeconomic History  . Marital status: Married    Spouse name: Not on file  . Number of children: Not on file  . Years of education: Not on file  . Highest education level: Not on file  Social Needs  . Financial resource strain: Not on file  . Food insecurity - worry: Not on file  . Food insecurity - inability: Not on file  . Transportation needs - medical: Not on file  . Transportation needs - non-medical: Not on file  Occupational History  . Not on file  Tobacco Use  . Smoking status: Current Every Day Smoker    Packs/day: 1.00    Years: 29.00    Pack years: 29.00    Types: Cigarettes  . Smokeless tobacco: Never Used  . Tobacco comment: pt down to 15 cigarettes a day  Substance and Sexual Activity  . Alcohol use: Yes    Alcohol/week: 3.6 oz    Types: 6 Cans of beer per week  . Drug use: No  . Sexual activity: Not on file  Other Topics Concern  . Not on file  Social History Narrative  . Not on file    Past Surgical History:  Procedure Laterality Date  . TONSILLECTOMY    . VASECTOMY      Family History  Problem Relation Age of Onset  . Hyperlipidemia Neg Hx        family hx  . Heart disease Neg Hx        family hx  . Hypertension Neg Hx        family hx  . Cancer Neg Hx        family  hx  . Breast cancer Mother   . Liver cancer Mother   . Diabetes Father     Allergies  Allergen Reactions  . Diphenhydramine Hcl Itching and Swelling    Current Outpatient Medications on File Prior to Visit  Medication Sig Dispense Refill  . aspirin 81 MG tablet Take 1 tablet (81 mg total) by mouth daily. 30 tablet   . atorvastatin (LIPITOR) 40 MG tablet Take 1 tablet (40 mg total) by mouth daily. 90 tablet 4  . chlorthalidone (HYGROTON) 25 MG tablet Take 1 tablet (25 mg total) by mouth daily. 90 tablet 3  . labetalol (NORMODYNE) 200 MG tablet TAKE 2 TABLETS (400 MG TOTAL) BY MOUTH 2 (TWO) TIMES DAILY. 120 tablet 1  . lisinopril (PRINIVIL,ZESTRIL) 40 MG tablet TAKE 1 TABLET (40 MG TOTAL) BY MOUTH DAILY. 30 tablet 8   No current facility-administered medications on file prior to visit.     BP 122/60 (BP Location: Left Arm, Patient Position: Sitting, Cuff Size: Normal)   Pulse 71   Temp 98.1 F (  36.7 C) (Oral)   Ht 5\' 9"  (1.753 m)   Wt 190 lb 3.2 oz (86.3 kg)   SpO2 97%   BMI 28.09 kg/m     Review of Systems  Constitutional: Negative for activity change, appetite change, chills, fatigue and fever.  HENT: Negative for congestion, dental problem, ear pain, hearing loss, mouth sores, rhinorrhea, sinus pressure, sneezing, tinnitus, trouble swallowing and voice change.   Eyes: Negative for photophobia, pain, redness and visual disturbance.  Respiratory: Negative for apnea, cough, choking, chest tightness, shortness of breath and wheezing.   Cardiovascular: Negative for chest pain, palpitations and leg swelling.  Gastrointestinal: Negative for abdominal distention, abdominal pain, anal bleeding, blood in stool, constipation, diarrhea, nausea, rectal pain and vomiting.  Genitourinary: Negative for decreased urine volume, difficulty urinating, discharge, dysuria, flank pain, frequency, genital sores, hematuria, penile swelling, scrotal swelling, testicular pain and urgency.    Musculoskeletal: Negative for arthralgias, back pain, gait problem, joint swelling, myalgias, neck pain and neck stiffness.  Skin: Negative for color change, rash and wound.  Neurological: Negative for dizziness, tremors, seizures, syncope, facial asymmetry, speech difficulty, weakness, light-headedness, numbness and headaches.  Hematological: Negative for adenopathy. Does not bruise/bleed easily.  Psychiatric/Behavioral: Negative for agitation, behavioral problems, confusion, decreased concentration, dysphoric mood, hallucinations, self-injury, sleep disturbance and suicidal ideas. The patient is not nervous/anxious.        Objective:   Physical Exam  Constitutional: He appears well-developed and well-nourished.  HENT:  Head: Normocephalic and atraumatic.  Right Ear: External ear normal.  Left Ear: External ear normal.  Nose: Nose normal.  Mouth/Throat: Oropharynx is clear and moist.  Eyes: Conjunctivae and EOM are normal. Pupils are equal, round, and reactive to light. No scleral icterus.  Neck: Normal range of motion. Neck supple. No JVD present. No thyromegaly present.  Cardiovascular: Regular rhythm, normal heart sounds and intact distal pulses. Exam reveals no gallop and no friction rub.  No murmur heard. Pulmonary/Chest: Effort normal and breath sounds normal. He exhibits no tenderness.  Abdominal: Soft. Bowel sounds are normal. He exhibits no distension and no mass. There is no tenderness.  Genitourinary: Prostate normal and penis normal. Rectal exam shows guaiac negative stool.  Musculoskeletal: Normal range of motion. He exhibits no edema or tenderness.  Lymphadenopathy:    He has no cervical adenopathy.  Neurological: He is alert. He has normal reflexes. No cranial nerve deficit. Coordination normal.  Skin: Skin is warm and dry. No rash noted.  Psychiatric: He has a normal mood and affect. His behavior is normal.          Assessment & Plan:   Preventive health  examination Essential hypertension well-controlled Ongoing tobacco use.  Total smoking cessation encouraged Dyslipidemia.  Continue statin therapy review lipid profile History of hypertriglyceridemia continue fish oil.  Weight loss better diet encouraged History of chronic kidney disease.  Will review renal indices Impaired glucose tolerance.  Will check hemoglobin A1c Secondary polycythemia History of elevated PSA.  Will review  Jacob Arnold

## 2017-05-10 LAB — HEPATITIS C ANTIBODY
HEP C AB: NONREACTIVE
SIGNAL TO CUT-OFF: 0.02 (ref <1.00)

## 2017-06-01 ENCOUNTER — Other Ambulatory Visit: Payer: Self-pay | Admitting: Internal Medicine

## 2017-07-06 ENCOUNTER — Other Ambulatory Visit: Payer: Self-pay

## 2017-07-06 MED ORDER — LABETALOL HCL 200 MG PO TABS: 400.0000 mg | ORAL_TABLET | Freq: Two times a day (BID) | ORAL | 2 refills | Status: DC

## 2017-08-12 ENCOUNTER — Other Ambulatory Visit: Payer: Self-pay | Admitting: Internal Medicine

## 2017-10-03 ENCOUNTER — Ambulatory Visit: Payer: BLUE CROSS/BLUE SHIELD | Admitting: Internal Medicine

## 2017-10-03 ENCOUNTER — Encounter: Payer: Self-pay | Admitting: Internal Medicine

## 2017-10-03 VITALS — BP 110/70 | HR 70 | Temp 97.6°F | Wt 186.0 lb

## 2017-10-03 DIAGNOSIS — M109 Gout, unspecified: Secondary | ICD-10-CM | POA: Insufficient documentation

## 2017-10-03 LAB — URIC ACID: Uric Acid, Serum: 10.3 mg/dL — ABNORMAL HIGH (ref 4.0–7.8)

## 2017-10-03 MED ORDER — ALLOPURINOL 100 MG PO TABS: 100.0000 mg | ORAL_TABLET | Freq: Every day | ORAL | 6 refills | Status: DC

## 2017-10-03 MED ORDER — LOSARTAN POTASSIUM 100 MG PO TABS
100.0000 mg | ORAL_TABLET | Freq: Every day | ORAL | 3 refills | Status: DC
Start: 2017-10-03 — End: 2018-05-28

## 2017-10-03 MED ORDER — PREDNISONE 20 MG PO TABS: 20.0000 mg | ORAL_TABLET | Freq: Two times a day (BID) | ORAL | 0 refills | Status: DC

## 2017-10-03 NOTE — Patient Instructions (Addendum)
Low-Purine Diet Purines are compounds that affect the level of uric acid in your body. A low-purine diet is a diet that is low in purines. Eating a low-purine diet can prevent the level of uric acid in your body from getting too high and causing gout or kidney stones or both. What do I need to know about this diet?  Choose low-purine foods. Examples of low-purine foods are listed in the next section.  Drink plenty of fluids, especially water. Fluids can help remove uric acid from your body. Try to drink 8-16 cups (1.9-3.8 L) a day.  Limit foods high in fat, especially saturated fat, as fat makes it harder for the body to get rid of uric acid. Foods high in saturated fat include pizza, cheese, ice cream, whole milk, fried foods, and gravies. Choose foods that are lower in fat and lean sources of protein. Use olive oil when cooking as it contains healthy fats that are not high in saturated fat.  Limit alcohol. Alcohol interferes with the elimination of uric acid from your body. If you are having a gout attack, avoid all alcohol.  Keep in mind that different people's bodies react differently to different foods. You will probably learn over time which foods do or do not affect you. If you discover that a food tends to cause your gout to flare up, avoid eating that food. You can more freely enjoy foods that do not cause problems. If you have any questions about a food item, talk to your dietitian or health care provider. Which foods are low, moderate, and high in purines? The following is a list of foods that are low, moderate, and high in purines. You can eat any amount of the foods that are low in purines. You may be able to have small amounts of foods that are moderate in purines. Ask your health care provider how much of a food moderate in purines you can have. Avoid foods high in purines. Grains  Foods low in purines: Enriched white bread, pasta, rice, cake, cornbread, popcorn.  Foods moderate in  purines: Whole-grain breads and cereals, wheat germ, bran, oatmeal. Uncooked oatmeal. Dry wheat bran or wheat germ.  Foods high in purines: Pancakes, French toast, biscuits, muffins. Vegetables  Foods low in purines: All vegetables, except those that are moderate in purines.  Foods moderate in purines: Asparagus, cauliflower, spinach, mushrooms, green peas. Fruits  All fruits are low in purines. Meats and other Protein Foods  Foods low in purines: Eggs, nuts, peanut butter.  Foods moderate in purines: 80-90% lean beef, lamb, veal, pork, poultry, fish, eggs, peanut butter, nuts. Crab, lobster, oysters, and shrimp. Cooked dried beans, peas, and lentils.  Foods high in purines: Anchovies, sardines, herring, mussels, tuna, codfish, scallops, trout, and haddock. Bacon. Organ meats (such as liver or kidney). Tripe. Game meat. Goose. Sweetbreads. Dairy  All dairy foods are low in purines. Low-fat and fat-free dairy products are best because they are low in saturated fat. Beverages  Drinks low in purines: Water, carbonated beverages, tea, coffee, cocoa.  Drinks moderate in purines: Soft drinks and other drinks sweetened with high-fructose corn syrup. Juices. To find whether a food or drink is sweetened with high-fructose corn syrup, look at the ingredients list.  Drinks high in purines: Alcoholic beverages (such as beer). Condiments  Foods low in purines: Salt, herbs, olives, pickles, relishes, vinegar.  Foods moderate in purines: Butter, margarine, oils, mayonnaise. Fats and Oils  Foods low in purines: All types, except gravies   and sauces made with meat.  Foods high in purines: Gravies and sauces made with meat. Other Foods  Foods low in purines: Sugars, sweets, gelatin. Cake. Soups made without meat.  Foods moderate in purines: Meat-based or fish-based soups, broths, or bouillons. Foods and drinks sweetened with high-fructose corn syrup.  Foods high in purines: High-fat desserts  (such as ice cream, cookies, cakes, pies, doughnuts, and chocolate). Contact your dietitian for more information on foods that are not listed here. This information is not intended to replace advice given to you by your health care provider. Make sure you discuss any questions you have with your health care provider. Document Released: 08/05/2010 Document Revised: 09/16/2015 Document Reviewed: 03/17/2013 Elsevier Interactive Patient Education  2017 [employer].  Gout Gout is painful swelling that can happen in some of your joints. Gout is a type of arthritis. This condition is caused by having too much uric acid in your body. Uric acid is a chemical that is made when your body breaks down substances called purines. If your body has too much uric acid, sharp crystals can form and build up in your joints. This causes pain and swelling. Gout attacks can happen quickly and be very painful (acute gout). Over time, the attacks can affect more joints and happen more often (chronic gout). Follow these instructions at home: During a Gout Attack  If directed, put ice on the painful area: ? Put ice in a plastic bag. ? Place a towel between your skin and the bag. ? Leave the ice on for 20 minutes, 2-3 times a day.  Rest the joint as much as possible. If the joint is in your leg, you may be given crutches to use.  Raise (elevate) the painful joint above the level of your heart as often as you can.  Drink enough fluids to keep your pee (urine) clear or pale yellow.  Take over-the-counter and prescription medicines only as told by your doctor.  Do not drive or use heavy machinery while taking prescription pain medicine.  Follow instructions from your doctor about what you can or cannot eat and drink.  Return to your normal activities as told by your doctor. Ask your doctor what activities are safe for you. Avoiding Future Gout Attacks  Follow a low-purine diet as told by a specialist (dietitian)  or your doctor. Avoid foods and drinks that have a lot of purines, such as: ? Liver. ? Kidney. ? Anchovies. ? Asparagus. ? Herring. ? Mushrooms ? Mussels. ? Beer.  Limit alcohol intake to no more than 1 drink a day for nonpregnant women and 2 drinks a day for men. One drink equals 12 oz of beer, 5 oz of wine, or 1 oz of hard liquor.  Stay at a healthy weight or lose weight if you are overweight. If you want to lose weight, talk with your doctor. It is important that you do not lose weight too fast.  Start or continue an exercise plan as told by your doctor.  Drink enough fluids to keep your pee clear or pale yellow.  Take over-the-counter and prescription medicines only as told by your doctor.  Keep all follow-up visits as told by your doctor. This is important. Contact a doctor if:  You have another gout attack.  You still have symptoms of a gout attack after10 days of treatment.  You have problems (side effects) because of your medicines.  You have chills or a fever.  You have burning pain when you  pee (urinate).  You have pain in your lower back or belly. Get help right away if:  You have very bad pain.  Your pain cannot be controlled.  You cannot pee.   Please check your blood pressure on a regular basis.  If it is consistently greater than 150/90, please make an office appointment.

## 2017-10-03 NOTE — Progress Notes (Signed)
Subjective:    Patient ID: Jacob Arnold, male    DOB: [birth year], 57 y.o.   MRN: [id number]  HPI  57 year old patient who is seen today for follow-up of gouty arthritis;  he had his first episode in 2012 when he had acute gout involving his great toe.  Over the past 6 months he has had recurrent gout.  Most recently he had the onset of gout involving the right foot and over the weekend onset of pain involving his left foot and ankle.  Over the weekend symptoms were too painful to ambulate. Medical regimen includes thiazide diuretics  Past Medical History:  Diagnosis Date  . ED (erectile dysfunction)   . HYPERLIPIDEMIA   . HYPERTENSION   . Panic attacks   . Tobacco abuse      Social History   Socioeconomic History  . Marital status: Married    Spouse name: Not on file  . Number of children: Not on file  . Years of education: Not on file  . Highest education level: Not on file  Occupational History  . Not on file  Social Needs  . Financial resource strain: Not on file  . Food insecurity:    Worry: Not on file    Inability: Not on file  . Transportation needs:    Medical: Not on file    Non-medical: Not on file  Tobacco Use  . Smoking status: Current Every Day Smoker    Packs/day: 1.00    Years: 29.00    Pack years: 29.00    Types: Cigarettes  . Smokeless tobacco: Never Used  . Tobacco comment: pt down to 15 cigarettes a day  Substance and Sexual Activity  . Alcohol use: Yes    Alcohol/week: 3.6 oz    Types: 6 Cans of beer per week  . Drug use: No  . Sexual activity: Not on file  Lifestyle  . Physical activity:    Days per week: Not on file    Minutes per session: Not on file  . Stress: Not on file  Relationships  . Social connections:    Talks on phone: Not on file    Gets together: Not on file    Attends religious service: Not on file    Active member of club or organization: Not on file    Attends meetings of clubs or organizations: Not on file   Relationship status: Not on file  . Intimate partner violence:    Fear of current or ex partner: Not on file    Emotionally abused: Not on file    Physically abused: Not on file    Forced sexual activity: Not on file  Other Topics Concern  . Not on file  Social History Narrative  . Not on file    Past Surgical History:  Procedure Laterality Date  . TONSILLECTOMY    . VASECTOMY      Family History  Problem Relation Age of Onset  . Hyperlipidemia Neg Hx        family hx  . Heart disease Neg Hx        family hx  . Hypertension Neg Hx        family hx  . Cancer Neg Hx        family hx  . Breast cancer Mother   . Liver cancer Mother   . Diabetes Father     Allergies  Allergen Reactions  . Diphenhydramine Hcl Itching and Swelling  Current Outpatient Medications on File Prior to Visit  Medication Sig Dispense Refill  . aspirin 81 MG tablet Take 1 tablet (81 mg total) by mouth daily. 30 tablet   . atorvastatin (LIPITOR) 40 MG tablet TAKE 1 TABLET (40 MG TOTAL) BY MOUTH DAILY. 90 tablet 0  . labetalol (NORMODYNE) 200 MG tablet Take 2 tablets (400 mg total) by mouth 2 (two) times daily. 360 tablet 2   No current facility-administered medications on file prior to visit.     BP 110/70 (BP Location: Right Arm, Patient Position: Sitting, Cuff Size: Large)   Pulse 70   Temp 97.6 F (36.4 C) (Oral)   Wt 186 lb (84.4 kg)   SpO2 98%   BMI 27.47 kg/m     Review of Systems  Constitutional: Negative for appetite change, chills, fatigue and fever.  HENT: Negative for congestion, dental problem, ear pain, hearing loss, sore throat, tinnitus, trouble swallowing and voice change.   Eyes: Negative for pain, discharge and visual disturbance.  Respiratory: Negative for cough, chest tightness, wheezing and stridor.   Cardiovascular: Negative for chest pain, palpitations and leg swelling.  Gastrointestinal: Negative for abdominal distention, abdominal pain, blood in stool,  constipation, diarrhea, nausea and vomiting.  Genitourinary: Negative for difficulty urinating, discharge, flank pain, genital sores, hematuria and urgency.  Musculoskeletal: Positive for arthralgias. Negative for back pain, gait problem, joint swelling, myalgias and neck stiffness.  Skin: Negative for rash.  Neurological: Negative for dizziness, syncope, speech difficulty, weakness, numbness and headaches.  Hematological: Negative for adenopathy. Does not bruise/bleed easily.  Psychiatric/Behavioral: Negative for behavioral problems and dysphoric mood. The patient is not nervous/anxious.        Objective:   Physical Exam  Constitutional: He appears well-developed and well-nourished.  Musculoskeletal:  Soft tissue swelling pain and erythema involving the dorsal aspects of both feet.  Maximum soft tissue swelling involving the left great toe area and left lateral ankle          Assessment & Plan:   Recurrent gouty arthritis.  Will place on a low purine diet Discontinue chlorthalidone.  Hopeful to control blood pressure on 2 drugs and avoid diuretic therapy Substitute losartan (uricosuric) for lisinopril (uric acid neutral)  Treat with short-term oral prednisone Check uric acid level Initiate allopurinol 100 mg in 4 weeks  follow-up 3 months  Marletta Lor

## 2017-12-02 ENCOUNTER — Other Ambulatory Visit: Payer: Self-pay | Admitting: Internal Medicine

## 2017-12-09 ENCOUNTER — Other Ambulatory Visit: Payer: Self-pay | Admitting: Internal Medicine

## 2018-01-03 ENCOUNTER — Encounter: Payer: Self-pay | Admitting: Internal Medicine

## 2018-01-03 ENCOUNTER — Ambulatory Visit: Payer: BLUE CROSS/BLUE SHIELD | Admitting: Internal Medicine

## 2018-01-03 VITALS — BP 140/86 | HR 65 | Temp 98.2°F | Wt 186.8 lb

## 2018-01-03 DIAGNOSIS — M109 Gout, unspecified: Secondary | ICD-10-CM | POA: Diagnosis not present

## 2018-01-03 DIAGNOSIS — I10 Essential (primary) hypertension: Secondary | ICD-10-CM | POA: Diagnosis not present

## 2018-01-03 DIAGNOSIS — R7302 Impaired glucose tolerance (oral): Secondary | ICD-10-CM

## 2018-01-03 LAB — URIC ACID: Uric Acid, Serum: 4.8 mg/dL (ref 4.0–7.8)

## 2018-01-03 MED ORDER — ALLOPURINOL 100 MG PO TABS: 200.0000 mg | ORAL_TABLET | Freq: Every day | ORAL | 6 refills | Status: DC

## 2018-01-03 MED ORDER — AMLODIPINE BESYLATE 5 MG PO TABS: 5.0000 mg | ORAL_TABLET | Freq: Every day | ORAL | 3 refills | Status: DC

## 2018-01-03 MED ORDER — PREDNISONE 10 MG PO TABS: ORAL_TABLET | ORAL | 0 refills | Status: DC

## 2018-01-03 NOTE — Patient Instructions (Signed)
Limit your sodium (Salt) intake  Please check your blood pressure on a regular basis.  If it is consistently greater than 140/90, please make an office appointment.  Return in 6 months for follow-up

## 2018-01-03 NOTE — Progress Notes (Signed)
Subjective:    Patient ID: Jacob Arnold, male    DOB: [birth year], 57 y.o.   MRN: [id number]  HPI  BP Readings from Last 3 Encounters:  01/03/18 140/86  10/03/17 110/70  05/09/17 29/12   57 year old patient who is seen today for follow-up of hypertension and recurrent gouty arthritis  3 months ago, due to recurrent gout diuretic therapy was discontinued.  Blood pressure readings have clearly been more elevated since discontinuation of diuretic therapy.  Blood pressure today 150/90  Due to recurrent gout, 3 months ago diuretic therapy was discontinued and losartan due to the uricosuric effect was substituted for lisinopril.  After 4 weeks he was placed on allopurinol 100 mg daily.  Uric acid level was greater than 10 Since his last visit he has had one recurrent attack of gout that required short burst of steroid therapy.  He has been adhering to a low purine diet  Past Medical History:  Diagnosis Date  . ED (erectile dysfunction)   . HYPERLIPIDEMIA   . HYPERTENSION   . Panic attacks   . Tobacco abuse      Social History   Socioeconomic History  . Marital status: Married    Spouse name: Not on file  . Number of children: Not on file  . Years of education: Not on file  . Highest education level: Not on file  Occupational History  . Not on file  Social Needs  . Financial resource strain: Not on file  . Food insecurity:    Worry: Not on file    Inability: Not on file  . Transportation needs:    Medical: Not on file    Non-medical: Not on file  Tobacco Use  . Smoking status: Current Every Day Smoker    Packs/day: 1.00    Years: 29.00    Pack years: 29.00    Types: Cigarettes  . Smokeless tobacco: Never Used  . Tobacco comment: pt down to 15 cigarettes a day  Substance and Sexual Activity  . Alcohol use: Yes    Alcohol/week: 6.0 standard drinks    Types: 6 Cans of beer per week  . Drug use: No  . Sexual activity: Not on file  Lifestyle  . Physical  activity:    Days per week: Not on file    Minutes per session: Not on file  . Stress: Not on file  Relationships  . Social connections:    Talks on phone: Not on file    Gets together: Not on file    Attends religious service: Not on file    Active member of club or organization: Not on file    Attends meetings of clubs or organizations: Not on file    Relationship status: Not on file  . Intimate partner violence:    Fear of current or ex partner: Not on file    Emotionally abused: Not on file    Physically abused: Not on file    Forced sexual activity: Not on file  Other Topics Concern  . Not on file  Social History Narrative  . Not on file    Past Surgical History:  Procedure Laterality Date  . TONSILLECTOMY    . VASECTOMY      Family History  Problem Relation Age of Onset  . Hyperlipidemia Neg Hx        family hx  . Heart disease Neg Hx        family hx  . Hypertension Neg Hx  family hx  . Cancer Neg Hx        family hx  . Breast cancer Mother   . Liver cancer Mother   . Diabetes Father     Allergies  Allergen Reactions  . Diphenhydramine Hcl Itching and Swelling    Current Outpatient Medications on File Prior to Visit  Medication Sig Dispense Refill  . allopurinol (ZYLOPRIM) 100 MG tablet Take 1 tablet (100 mg total) by mouth daily. 90 tablet 6  . aspirin 81 MG tablet Take 1 tablet (81 mg total) by mouth daily. 30 tablet   . atorvastatin (LIPITOR) 40 MG tablet TAKE 1 TABLET BY MOUTH DAILY. 30 tablet 2  . labetalol (NORMODYNE) 200 MG tablet Take 2 tablets (400 mg total) by mouth 2 (two) times daily. 360 tablet 2  . losartan (COZAAR) 100 MG tablet Take 1 tablet (100 mg total) by mouth daily. 90 tablet 3   No current facility-administered medications on file prior to visit.     BP 140/86 (BP Location: Right Arm, Patient Position: Sitting, Cuff Size: Normal)   Pulse 65   Temp 98.2 F (36.8 C) (Oral)   Wt 186 lb 12.8 oz (84.7 kg)   SpO2 96%    BMI 27.59 kg/m     Review of Systems  Constitutional: Negative for appetite change, chills, fatigue and fever.  HENT: Negative for congestion, dental problem, ear pain, hearing loss, sore throat, tinnitus, trouble swallowing and voice change.   Eyes: Negative for pain, discharge and visual disturbance.  Respiratory: Negative for cough, chest tightness, wheezing and stridor.   Cardiovascular: Negative for chest pain, palpitations and leg swelling.  Gastrointestinal: Negative for abdominal distention, abdominal pain, blood in stool, constipation, diarrhea, nausea and vomiting.  Genitourinary: Negative for difficulty urinating, discharge, flank pain, genital sores, hematuria and urgency.  Musculoskeletal: Positive for arthralgias and joint swelling. Negative for back pain, gait problem, myalgias and neck stiffness.  Skin: Negative for rash.  Neurological: Negative for dizziness, syncope, speech difficulty, weakness, numbness and headaches.  Hematological: Negative for adenopathy. Does not bruise/bleed easily.  Psychiatric/Behavioral: Negative for behavioral problems and dysphoric mood. The patient is not nervous/anxious.        Objective:   Physical Exam  Constitutional: He appears well-developed and well-nourished. No distress.  Blood pressure 150/90          Assessment & Plan:   Essential hypertension.  Suboptimal control off diuretic therapy.  Will add amlodipine 5 mg daily.  Continue home blood pressure monitoring Recurrent gout.  Will check uric acid level.  Likely will need up titration of allopurinol  Follow-up 6 months  Marletta Lor

## 2018-03-24 ENCOUNTER — Other Ambulatory Visit: Payer: Self-pay | Admitting: Internal Medicine

## 2018-03-26 ENCOUNTER — Encounter: Payer: Self-pay | Admitting: Internal Medicine

## 2018-03-26 ENCOUNTER — Ambulatory Visit: Payer: BLUE CROSS/BLUE SHIELD | Admitting: Internal Medicine

## 2018-03-26 ENCOUNTER — Other Ambulatory Visit: Payer: Self-pay

## 2018-03-26 VITALS — BP 148/98 | HR 82 | Temp 97.7°F | Ht 69.0 in | Wt 198.1 lb

## 2018-03-26 DIAGNOSIS — R7302 Impaired glucose tolerance (oral): Secondary | ICD-10-CM

## 2018-03-26 DIAGNOSIS — N183 Chronic kidney disease, stage 3 unspecified: Secondary | ICD-10-CM

## 2018-03-26 DIAGNOSIS — I10 Essential (primary) hypertension: Secondary | ICD-10-CM

## 2018-03-26 DIAGNOSIS — E785 Hyperlipidemia, unspecified: Secondary | ICD-10-CM

## 2018-03-26 DIAGNOSIS — F17200 Nicotine dependence, unspecified, uncomplicated: Secondary | ICD-10-CM

## 2018-03-26 DIAGNOSIS — M109 Gout, unspecified: Secondary | ICD-10-CM

## 2018-03-26 LAB — BASIC METABOLIC PANEL
BUN: 12 mg/dL (ref 6–23)
CHLORIDE: 100 meq/L (ref 96–112)
CO2: 27 meq/L (ref 19–32)
Calcium: 9.8 mg/dL (ref 8.4–10.5)
Creatinine, Ser: 1.32 mg/dL (ref 0.40–1.50)
GFR: 59.57 mL/min — ABNORMAL LOW (ref >60.00)
GLUCOSE: 111 mg/dL — AB (ref 70–99)
POTASSIUM: 3.7 meq/L (ref 3.5–5.1)
SODIUM: 136 meq/L (ref 135–145)

## 2018-03-26 MED ORDER — PREDNISONE 10 MG PO TABS: ORAL_TABLET | ORAL | 0 refills | Status: DC

## 2018-03-26 MED ORDER — AMLODIPINE BESYLATE 10 MG PO TABS: 10.0000 mg | ORAL_TABLET | Freq: Every day | ORAL | 2 refills | Status: DC

## 2018-03-26 NOTE — Addendum Note (Signed)
Addended by: Wyvonne Lenz on: 03/26/2018 08:57 AM   Modules accepted: Orders

## 2018-03-26 NOTE — Progress Notes (Signed)
Established Patient Office Visit     CC/Reason for Visit: Establish care, blood pressure follow-up  HPI: Jacob Arnold is a 57 y.o. male who is coming in today for the above mentioned reasons.  Due for annual physical in January 2020.  Past Medical History is significant for: Hypertension, hyperlipidemia, impaired glucose tolerance, ongoing tobacco abuse.  Due to recurrent gouty attacks his diuretic therapy was discontinued and due to suboptimal blood pressure control, during his last visit in September amlodipine 5 mg daily was added.  He does ambulatory blood pressure monitoring at home and states that his blood pressure has remained in the 140/90 range.  His gout frequency has decreased.  He last had an episode 2 weeks ago but notes that these are less intense.  He has a prescription for prednisone that he uses as needed when he has a gouty attack and would like this refilled.  He continues to smoke a pack a day and has been vaping as well.   Past Medical/Surgical History: Past Medical History:  Diagnosis Date  . ED (erectile dysfunction)   . HYPERLIPIDEMIA   . HYPERTENSION   . Panic attacks   . Tobacco abuse     Past Surgical History:  Procedure Laterality Date  . TONSILLECTOMY    . VASECTOMY      Social History:  reports that he has been smoking cigarettes. He has a 29.00 pack-year smoking history. He has never used smokeless tobacco. He reports that he drinks about 6.0 standard drinks of alcohol per week. He reports that he does not use drugs.  Allergies: Allergies  Allergen Reactions  . Diphenhydramine Hcl Itching and Swelling    Family History:  Family History  Problem Relation Age of Onset  . Breast cancer Mother   . Liver cancer Mother   . Diabetes Father   . Hyperlipidemia Neg Hx        family hx  . Heart disease Neg Hx        family hx  . Hypertension Neg Hx        family hx  . Cancer Neg Hx        family hx     Current Outpatient  Medications:  .  allopurinol (ZYLOPRIM) 100 MG tablet, Take 2 tablets (200 mg total) by mouth daily., Disp: 180 tablet, Rfl: 6 .  amLODipine (NORVASC) 10 MG tablet, Take 1 tablet (10 mg total) by mouth daily., Disp: 30 tablet, Rfl: 2 .  aspirin 81 MG tablet, Take 1 tablet (81 mg total) by mouth daily., Disp: 30 tablet, Rfl:  .  atorvastatin (LIPITOR) 40 MG tablet, TAKE 1 TABLET BY MOUTH DAILY., Disp: 30 tablet, Rfl: 2 .  labetalol (NORMODYNE) 200 MG tablet, Take 2 tablets (400 mg total) by mouth 2 (two) times daily., Disp: 360 tablet, Rfl: 2 .  losartan (COZAAR) 100 MG tablet, Take 1 tablet (100 mg total) by mouth daily., Disp: 90 tablet, Rfl: 3 .  predniSONE (DELTASONE) 10 MG tablet, TAKE 2 TABLETS (20 MG TOTAL) BY MOUTH 2 (TWO) TIMES DAILY WITH A MEAL when needed for gout attack.  Do not take for more than 5 days., Disp: 60 tablet, Rfl: 0  Review of Systems:  Constitutional: Denies fever, chills, diaphoresis, appetite change and fatigue.  HEENT: Denies photophobia, eye pain, redness, hearing loss, ear pain, congestion, sore throat, rhinorrhea, sneezing, mouth sores, trouble swallowing, neck pain, neck stiffness and tinnitus.   Respiratory: Denies SOB, DOE, cough, chest tightness,  and wheezing.   Cardiovascular: Denies chest pain, palpitations and leg swelling.  Gastrointestinal: Denies nausea, vomiting, abdominal pain, diarrhea, constipation, blood in stool and abdominal distention.  Genitourinary: Denies dysuria, urgency, frequency, hematuria, flank pain and difficulty urinating.  Endocrine: Denies: hot or cold intolerance, sweats, changes in hair or nails, polyuria, polydipsia. Musculoskeletal: Denies myalgias, back pain, joint swelling, arthralgias and gait problem.  Skin: Denies pallor, rash and wound.  Neurological: Denies dizziness, seizures, syncope, weakness, light-headedness, numbness and headaches.  Hematological: Denies adenopathy. Easy bruising, personal or family bleeding history    Psychiatric/Behavioral: Denies suicidal ideation, mood changes, confusion, nervousness, sleep disturbance and agitation    Physical Exam: Vitals:   03/26/18 0755  BP: (!) 148/98  Pulse: 82  Temp: 97.7 F (36.5 C)  TempSrc: Oral  SpO2: 98%  Weight: 198 lb 1.6 oz (89.9 kg)  Height: 5\' 9"  (1.753 m)    Body mass index is 29.25 kg/m.    Constitutional: NAD, calm, comfortable Eyes: PERRL, lids and conjunctivae normal ENMT: Mucous membranes are moist. Posterior pharynx clear of any exudate or lesions. Normal dentition.  Neck: normal, supple, no masses, no thyromegaly Respiratory: clear to auscultation bilaterally, no wheezing, no crackles. Normal respiratory effort. No accessory muscle use.  Cardiovascular: Regular rate and rhythm, no murmurs / rubs / gallops. No extremity edema. 2+ pedal pulses. No carotid bruits.  Abdomen: no tenderness, no masses palpated. No hepatosplenomegaly. Bowel sounds positive.  Musculoskeletal: no clubbing / cyanosis. No joint deformity upper and lower extremities. Good ROM, no contractures. Normal muscle tone.  Skin: no rashes, lesions, ulcers. No induration Neurologic: Grossly intact and nonfocal Psychiatric: Normal judgment and insight. Alert and oriented x 3. Normal mood.    Impression and Plan:  Dyslipidemia -On atorvastatin 40. -Due for repeat in February with annual physical.  Essential hypertension  -Uncontrolled. -Increase amlodipine from 5 to 10 mg. -Will return in 8 to 12 weeks for repeat blood pressure check.  Stage 3 chronic kidney disease (HCC) -Baseline creatinine appears to be between 1.5 and 1.7. -Has not been checked in over a year. -BMET today to follow renal function and electrolytes.  Tobacco use disorder -I have discussed tobacco cessation with the patient.  I have counseled the patient regarding the negative impacts of continued tobacco use including but not limited to lung cancer, COPD, and cardiovascular disease.  I  have discussed alternatives to tobacco and modalities that may help facilitate tobacco cessation including but not limited to biofeedback, hypnosis, and medications.  Total time spent with tobacco counseling was 4 minutes. -He does not appear interested in cessation today, but we will continue to address at subsequent visits.   Impaired glucose tolerance -Will check fasting blood sugar with annual physical in February 2020.  Gouty arthritis  -Attacks are less frequent since starting allopurinol and getting off hydrochlorothiazide. -He has a backup prescription for prednisone that he uses in case of attacks and would like this refilled today.  Have discussed that he should only use this for a max of 5 days. -Have also discussed that we can increase dose of allopurinol if he notes continued gouty attacks.     Patient Instructions  -It was nice meeting you!  -Increase your norvasc to 10 mg daily.  -Come back and see me after February for your annual physical and blood pressure check.  Low-Purine Diet Purines are compounds that affect the level of uric acid in your body. A low-purine diet is a diet that is low in purines.  Eating a low-purine diet can prevent the level of uric acid in your body from getting too high and causing gout or kidney stones or both. What do I need to know about this diet?  Choose low-purine foods. Examples of low-purine foods are listed in the next section.  Drink plenty of fluids, especially water. Fluids can help remove uric acid from your body. Try to drink 8-16 cups (1.9-3.8 L) a day.  Limit foods high in fat, especially saturated fat, as fat makes it harder for the body to get rid of uric acid. Foods high in saturated fat include pizza, cheese, ice cream, whole milk, fried foods, and gravies. Choose foods that are lower in fat and lean sources of protein. Use olive oil when cooking as it contains healthy fats that are not high in saturated fat.  Limit  alcohol. Alcohol interferes with the elimination of uric acid from your body. If you are having a gout attack, avoid all alcohol.  Keep in mind that different people's bodies react differently to different foods. You will probably learn over time which foods do or do not affect you. If you discover that a food tends to cause your gout to flare up, avoid eating that food. You can more freely enjoy foods that do not cause problems. If you have any questions about a food item, talk to your dietitian or health care provider. Which foods are low, moderate, and high in purines? The following is a list of foods that are low, moderate, and high in purines. You can eat any amount of the foods that are low in purines. You may be able to have small amounts of foods that are moderate in purines. Ask your health care provider how much of a food moderate in purines you can have. Avoid foods high in purines. Grains  Foods low in purines: Enriched white bread, pasta, rice, cake, cornbread, popcorn.  Foods moderate in purines: Whole-grain breads and cereals, wheat germ, bran, oatmeal. Uncooked oatmeal. Dry wheat bran or wheat germ.  Foods high in purines: Pancakes, [place] toast, biscuits, muffins. Vegetables  Foods low in purines: All vegetables, except those that are moderate in purines.  Foods moderate in purines: Asparagus, cauliflower, spinach, mushrooms, green peas. Fruits  All fruits are low in purines. Meats and other Protein Foods  Foods low in purines: Eggs, nuts, peanut butter.  Foods moderate in purines: 80-90% lean beef, lamb, veal, pork, poultry, fish, eggs, peanut butter, nuts. Crab, lobster, oysters, and shrimp. Cooked dried beans, peas, and lentils.  Foods high in purines: Anchovies, sardines, herring, mussels, tuna, codfish, scallops, trout, and haddock. Jacob Arnold. Organ meats (such as liver or kidney). [place]. Game meat. Goose. Sweetbreads. Dairy  All dairy foods are low in purines. Low-fat  and fat-free dairy products are best because they are low in saturated fat. Beverages  Drinks low in purines: Water, carbonated beverages, tea, coffee, cocoa.  Drinks moderate in purines: Soft drinks and other drinks sweetened with high-fructose corn syrup. Juices. To find whether a food or drink is sweetened with high-fructose corn syrup, look at the ingredients list.  Drinks high in purines: Alcoholic beverages (such as beer). Condiments  Foods low in purines: Salt, herbs, olives, pickles, relishes, vinegar.  Foods moderate in purines: Butter, margarine, oils, mayonnaise. Fats and Oils  Foods low in purines: All types, except gravies and sauces made with meat.  Foods high in purines: Gravies and sauces made with meat. Other Foods  Foods low in purines: Sugars, sweets,  gelatin. Cake. Soups made without meat.  Foods moderate in purines: Meat-based or fish-based soups, broths, or bouillons. Foods and drinks sweetened with high-fructose corn syrup.  Foods high in purines: High-fat desserts (such as ice cream, cookies, cakes, pies, doughnuts, and chocolate). Contact your dietitian for more information on foods that are not listed here. This information is not intended to replace advice given to you by your health care provider. Make sure you discuss any questions you have with your health care provider. Document Released: 08/05/2010 Document Revised: 09/16/2015 Document Reviewed: 03/17/2013 Elsevier Interactive Patient Education  2017 [employer]      Lelon Frohlich, MD [place] [facility]

## 2018-03-26 NOTE — Patient Instructions (Addendum)
-It was nice meeting you!  -Increase your norvasc to 10 mg daily.  -No vaping please.  Recent reports with a lot of lung issues related to this.  -Come back and see me after February for your annual physical and blood pressure check.  Low-Purine Diet Purines are compounds that affect the level of uric acid in your body. A low-purine diet is a diet that is low in purines. Eating a low-purine diet can prevent the level of uric acid in your body from getting too high and causing gout or kidney stones or both. What do I need to know about this diet?  Choose low-purine foods. Examples of low-purine foods are listed in the next section.  Drink plenty of fluids, especially water. Fluids can help remove uric acid from your body. Try to drink 8-16 cups (1.9-3.8 L) a day.  Limit foods high in fat, especially saturated fat, as fat makes it harder for the body to get rid of uric acid. Foods high in saturated fat include pizza, cheese, ice cream, whole milk, fried foods, and gravies. Choose foods that are lower in fat and lean sources of protein. Use olive oil when cooking as it contains healthy fats that are not high in saturated fat.  Limit alcohol. Alcohol interferes with the elimination of uric acid from your body. If you are having a gout attack, avoid all alcohol.  Keep in mind that different people's bodies react differently to different foods. You will probably learn over time which foods do or do not affect you. If you discover that a food tends to cause your gout to flare up, avoid eating that food. You can more freely enjoy foods that do not cause problems. If you have any questions about a food item, talk to your dietitian or health care provider. Which foods are low, moderate, and high in purines? The following is a list of foods that are low, moderate, and high in purines. You can eat any amount of the foods that are low in purines. You may be able to have small amounts of foods that are  moderate in purines. Ask your health care provider how much of a food moderate in purines you can have. Avoid foods high in purines. Grains  Foods low in purines: Enriched white bread, pasta, rice, cake, cornbread, popcorn.  Foods moderate in purines: Whole-grain breads and cereals, wheat germ, bran, oatmeal. Uncooked oatmeal. Dry wheat bran or wheat germ.  Foods high in purines: Pancakes, [place] toast, biscuits, muffins. Vegetables  Foods low in purines: All vegetables, except those that are moderate in purines.  Foods moderate in purines: Asparagus, cauliflower, spinach, mushrooms, green peas. Fruits  All fruits are low in purines. Meats and other Protein Foods  Foods low in purines: Eggs, nuts, peanut butter.  Foods moderate in purines: 80-90% lean beef, lamb, veal, pork, poultry, fish, eggs, peanut butter, nuts. Crab, lobster, oysters, and shrimp. Cooked dried beans, peas, and lentils.  Foods high in purines: Anchovies, sardines, herring, mussels, tuna, codfish, scallops, trout, and haddock. Jacob Arnold. Organ meats (such as liver or kidney). [place]. Game meat. Goose. Sweetbreads. Dairy  All dairy foods are low in purines. Low-fat and fat-free dairy products are best because they are low in saturated fat. Beverages  Drinks low in purines: Water, carbonated beverages, tea, coffee, cocoa.  Drinks moderate in purines: Soft drinks and other drinks sweetened with high-fructose corn syrup. Juices. To find whether a food or drink is sweetened with high-fructose corn syrup, look at  the ingredients list.  Drinks high in purines: Alcoholic beverages (such as beer). Condiments  Foods low in purines: Salt, herbs, olives, pickles, relishes, vinegar.  Foods moderate in purines: Butter, margarine, oils, mayonnaise. Fats and Oils  Foods low in purines: All types, except gravies and sauces made with meat.  Foods high in purines: Gravies and sauces made with meat. Other Foods  Foods low in  purines: Sugars, sweets, gelatin. Cake. Soups made without meat.  Foods moderate in purines: Meat-based or fish-based soups, broths, or bouillons. Foods and drinks sweetened with high-fructose corn syrup.  Foods high in purines: High-fat desserts (such as ice cream, cookies, cakes, pies, doughnuts, and chocolate). Contact your dietitian for more information on foods that are not listed here. This information is not intended to replace advice given to you by your health care provider. Make sure you discuss any questions you have with your health care provider. Document Released: 08/05/2010 Document Revised: 09/16/2015 Document Reviewed: 03/17/2013 Elsevier Interactive Patient Education  2017 [employer].

## 2018-05-28 ENCOUNTER — Encounter: Payer: Self-pay | Admitting: Internal Medicine

## 2018-05-28 ENCOUNTER — Ambulatory Visit (INDEPENDENT_AMBULATORY_CARE_PROVIDER_SITE_OTHER): Payer: BLUE CROSS/BLUE SHIELD | Admitting: Internal Medicine

## 2018-05-28 ENCOUNTER — Other Ambulatory Visit: Payer: Self-pay | Admitting: Internal Medicine

## 2018-05-28 VITALS — BP 110/70 | HR 69 | Temp 98.0°F | Ht 70.5 in | Wt 194.2 lb

## 2018-05-28 DIAGNOSIS — I10 Essential (primary) hypertension: Secondary | ICD-10-CM

## 2018-05-28 DIAGNOSIS — N183 Chronic kidney disease, stage 3 unspecified: Secondary | ICD-10-CM

## 2018-05-28 DIAGNOSIS — E785 Hyperlipidemia, unspecified: Secondary | ICD-10-CM

## 2018-05-28 DIAGNOSIS — F17200 Nicotine dependence, unspecified, uncomplicated: Secondary | ICD-10-CM

## 2018-05-28 DIAGNOSIS — Z0000 Encounter for general adult medical examination without abnormal findings: Secondary | ICD-10-CM

## 2018-05-28 DIAGNOSIS — R7302 Impaired glucose tolerance (oral): Secondary | ICD-10-CM | POA: Diagnosis not present

## 2018-05-28 LAB — TSH: TSH: 4.04 u[IU]/mL (ref 0.35–4.50)

## 2018-05-28 LAB — COMPREHENSIVE METABOLIC PANEL
ALT: 24 U/L (ref 0–53)
AST: 18 U/L (ref 0–37)
Albumin: 4.5 g/dL (ref 3.5–5.2)
Alkaline Phosphatase: 116 U/L (ref 39–117)
BUN: 22 mg/dL (ref 6–23)
CHLORIDE: 99 meq/L (ref 96–112)
CO2: 25 mEq/L (ref 19–32)
Calcium: 9.7 mg/dL (ref 8.4–10.5)
Creatinine, Ser: 1.62 mg/dL — ABNORMAL HIGH (ref 0.40–1.50)
GFR: 44.22 mL/min — ABNORMAL LOW (ref >60.00)
GLUCOSE: 124 mg/dL — AB (ref 70–99)
Potassium: 4 mEq/L (ref 3.5–5.1)
Sodium: 134 mEq/L — ABNORMAL LOW (ref 135–145)
Total Bilirubin: 1.1 mg/dL (ref 0.2–1.2)
Total Protein: 6.8 g/dL (ref 6.0–8.3)

## 2018-05-28 LAB — LIPID PANEL
CHOL/HDL RATIO: 6
Cholesterol: 151 mg/dL (ref 0–200)
HDL: 24.8 mg/dL — ABNORMAL LOW (ref >39.00)
NonHDL: 126.63
Triglycerides: 209 mg/dL — ABNORMAL HIGH (ref 0.0–149.0)
VLDL: 41.8 mg/dL — ABNORMAL HIGH (ref 0.0–40.0)

## 2018-05-28 LAB — CBC WITH DIFFERENTIAL/PLATELET
BASOS PCT: 0.3 % (ref 0.0–3.0)
Basophils Absolute: 0 10*3/uL (ref 0.0–0.1)
Eosinophils Absolute: 0.5 10*3/uL (ref 0.0–0.7)
Eosinophils Relative: 4.7 % (ref 0.0–5.0)
HCT: 49 % (ref 39.0–52.0)
Hemoglobin: 16.8 g/dL (ref 13.0–17.0)
Lymphocytes Relative: 28.4 % (ref 12.0–46.0)
Lymphs Abs: 3.1 10*3/uL (ref 0.7–4.0)
MCHC: 34.4 g/dL (ref 30.0–36.0)
MCV: 96.1 fl (ref 78.0–100.0)
Monocytes Absolute: 0.8 10*3/uL (ref 0.1–1.0)
Monocytes Relative: 7 % (ref 3.0–12.0)
Neutro Abs: 6.5 10*3/uL (ref 1.4–7.7)
Neutrophils Relative %: 59.6 % (ref 43.0–77.0)
Platelets: 233 10*3/uL (ref 150.0–400.0)
RBC: 5.1 Mil/uL (ref 4.22–5.81)
RDW: 14.2 % (ref 11.5–15.5)
WBC: 11 10*3/uL — ABNORMAL HIGH (ref 4.0–10.5)

## 2018-05-28 LAB — LDL CHOLESTEROL, DIRECT: LDL DIRECT: 93 mg/dL

## 2018-05-28 LAB — HEMOGLOBIN A1C: HEMOGLOBIN A1C: 6.5 % (ref 4.6–6.5)

## 2018-05-28 NOTE — Progress Notes (Signed)
Established Patient Office Visit     CC/Reason for Visit: Annual CPE  HPI: Jacob Arnold is a 58 y.o. male who is coming in today for the above mentioned reasons. Past Medical History is significant for: Hypertension, hyperlipidemia, impaired glucose tolerance, ongoing tobacco abuse.  Due to recurrent gouty attacks his diuretic therapy was discontinued and due to suboptimal blood pressure control, during his visit in September amlodipine 5 mg daily was added. At last visit norvasc was increased to 10 mg due to continued HTN. He comes in today for his CPE. No acute issues. Has been able to cut down smoking from 1 to 0.5 PPD.  He refuses flu vaccine, UTD on tetatnus.  UTD on colon cancer screening.  Gets routine dental care, have recommended routine eye care (last seen 4 years ago). He does admit to having some issues with vision.   Past Medical/Surgical History: Past Medical History:  Diagnosis Date  . ED (erectile dysfunction)   . HYPERLIPIDEMIA   . HYPERTENSION   . Panic attacks   . Tobacco abuse     Past Surgical History:  Procedure Laterality Date  . TONSILLECTOMY    . VASECTOMY      Social History:  reports that he has been smoking cigarettes. He has a 29.00 pack-year smoking history. He has never used smokeless tobacco. He reports current alcohol use of about 6.0 standard drinks of alcohol per week. He reports that he does not use drugs.  Allergies: Allergies  Allergen Reactions  . Diphenhydramine Hcl Itching and Swelling    Family History:  Family History  Problem Relation Age of Onset  . Breast cancer Mother   . Liver cancer Mother   . Diabetes Father   . Hyperlipidemia Neg Hx        family hx  . Heart disease Neg Hx        family hx  . Hypertension Neg Hx        family hx  . Cancer Neg Hx        family hx     Current Outpatient Medications:  .  allopurinol (ZYLOPRIM) 100 MG tablet, Take 2 tablets (200 mg total) by mouth daily., Disp: 180  tablet, Rfl: 6 .  amLODipine (NORVASC) 10 MG tablet, Take 1 tablet (10 mg total) by mouth daily., Disp: 30 tablet, Rfl: 2 .  aspirin 81 MG tablet, Take 1 tablet (81 mg total) by mouth daily., Disp: 30 tablet, Rfl:  .  atorvastatin (LIPITOR) 40 MG tablet, TAKE 1 TABLET BY MOUTH DAILY., Disp: 30 tablet, Rfl: 2 .  labetalol (NORMODYNE) 200 MG tablet, Take 2 tablets (400 mg total) by mouth 2 (two) times daily., Disp: 360 tablet, Rfl: 2 .  losartan (COZAAR) 100 MG tablet, Take 1 tablet (100 mg total) by mouth daily., Disp: 90 tablet, Rfl: 3 .  predniSONE (DELTASONE) 10 MG tablet, TAKE 2 TABLETS (20 MG TOTAL) BY MOUTH 2 (TWO) TIMES DAILY WITH A MEAL when needed for gout attack.  Do not take for more than 5 days., Disp: 60 tablet, Rfl: 0  Review of Systems:  Constitutional: Denies fever, chills, diaphoresis, appetite change and fatigue.  HEENT: Denies photophobia, eye pain, redness, hearing loss, ear pain, congestion, sore throat, rhinorrhea, sneezing, mouth sores, trouble swallowing, neck pain, neck stiffness and tinnitus.   Respiratory: Denies SOB, DOE, cough, chest tightness,  and wheezing.   Cardiovascular: Denies chest pain, palpitations and leg swelling.  Gastrointestinal: Denies nausea, vomiting, abdominal pain,  diarrhea, constipation, blood in stool and abdominal distention.  Genitourinary: Denies dysuria, urgency, frequency, hematuria, flank pain and difficulty urinating.  Endocrine: Denies: hot or cold intolerance, sweats, changes in hair or nails, polyuria, polydipsia. Musculoskeletal: Denies myalgias, back pain, joint swelling, arthralgias and gait problem.  Skin: Denies pallor, rash and wound.  Neurological: Denies dizziness, seizures, syncope, weakness, light-headedness, numbness and headaches.  Hematological: Denies adenopathy. Easy bruising, personal or family bleeding history  Psychiatric/Behavioral: Denies suicidal ideation, mood changes, confusion, nervousness, sleep disturbance and  agitation    Physical Exam: Vitals:   05/28/18 0828  BP: 110/70  Pulse: 69  Temp: 98 F (36.7 C)  TempSrc: Oral  SpO2: 95%  Weight: 194 lb 3.2 oz (88.1 kg)  Height: 5' 10.5" (1.791 m)    Body mass index is 27.47 kg/m.   Constitutional: NAD, calm, comfortable Eyes: PERRL, lids and conjunctivae normal ENMT: Mucous membranes are moist. Posterior pharynx clear of any exudate or lesions. Normal dentition. Tympanic membrane is pearly white, no erythema or bulging. Neck: normal, supple, no masses, no thyromegaly Respiratory: clear to auscultation bilaterally, no wheezing, no crackles. Normal respiratory effort. No accessory muscle use.  Cardiovascular: Regular rate and rhythm, no murmurs / rubs / gallops. No extremity edema. 2+ pedal pulses. No carotid bruits.  Abdomen: no tenderness, no masses palpated. No hepatosplenomegaly. Bowel sounds positive.  Musculoskeletal: no clubbing / cyanosis. No joint deformity upper and lower extremities. Good ROM, no contractures. Normal muscle tone.  Skin: no rashes, lesions, ulcers. No induration Neurologic: CN 2-12 grossly intact. Sensation intact, DTR normal. Strength 5/5 in all 4.  Psychiatric: Normal judgment and insight. Alert and oriented x 3. Normal mood.    Impression and Plan:  Impaired glucose tolerance  -Discussed healthy lifestyle modifications. -A1C and FBS today. -Wouldn't be surprised if he tips over into the diabetic range.  Tobacco use disorder -I have discussed tobacco cessation with the patient.  I have counseled the patient regarding the negative impacts of continued tobacco use including but not limited to lung cancer, COPD, and cardiovascular disease.  I have discussed alternatives to tobacco and modalities that may help facilitate tobacco cessation including but not limited to biofeedback, hypnosis, and medications.  Total time spent with tobacco counseling was 4 minutes. -Have congratulated him on his efforts on reducing  smoking and encouraged to continue to taper down his smoking habits.  Dyslipidemia  -Last LDL was 95 in Jan 2019. -On atorvastatin 40 mg. -Check lipids today.  Essential hypertension -Well controlled on increased norvasc to 10 mg. -Also on losartan and labetalol.  Stage 3 chronic kidney disease (HCC) -Baseline Cr appears to be between 1.3-1.7. -Recheck today.  Preventive Health -UTD on age-appropriate cancer screening. -UTD on tetanus, refuses flu. -Advised routine eye and dental care.   Patient Instructions  Doristine Devoid seeing you today.  Instructions: -decrease carbs, salt and fasts in your daily diet; see DASH diet below -monitor your blood pressure 2-3x a week, write down and bring to next visit -lab work today. Will notify you when results are available. -Continue your smoking cessation efforts.  DASH Eating Plan DASH stands for "Dietary Approaches to Stop Hypertension." The DASH eating plan is a healthy eating plan that has been shown to reduce high blood pressure (hypertension). It may also reduce your risk for type 2 diabetes, heart disease, and stroke. The DASH eating plan may also help with weight loss. What are tips for following this plan?  General guidelines  Avoid eating more than 2,300  mg (milligrams) of salt (sodium) a day. If you have hypertension, you may need to reduce your sodium intake to 1,500 mg a day.  Limit alcohol intake to no more than 1 drink a day for nonpregnant women and 2 drinks a day for men. One drink equals 12 oz of beer, 5 oz of wine, or 1 oz of hard liquor.  Work with your health care provider to maintain a healthy body weight or to lose weight. Ask what an ideal weight is for you.  Get at least 30 minutes of exercise that causes your heart to beat faster (aerobic exercise) most days of the week. Activities may include walking, swimming, or biking.  Work with your health care provider or diet and nutrition specialist (dietitian) to adjust  your eating plan to your individual calorie needs. Reading food labels   Check food labels for the amount of sodium per serving. Choose foods with less than 5 percent of the Daily Value of sodium. Generally, foods with less than 300 mg of sodium per serving fit into this eating plan.  To find whole grains, look for the word "whole" as the first word in the ingredient list. Shopping  Buy products labeled as "low-sodium" or "no salt added."  Buy fresh foods. Avoid canned foods and premade or frozen meals. Cooking  Avoid adding salt when cooking. Use salt-free seasonings or herbs instead of table salt or sea salt. Check with your health care provider or pharmacist before using salt substitutes.  Do not fry foods. Cook foods using healthy methods such as baking, boiling, grilling, and broiling instead.  Cook with heart-healthy oils, such as olive, canola, soybean, or sunflower oil. Meal planning  Eat a balanced diet that includes: ? 5 or more servings of fruits and vegetables each day. At each meal, try to fill half of your plate with fruits and vegetables. ? Up to 6-8 servings of whole grains each day. ? Less than 6 oz of lean meat, poultry, or fish each day. A 3-oz serving of meat is about the same size as a deck of cards. One egg equals 1 oz. ? 2 servings of low-fat dairy each day. ? A serving of nuts, seeds, or beans 5 times each week. ? Heart-healthy fats. Healthy fats called Omega-3 fatty acids are found in foods such as flaxseeds and coldwater fish, like sardines, salmon, and mackerel.  Limit how much you eat of the following: ? Canned or prepackaged foods. ? Food that is high in trans fat, such as fried foods. ? Food that is high in saturated fat, such as fatty meat. ? Sweets, desserts, sugary drinks, and other foods with added sugar. ? Full-fat dairy products.  Do not salt foods before eating.  Try to eat at least 2 vegetarian meals each week.  Eat more home-cooked food  and less restaurant, buffet, and fast food.  When eating at a restaurant, ask that your food be prepared with less salt or no salt, if possible. What foods are recommended? The items listed may not be a complete list. Talk with your dietitian about what dietary choices are best for you. Grains Whole-grain or whole-wheat bread. Whole-grain or whole-wheat pasta. Brown rice. [name]. Bulgur. Whole-grain and low-sodium cereals. Pita bread. Low-fat, low-sodium crackers. Whole-wheat flour tortillas. Vegetables Fresh or frozen vegetables (raw, steamed, roasted, or grilled). Low-sodium or reduced-sodium tomato and vegetable juice. Low-sodium or reduced-sodium tomato sauce and tomato paste. Low-sodium or reduced-sodium canned vegetables. Fruits All fresh, dried, or frozen  fruit. Canned fruit in natural juice (without added sugar). Meat and other protein foods Skinless chicken or [place]. Ground chicken or [place]. Pork with fat trimmed off. Fish and seafood. Egg whites. Dried beans, peas, or lentils. Unsalted nuts, nut butters, and seeds. Unsalted canned beans. Lean cuts of beef with fat trimmed off. Low-sodium, lean deli meat. Dairy Low-fat (1%) or fat-free (skim) milk. Fat-free, low-fat, or reduced-fat cheeses. Nonfat, low-sodium ricotta or cottage cheese. Low-fat or nonfat yogurt. Low-fat, low-sodium cheese. Fats and oils Soft margarine without trans fats. Vegetable oil. Low-fat, reduced-fat, or light mayonnaise and salad dressings (reduced-sodium). Canola, safflower, olive, soybean, and sunflower oils. Avocado. Seasoning and other foods Herbs. Spices. Seasoning mixes without salt. Unsalted popcorn and pretzels. Fat-free sweets. What foods are not recommended? The items listed may not be a complete list. Talk with your dietitian about what dietary choices are best for you. Grains Baked goods made with fat, such as croissants, muffins, or some breads. Dry pasta or rice meal  packs. Vegetables Creamed or fried vegetables. Vegetables in a cheese sauce. Regular canned vegetables (not low-sodium or reduced-sodium). Regular canned tomato sauce and paste (not low-sodium or reduced-sodium). Regular tomato and vegetable juice (not low-sodium or reduced-sodium). [name]. Olives. Fruits Canned fruit in a light or heavy syrup. Fried fruit. Fruit in cream or butter sauce. Meat and other protein foods Fatty cuts of meat. Ribs. Fried meat. [name]. Sausage. Bologna and other processed lunch meats. Salami. Fatback. Hotdogs. Bratwurst. Salted nuts and seeds. Canned beans with added salt. Canned or smoked fish. Whole eggs or egg yolks. Chicken or [place] with skin. Dairy Whole or 2% milk, cream, and half-and-half. Whole or full-fat cream cheese. Whole-fat or sweetened yogurt. Full-fat cheese. Nondairy creamers. Whipped toppings. Processed cheese and cheese spreads. Fats and oils Butter. Stick margarine. Lard. Shortening. Ghee. Bacon fat. Tropical oils, such as coconut, palm kernel, or palm oil. Seasoning and other foods Salted popcorn and pretzels. Onion salt, garlic salt, seasoned salt, table salt, and sea salt. Worcestershire sauce. Tartar sauce. Barbecue sauce. Teriyaki sauce. Soy sauce, including reduced-sodium. Steak sauce. Canned and packaged gravies. Fish sauce. Oyster sauce. Cocktail sauce. Horseradish that you find on the shelf. Ketchup. Mustard. Meat flavorings and tenderizers. Bouillon cubes. Hot sauce and Tabasco sauce. Premade or packaged marinades. Premade or packaged taco seasonings. Relishes. Regular salad dressings. Where to find more information:  National Heart, Lung, and [place]: https://wilson-eaton.com/  American Heart Association: www.heart.org Summary  The DASH eating plan is a healthy eating plan that has been shown to reduce high blood pressure (hypertension). It may also reduce your risk for type 2 diabetes, heart disease, and stroke.  With the DASH eating  plan, you should limit salt (sodium) intake to 2,300 mg a day. If you have hypertension, you may need to reduce your sodium intake to 1,500 mg a day.  When on the DASH eating plan, aim to eat more fresh fruits and vegetables, whole grains, lean proteins, low-fat dairy, and heart-healthy fats.  Work with your health care provider or diet and nutrition specialist (dietitian) to adjust your eating plan to your individual calorie needs. This information is not intended to replace advice given to you by your health care provider. Make sure you discuss any questions you have with your health care provider. Document Released: 03/30/2011 Document Revised: 04/03/2016 Document Reviewed: 04/03/2016   Smoking Tobacco Information, Adult Smoking tobacco can be harmful to your health. Tobacco contains a poisonous (toxic), colorless chemical called nicotine. Nicotine is addictive. It changes  the brain and can make it hard to stop smoking. Tobacco also has other toxic chemicals that can hurt your body and raise your risk of many cancers. How can smoking tobacco affect me? Smoking tobacco puts you at risk for:  Cancer. Smoking is most commonly associated with lung cancer, but can also lead to cancer in other parts of the body.  Chronic obstructive pulmonary disease (COPD). This is a long-term lung condition that makes it hard to breathe. It also gets worse over time.  High blood pressure (hypertension), heart disease, stroke, or heart attack.  Lung infections, such as pneumonia.  Cataracts. This is when the lenses in the eyes become clouded.  Digestive problems. This may include peptic ulcers, heartburn, and gastroesophageal reflux disease (GERD).  Oral health problems, such as gum disease and tooth loss.  Loss of taste and smell. Smoking can affect your appearance by causing:  Wrinkles.  Yellow or stained teeth, fingers, and fingernails. Smoking tobacco can also affect your social life,  because:  It may be challenging to find places to smoke when away from home. Many workplaces, [employer], hotels, and public places are tobacco-free.  Smoking is expensive. This is due to the cost of tobacco and the long-term costs of treating health problems from smoking.  Secondhand smoke may affect those around you. Secondhand smoke can cause lung cancer, breathing problems, and heart disease. Children of smokers have a higher risk for: ? Sudden infant death syndrome (SIDS). ? Ear infections. ? Lung infections. If you currently smoke tobacco, quitting now can help you:  Lead a longer and healthier life.  Look, smell, breathe, and feel better over time.  Save money.  Protect others from the harms of secondhand smoke. What actions can I take to prevent health problems? Quit smoking   Do not start smoking. Quit if you already do.  Make a plan to quit smoking and commit to it. Look for programs to help you and ask your health care provider for recommendations and ideas.  Set a date and write down all the reasons you want to quit.  Let your friends and family know you are quitting so they can help and support you. Consider finding friends who also want to quit. It can be easier to quit with someone else, so that you can support each other.  Talk with your health care provider about using nicotine replacement medicines to help you quit, such as gum, lozenges, patches, sprays, or pills.  Do not replace cigarette smoking with electronic cigarettes, which are commonly called e-cigarettes. The safety of e-cigarettes is not known, and some may contain harmful chemicals.  If you try to quit but return to smoking, stay positive. It is common to slip up when you first quit, so take it one day at a time.  Be prepared for cravings. When you feel the urge to smoke, chew gum or suck on hard candy. Lifestyle  Stay busy and take care of your body.  Drink enough fluid to keep your urine pale  yellow.  Get plenty of exercise and eat a healthy diet. This can help prevent weight gain after quitting.  Monitor your eating habits. Quitting smoking can cause you to have a larger appetite than when you smoke.  Find ways to relax. Go out with friends or family to a movie or a restaurant where people do not smoke.  Ask your health care provider about having regular tests (screenings) to check for cancer. This may include blood tests,  imaging tests, and other tests.  Find ways to manage your stress, such as meditation, yoga, or exercise. Where to find support To get support to quit smoking, consider:  Asking your health care provider for more information and resources.  Taking classes to learn more about quitting smoking.  Looking for local organizations that offer resources about quitting smoking.  Joining a support group for people who want to quit smoking in your local community.  Calling the smokefree.gov counselor helpline: 1-800-Quit-Now 574-684-3786) Where to find more information You may find more information about quitting smoking from:  HelpGuide.org: www.helpguide.org  https://hall.com/: smokefree.gov  American Lung Association: www.lung.org Contact a health care provider if you:  Have problems breathing.  Notice that your lips, nose, or fingers turn blue.  Have chest pain.  Are coughing up blood.  Feel faint or you pass out.  Have other health changes that cause you to worry. Summary  Smoking tobacco can negatively affect your health, the health of those around you, your finances, and your social life.  Do not start smoking. Quit if you already do. If you need help quitting, ask your health care provider.  Think about joining a support group for people who want to quit smoking in your local community. There are many effective programs that will help you to quit this behavior. This information is not intended to replace advice given to you by your health  care provider. Make sure you discuss any questions you have with your health care provider. Document Released: 04/25/2016 Document Revised: 05/30/2017 Document Reviewed: 04/25/2016 Elsevier Interactive Patient Education  2019 [employer].  Chartered certified accountant Patient Education  2019 [place], [place] [facility]

## 2018-05-28 NOTE — Patient Instructions (Addendum)
Great seeing you today.  Instructions: -decrease carbs, salt and fasts in your daily diet; see DASH diet below -monitor your blood pressure 2-3x a week, write down and bring to next visit -lab work today. Will notify you when results are available. -Continue your smoking cessation efforts.  DASH Eating Plan DASH stands for "Dietary Approaches to Stop Hypertension." The DASH eating plan is a healthy eating plan that has been shown to reduce high blood pressure (hypertension). It may also reduce your risk for type 2 diabetes, heart disease, and stroke. The DASH eating plan may also help with weight loss. What are tips for following this plan?  General guidelines  Avoid eating more than 2,300 mg (milligrams) of salt (sodium) a day. If you have hypertension, you may need to reduce your sodium intake to 1,500 mg a day.  Limit alcohol intake to no more than 1 drink a day for nonpregnant women and 2 drinks a day for men. One drink equals 12 oz of beer, 5 oz of wine, or 1 oz of hard liquor.  Work with your health care provider to maintain a healthy body weight or to lose weight. Ask what an ideal weight is for you.  Get at least 30 minutes of exercise that causes your heart to beat faster (aerobic exercise) most days of the week. Activities may include walking, swimming, or biking.  Work with your health care provider or diet and nutrition specialist (dietitian) to adjust your eating plan to your individual calorie needs. Reading food labels   Check food labels for the amount of sodium per serving. Choose foods with less than 5 percent of the Daily Value of sodium. Generally, foods with less than 300 mg of sodium per serving fit into this eating plan.  To find whole grains, look for the word "whole" as the first word in the ingredient list. Shopping  Buy products labeled as "low-sodium" or "no salt added."  Buy fresh foods. Avoid canned foods and premade or frozen meals. Cooking  Avoid  adding salt when cooking. Use salt-free seasonings or herbs instead of table salt or sea salt. Check with your health care provider or pharmacist before using salt substitutes.  Do not fry foods. Cook foods using healthy methods such as baking, boiling, grilling, and broiling instead.  Cook with heart-healthy oils, such as olive, canola, soybean, or sunflower oil. Meal planning  Eat a balanced diet that includes: ? 5 or more servings of fruits and vegetables each day. At each meal, try to fill half of your plate with fruits and vegetables. ? Up to 6-8 servings of whole grains each day. ? Less than 6 oz of lean meat, poultry, or fish each day. A 3-oz serving of meat is about the same size as a deck of cards. One egg equals 1 oz. ? 2 servings of low-fat dairy each day. ? A serving of nuts, seeds, or beans 5 times each week. ? Heart-healthy fats. Healthy fats called Omega-3 fatty acids are found in foods such as flaxseeds and coldwater fish, like sardines, salmon, and mackerel.  Limit how much you eat of the following: ? Canned or prepackaged foods. ? Food that is high in trans fat, such as fried foods. ? Food that is high in saturated fat, such as fatty meat. ? Sweets, desserts, sugary drinks, and other foods with added sugar. ? Full-fat dairy products.  Do not salt foods before eating.  Try to eat at least 2 vegetarian meals each week.  Eat more home-cooked food and less restaurant, buffet, and fast food.  When eating at a restaurant, ask that your food be prepared with less salt or no salt, if possible. What foods are recommended? The items listed may not be a complete list. Talk with your dietitian about what dietary choices are best for you. Grains Whole-grain or whole-wheat bread. Whole-grain or whole-wheat pasta. Brown rice. Jacob Arnold. Bulgur. Whole-grain and low-sodium cereals. Pita bread. Low-fat, low-sodium crackers. Whole-wheat flour tortillas. Vegetables Fresh or  frozen vegetables (raw, steamed, roasted, or grilled). Low-sodium or reduced-sodium tomato and vegetable juice. Low-sodium or reduced-sodium tomato sauce and tomato paste. Low-sodium or reduced-sodium canned vegetables. Fruits All fresh, dried, or frozen fruit. Canned fruit in natural juice (without added sugar). Meat and other protein foods Skinless chicken or [place]. Ground chicken or [place]. Pork with fat trimmed off. Fish and seafood. Egg whites. Dried beans, peas, or lentils. Unsalted nuts, nut butters, and seeds. Unsalted canned beans. Lean cuts of beef with fat trimmed off. Low-sodium, lean deli meat. Dairy Low-fat (1%) or fat-free (skim) milk. Fat-free, low-fat, or reduced-fat cheeses. Nonfat, low-sodium ricotta or cottage cheese. Low-fat or nonfat yogurt. Low-fat, low-sodium cheese. Fats and oils Soft margarine without trans fats. Vegetable oil. Low-fat, reduced-fat, or light mayonnaise and salad dressings (reduced-sodium). Canola, safflower, olive, soybean, and sunflower oils. Avocado. Seasoning and other foods Herbs. Spices. Seasoning mixes without salt. Unsalted popcorn and pretzels. Fat-free sweets. What foods are not recommended? The items listed may not be a complete list. Talk with your dietitian about what dietary choices are best for you. Grains Baked goods made with fat, such as croissants, muffins, or some breads. Dry pasta or rice meal packs. Vegetables Creamed or fried vegetables. Vegetables in a cheese sauce. Regular canned vegetables (not low-sodium or reduced-sodium). Regular canned tomato sauce and paste (not low-sodium or reduced-sodium). Regular tomato and vegetable juice (not low-sodium or reduced-sodium). Jacob Arnold. Olives. Fruits Canned fruit in a light or heavy syrup. Fried fruit. Fruit in cream or butter sauce. Meat and other protein foods Fatty cuts of meat. Ribs. Fried meat. Jacob Arnold. Sausage. Bologna and other processed lunch meats. Salami. Fatback. Hotdogs.  Bratwurst. Salted nuts and seeds. Canned beans with added salt. Canned or smoked fish. Whole eggs or egg yolks. Chicken or [place] with skin. Dairy Whole or 2% milk, cream, and half-and-half. Whole or full-fat cream cheese. Whole-fat or sweetened yogurt. Full-fat cheese. Nondairy creamers. Whipped toppings. Processed cheese and cheese spreads. Fats and oils Butter. Stick margarine. Lard. Shortening. Ghee. Bacon fat. Tropical oils, such as coconut, palm kernel, or palm oil. Seasoning and other foods Salted popcorn and pretzels. Onion salt, garlic salt, seasoned salt, table salt, and sea salt. Worcestershire sauce. Tartar sauce. Barbecue sauce. Teriyaki sauce. Soy sauce, including reduced-sodium. Steak sauce. Canned and packaged gravies. Fish sauce. Oyster sauce. Cocktail sauce. Horseradish that you find on the shelf. Ketchup. Mustard. Meat flavorings and tenderizers. Bouillon cubes. Hot sauce and Tabasco sauce. Premade or packaged marinades. Premade or packaged taco seasonings. Relishes. Regular salad dressings. Where to find more information:  National Heart, Lung, and [place]: https://wilson-eaton.com/  American Heart Association: www.heart.org Summary  The DASH eating plan is a healthy eating plan that has been shown to reduce high blood pressure (hypertension). It may also reduce your risk for type 2 diabetes, heart disease, and stroke.  With the DASH eating plan, you should limit salt (sodium) intake to 2,300 mg a day. If you have hypertension, you may need to reduce your sodium intake  to 1,500 mg a day.  When on the DASH eating plan, aim to eat more fresh fruits and vegetables, whole grains, lean proteins, low-fat dairy, and heart-healthy fats.  Work with your health care provider or diet and nutrition specialist (dietitian) to adjust your eating plan to your individual calorie needs. This information is not intended to replace advice given to you by your health care provider. Make sure you  discuss any questions you have with your health care provider. Document Released: 03/30/2011 Document Revised: 04/03/2016 Document Reviewed: 04/03/2016   Smoking Tobacco Information, Adult Smoking tobacco can be harmful to your health. Tobacco contains a poisonous (toxic), colorless chemical called nicotine. Nicotine is addictive. It changes the brain and can make it hard to stop smoking. Tobacco also has other toxic chemicals that can hurt your body and raise your risk of many cancers. How can smoking tobacco affect me? Smoking tobacco puts you at risk for:  Cancer. Smoking is most commonly associated with lung cancer, but can also lead to cancer in other parts of the body.  Chronic obstructive pulmonary disease (COPD). This is a long-term lung condition that makes it hard to breathe. It also gets worse over time.  High blood pressure (hypertension), heart disease, stroke, or heart attack.  Lung infections, such as pneumonia.  Cataracts. This is when the lenses in the eyes become clouded.  Digestive problems. This may include peptic ulcers, heartburn, and gastroesophageal reflux disease (GERD).  Oral health problems, such as gum disease and tooth loss.  Loss of taste and smell. Smoking can affect your appearance by causing:  Wrinkles.  Yellow or stained teeth, fingers, and fingernails. Smoking tobacco can also affect your social life, because:  It may be challenging to find places to smoke when away from home. Many workplaces, [employer], hotels, and public places are tobacco-free.  Smoking is expensive. This is due to the cost of tobacco and the long-term costs of treating health problems from smoking.  Secondhand smoke may affect those around you. Secondhand smoke can cause lung cancer, breathing problems, and heart disease. Children of smokers have a higher risk for: ? Sudden infant death syndrome (SIDS). ? Ear infections. ? Lung infections. If you currently smoke tobacco,  quitting now can help you:  Lead a longer and healthier life.  Look, smell, breathe, and feel better over time.  Save money.  Protect others from the harms of secondhand smoke. What actions can I take to prevent health problems? Quit smoking   Do not start smoking. Quit if you already do.  Make a plan to quit smoking and commit to it. Look for programs to help you and ask your health care provider for recommendations and ideas.  Set a date and write down all the reasons you want to quit.  Let your friends and family know you are quitting so they can help and support you. Consider finding friends who also want to quit. It can be easier to quit with someone else, so that you can support each other.  Talk with your health care provider about using nicotine replacement medicines to help you quit, such as gum, lozenges, patches, sprays, or pills.  Do not replace cigarette smoking with electronic cigarettes, which are commonly called e-cigarettes. The safety of e-cigarettes is not known, and some may contain harmful chemicals.  If you try to quit but return to smoking, stay positive. It is common to slip up when you first quit, so take it one day at a time.  Be prepared for cravings. When you feel the urge to smoke, chew gum or suck on hard candy. Lifestyle  Stay busy and take care of your body.  Drink enough fluid to keep your urine pale yellow.  Get plenty of exercise and eat a healthy diet. This can help prevent weight gain after quitting.  Monitor your eating habits. Quitting smoking can cause you to have a larger appetite than when you smoke.  Find ways to relax. Go out with friends or family to a movie or a restaurant where people do not smoke.  Ask your health care provider about having regular tests (screenings) to check for cancer. This may include blood tests, imaging tests, and other tests.  Find ways to manage your stress, such as meditation, yoga, or exercise. Where  to find support To get support to quit smoking, consider:  Asking your health care provider for more information and resources.  Taking classes to learn more about quitting smoking.  Looking for local organizations that offer resources about quitting smoking.  Joining a support group for people who want to quit smoking in your local community.  Calling the smokefree.gov counselor helpline: 1-800-Quit-Now (640)402-0346) Where to find more information You may find more information about quitting smoking from:  HelpGuide.org: www.helpguide.org  https://hall.com/: smokefree.gov  American Lung Association: www.lung.org Contact a health care provider if you:  Have problems breathing.  Notice that your lips, nose, or fingers turn blue.  Have chest pain.  Are coughing up blood.  Feel faint or you pass out.  Have other health changes that cause you to worry. Summary  Smoking tobacco can negatively affect your health, the health of those around you, your finances, and your social life.  Do not start smoking. Quit if you already do. If you need help quitting, ask your health care provider.  Think about joining a support group for people who want to quit smoking in your local community. There are many effective programs that will help you to quit this behavior. This information is not intended to replace advice given to you by your health care provider. Make sure you discuss any questions you have with your health care provider. Document Released: 04/25/2016 Document Revised: 05/30/2017 Document Reviewed: 04/25/2016 Elsevier Interactive Patient Education  2019 [employer].  Chartered certified accountant Patient Education  [employer].

## 2018-07-18 ENCOUNTER — Telehealth: Payer: Self-pay | Admitting: Internal Medicine

## 2018-07-18 NOTE — Telephone Encounter (Unsigned)
Copied from [place] [id number]. Topic: General - Other >> Jul 18, 2018  2:24 PM Celene Kras A wrote: Reason for CRM: Pt left vm requesting refill on amLODipine (NORVASC) 10 MG tablet [[id number]]. Pt states he ran out of this medication 1wk ago and is concerned about his bp. He is also requesting a refill on atorvastatin (LIPITOR) 40 MG tablet [[id number]]. Please advise.

## 2018-07-19 ENCOUNTER — Other Ambulatory Visit: Payer: Self-pay | Admitting: Internal Medicine

## 2018-07-19 DIAGNOSIS — I10 Essential (primary) hypertension: Secondary | ICD-10-CM

## 2018-07-29 ENCOUNTER — Other Ambulatory Visit: Payer: Self-pay | Admitting: *Deleted

## 2018-07-29 DIAGNOSIS — I10 Essential (primary) hypertension: Secondary | ICD-10-CM

## 2018-07-29 MED ORDER — LABETALOL HCL 200 MG PO TABS: 400.0000 mg | ORAL_TABLET | Freq: Two times a day (BID) | ORAL | 2 refills | Status: DC

## 2018-08-27 ENCOUNTER — Ambulatory Visit (INDEPENDENT_AMBULATORY_CARE_PROVIDER_SITE_OTHER): Payer: BLUE CROSS/BLUE SHIELD | Admitting: Internal Medicine

## 2018-08-27 ENCOUNTER — Encounter: Payer: Self-pay | Admitting: Internal Medicine

## 2018-08-27 ENCOUNTER — Other Ambulatory Visit: Payer: Self-pay

## 2018-08-27 VITALS — BP 130/80 | HR 73 | Temp 98.0°F | Wt 197.2 lb

## 2018-08-27 DIAGNOSIS — N183 Chronic kidney disease, stage 3 unspecified: Secondary | ICD-10-CM

## 2018-08-27 DIAGNOSIS — M109 Gout, unspecified: Secondary | ICD-10-CM

## 2018-08-27 DIAGNOSIS — E785 Hyperlipidemia, unspecified: Secondary | ICD-10-CM | POA: Diagnosis not present

## 2018-08-27 DIAGNOSIS — R7302 Impaired glucose tolerance (oral): Secondary | ICD-10-CM | POA: Diagnosis not present

## 2018-08-27 DIAGNOSIS — I10 Essential (primary) hypertension: Secondary | ICD-10-CM | POA: Diagnosis not present

## 2018-08-27 MED ORDER — ATORVASTATIN CALCIUM 40 MG PO TABS: 40.0000 mg | ORAL_TABLET | Freq: Every day | ORAL | 1 refills | Status: DC

## 2018-08-27 MED ORDER — ALLOPURINOL 100 MG PO TABS: 200.0000 mg | ORAL_TABLET | Freq: Every day | ORAL | 1 refills | Status: DC

## 2018-08-27 MED ORDER — LABETALOL HCL 200 MG PO TABS: 400.0000 mg | ORAL_TABLET | Freq: Two times a day (BID) | ORAL | 1 refills | Status: DC

## 2018-08-27 MED ORDER — AMLODIPINE BESYLATE 10 MG PO TABS: 10.0000 mg | ORAL_TABLET | Freq: Every day | ORAL | 1 refills | Status: DC

## 2018-08-27 NOTE — Patient Instructions (Signed)
-  Nice seeing you today!  -Schedule follow up in 4 months.  -Continue to work on smoking cessation.  -Make sure you follow up with the kidney doctors.

## 2018-08-27 NOTE — Progress Notes (Signed)
Established Patient Office Visit     CC/Reason for Visit: Follow-up blood pressure, cholesterol, impaired glucose tolerance  HPI: Jacob Arnold is a 58 y.o. male who is coming in today for the above mentioned reasons. Past Medical History is significant for: Hypertension, hyperlipidemia, impaired glucose tolerance, ongoing tobacco abuse. He has no acute complaints today.  Referral to nephrology was placed after his physical in February due to a creatinine of 1.6, losartan was discontinued.  He has not yet followed up with them.  He is down to half to three quarters a pack a day of cigarettes.  He is currently trying lozenges, he has done Wellbutrin and hypnosis in the past without much success.  Despite discontinuing losartan, blood pressure remains well controlled.  He needs medication refills.   Past Medical/Surgical History: Past Medical History:  Diagnosis Date  . ED (erectile dysfunction)   . HYPERLIPIDEMIA   . HYPERTENSION   . Panic attacks   . Tobacco abuse     Past Surgical History:  Procedure Laterality Date  . TONSILLECTOMY    . VASECTOMY      Social History:  reports that he has been smoking cigarettes. He has a 29.00 pack-year smoking history. He has never used smokeless tobacco. He reports current alcohol use of about 6.0 standard drinks of alcohol per week. He reports that he does not use drugs.  Allergies: Allergies  Allergen Reactions  . Diphenhydramine Hcl Itching and Swelling    Family History:  Family History  Problem Relation Age of Onset  . Breast cancer Mother   . Liver cancer Mother   . Diabetes Father   . Hyperlipidemia Neg Hx        family hx  . Heart disease Neg Hx        family hx  . Hypertension Neg Hx        family hx  . Cancer Neg Hx        family hx     Current Outpatient Medications:  .  allopurinol (ZYLOPRIM) 100 MG tablet, Take 2 tablets (200 mg total) by mouth daily., Disp: 180 tablet, Rfl: 1 .  amLODipine (NORVASC)  10 MG tablet, Take 1 tablet (10 mg total) by mouth daily., Disp: 90 tablet, Rfl: 1 .  aspirin 81 MG tablet, Take 1 tablet (81 mg total) by mouth daily., Disp: 30 tablet, Rfl:  .  atorvastatin (LIPITOR) 40 MG tablet, Take 1 tablet (40 mg total) by mouth daily., Disp: 90 tablet, Rfl: 1 .  labetalol (NORMODYNE) 200 MG tablet, Take 2 tablets (400 mg total) by mouth 2 (two) times daily., Disp: 360 tablet, Rfl: 1 .  predniSONE (DELTASONE) 10 MG tablet, TAKE 2 TABLETS (20 MG TOTAL) BY MOUTH 2 (TWO) TIMES DAILY WITH A MEAL when needed for gout attack.  Do not take for more than 5 days., Disp: 60 tablet, Rfl: 0  Review of Systems:  Constitutional: Denies fever, chills, diaphoresis, appetite change and fatigue.  HEENT: Denies photophobia, eye pain, redness, hearing loss, ear pain, congestion, sore throat, rhinorrhea, sneezing, mouth sores, trouble swallowing, neck pain, neck stiffness and tinnitus.   Respiratory: Denies SOB, DOE, cough, chest tightness,  and wheezing.   Cardiovascular: Denies chest pain, palpitations and leg swelling.  Gastrointestinal: Denies nausea, vomiting, abdominal pain, diarrhea, constipation, blood in stool and abdominal distention.  Genitourinary: Denies dysuria, urgency, frequency, hematuria, flank pain and difficulty urinating.  Endocrine: Denies: hot or cold intolerance, sweats, changes in hair or nails,  polyuria, polydipsia. Musculoskeletal: Denies myalgias, back pain, joint swelling, arthralgias and gait problem.  Skin: Denies pallor, rash and wound.  Neurological: Denies dizziness, seizures, syncope, weakness, light-headedness, numbness and headaches.  Hematological: Denies adenopathy. Easy bruising, personal or family bleeding history  Psychiatric/Behavioral: Denies suicidal ideation, mood changes, confusion, nervousness, sleep disturbance and agitation    Physical Exam: Vitals:   08/27/18 0825  BP: 130/80  Pulse: 73  Temp: 98 F (36.7 C)  TempSrc: Oral  SpO2: 96%   Weight: 197 lb 3.2 oz (89.4 kg)    Body mass index is 27.9 kg/m.   Constitutional: NAD, calm, comfortable Eyes: PERRL, lids and conjunctivae normal ENMT: Mucous membranes are moist. Neck: normal, supple, no masses, no thyromegaly Respiratory: clear to auscultation bilaterally, no wheezing, no crackles. Normal respiratory effort. No accessory muscle use.  Cardiovascular: Regular rate and rhythm, no murmurs / rubs / gallops. No extremity edema. 2+ pedal pulses. No carotid bruits.   Musculoskeletal: no clubbing / cyanosis. No joint deformity upper and lower extremities. Good ROM, no contractures. Normal muscle tone.  Skin: no rashes, lesions, ulcers. No induration Neurologic: Grossly intact and nonfocal  psychiatric: Normal judgment and insight. Alert and oriented x 3. Normal mood.    Impression and Plan:  Dyslipidemia -Refill atorvastatin 40 mg today. -Last LDL was 93 in February 2020. -If he ever tips into the diabetic range, will need to increase statin dose for goal LDL less than 70.  Essential hypertension  -Blood pressure is well controlled despite discontinuation of losartan. -He currently remains on Norvasc 10 and labetalol 400 mg twice daily. -We will provide refills today.  Impaired glucose tolerance -His A1c continues to hover around the prediabetic/diabetic range at 6.5. -We have discussed healthy lifestyle, increase physical activity. -Recheck A1c at next visit in 4 months.  Stage 3 chronic kidney disease ([place]) -Have stressed importance of follow-up with nephrology, off ARB/ACE inhibitor for now.  Gouty arthritis  -Refilled allopurinol, no recent flareups.    Patient Instructions  -Nice seeing you today!  -Schedule follow up in 4 months.  -Continue to work on smoking cessation.  -Make sure you follow up with the kidney doctors.     Lelon Frohlich, MD [facility]

## 2018-11-15 ENCOUNTER — Telehealth: Payer: Self-pay | Admitting: Internal Medicine

## 2018-11-15 NOTE — Telephone Encounter (Signed)
Patient has Bursitis on his elbow and is wanting to come in and have it drained. I told the patient that I don't think that Dr. [name] does any draining. That she will probably refer him to sports medicine or orthopedics. I tried scheduling a virtual but the patient is insisting an in office.  Please Advise

## 2018-11-19 NOTE — Telephone Encounter (Addendum)
Patient advised to go to sports med or ortho.  Patient will call his insurance and call back for a referral.  Okay with Dr [name].

## 2019-01-02 ENCOUNTER — Ambulatory Visit: Payer: BLUE CROSS/BLUE SHIELD | Admitting: Internal Medicine

## 2019-01-09 ENCOUNTER — Ambulatory Visit: Payer: BLUE CROSS/BLUE SHIELD | Admitting: Internal Medicine

## 2019-07-10 ENCOUNTER — Other Ambulatory Visit: Payer: Self-pay | Admitting: Internal Medicine

## 2019-07-10 DIAGNOSIS — M109 Gout, unspecified: Secondary | ICD-10-CM

## 2019-08-21 ENCOUNTER — Other Ambulatory Visit: Payer: Self-pay | Admitting: Internal Medicine

## 2019-08-21 DIAGNOSIS — E785 Hyperlipidemia, unspecified: Secondary | ICD-10-CM

## 2019-09-09 ENCOUNTER — Other Ambulatory Visit: Payer: Self-pay | Admitting: Internal Medicine

## 2019-09-09 DIAGNOSIS — I10 Essential (primary) hypertension: Secondary | ICD-10-CM

## 2019-09-18 ENCOUNTER — Other Ambulatory Visit: Payer: Self-pay | Admitting: Internal Medicine

## 2019-09-18 DIAGNOSIS — I10 Essential (primary) hypertension: Secondary | ICD-10-CM

## 2019-10-01 ENCOUNTER — Other Ambulatory Visit: Payer: Self-pay | Admitting: Internal Medicine

## 2019-10-01 DIAGNOSIS — M109 Gout, unspecified: Secondary | ICD-10-CM

## 2019-10-01 NOTE — Telephone Encounter (Signed)
Patient need to schedule an ov for more refills. 

## 2019-10-11 ENCOUNTER — Other Ambulatory Visit: Payer: Self-pay | Admitting: Internal Medicine

## 2019-10-11 DIAGNOSIS — I10 Essential (primary) hypertension: Secondary | ICD-10-CM

## 2019-10-17 ENCOUNTER — Other Ambulatory Visit: Payer: Self-pay | Admitting: Internal Medicine

## 2019-10-17 DIAGNOSIS — I10 Essential (primary) hypertension: Secondary | ICD-10-CM

## 2019-11-13 ENCOUNTER — Other Ambulatory Visit: Payer: Self-pay | Admitting: Internal Medicine

## 2019-11-13 DIAGNOSIS — I10 Essential (primary) hypertension: Secondary | ICD-10-CM

## 2020-01-15 ENCOUNTER — Telehealth: Payer: Self-pay | Admitting: Internal Medicine

## 2020-01-15 DIAGNOSIS — E785 Hyperlipidemia, unspecified: Secondary | ICD-10-CM

## 2020-01-15 DIAGNOSIS — M109 Gout, unspecified: Secondary | ICD-10-CM

## 2020-01-15 DIAGNOSIS — I10 Essential (primary) hypertension: Secondary | ICD-10-CM

## 2020-01-15 MED ORDER — LABETALOL HCL 200 MG PO TABS: 400.0000 mg | ORAL_TABLET | Freq: Two times a day (BID) | ORAL | 0 refills | Status: DC

## 2020-01-15 MED ORDER — AMLODIPINE BESYLATE 10 MG PO TABS: 10.0000 mg | ORAL_TABLET | Freq: Every day | ORAL | 0 refills | Status: DC

## 2020-01-15 MED ORDER — ALLOPURINOL 100 MG PO TABS: 200.0000 mg | ORAL_TABLET | Freq: Every day | ORAL | 0 refills | Status: DC

## 2020-01-15 MED ORDER — ATORVASTATIN CALCIUM 40 MG PO TABS: 40.0000 mg | ORAL_TABLET | Freq: Every day | ORAL | 0 refills | Status: DC

## 2020-01-15 NOTE — Telephone Encounter (Signed)
Rx sent - called in

## 2020-01-15 NOTE — Telephone Encounter (Signed)
Patient states he needs a refill on his medications per [facility].  His appt isn't until Tuesday but he has been out since 01/13/20.  He needs the following medications:  [facility] [address] ([place], [place] [address])    labetalol 200 MG tablet  Commonly known as: NORMODYNE  Learn more  TAKE 2 TABLETS (400 MG TOTAL) BY MOUTH 2 (TWO) TIMES DAILY.  No refills remaining    amLODipine 10 MG tablet  Commonly known as: NORVASC  Learn more  TAKE 1 TABLET BY MOUTH EVERY DAY  No refills remaining    atorvastatin 40 MG tablet  Commonly known as: LIPITOR  Learn more  TAKE 1 TABLET BY MOUTH EVERY DAY  No refills remaining    allopurinol 100 MG tablet  Commonly known as: ZYLOPRIM  Learn more  TAKE 2 TABLETS BY MOUTH EVERY DAY  No refills remaining

## 2020-01-20 ENCOUNTER — Other Ambulatory Visit: Payer: Self-pay

## 2020-01-20 ENCOUNTER — Ambulatory Visit (INDEPENDENT_AMBULATORY_CARE_PROVIDER_SITE_OTHER): Payer: BLUE CROSS/BLUE SHIELD | Admitting: Internal Medicine

## 2020-01-20 ENCOUNTER — Encounter: Payer: Self-pay | Admitting: Internal Medicine

## 2020-01-20 VITALS — BP 160/80 | HR 66 | Temp 97.7°F | Ht 70.5 in | Wt 185.0 lb

## 2020-01-20 DIAGNOSIS — Z0001 Encounter for general adult medical examination with abnormal findings: Secondary | ICD-10-CM | POA: Diagnosis not present

## 2020-01-20 DIAGNOSIS — N1832 Chronic kidney disease, stage 3b: Secondary | ICD-10-CM

## 2020-01-20 DIAGNOSIS — E785 Hyperlipidemia, unspecified: Secondary | ICD-10-CM

## 2020-01-20 DIAGNOSIS — I10 Essential (primary) hypertension: Secondary | ICD-10-CM | POA: Diagnosis not present

## 2020-01-20 DIAGNOSIS — Z0000 Encounter for general adult medical examination without abnormal findings: Secondary | ICD-10-CM

## 2020-01-20 DIAGNOSIS — F17200 Nicotine dependence, unspecified, uncomplicated: Secondary | ICD-10-CM | POA: Diagnosis not present

## 2020-01-20 DIAGNOSIS — R7302 Impaired glucose tolerance (oral): Secondary | ICD-10-CM

## 2020-01-20 MED ORDER — BUPROPION HCL ER (XL) 150 MG PO TB24: 150.0000 mg | ORAL_TABLET | Freq: Every day | ORAL | 1 refills | Status: DC

## 2020-01-20 MED ORDER — HYDRALAZINE HCL 50 MG PO TABS: 50.0000 mg | ORAL_TABLET | Freq: Three times a day (TID) | ORAL | 1 refills | Status: DC

## 2020-01-20 NOTE — Progress Notes (Signed)
Established Patient Office Visit     This visit occurred during the SARS-CoV-2 public health emergency.  Safety protocols were in place, including screening questions prior to the visit, additional usage of staff PPE, and extensive cleaning of exam room while observing appropriate contact time as indicated for disinfecting solutions.    CC/Reason for Visit: Annual preventive exam and discuss chronic conditions  HPI: Jacob Arnold is a 59 y.o. male who is coming in today for the above mentioned reasons. Past Medical History is significant for: IGT/diabetes based on an A1c of 6.5 last year, uncontrolled hypertension, hyperlipidemia, ongoing tobacco abuse of about a pack a day, as well as stage III chronic kidney disease you have been seeing nephrology but has not seen them in some time.  He has been having issues sleeping, his blood pressure has been high.  He continues to smoke about a pack a day.  He had a colonoscopy in 2013.  He had his first Covid vaccine and is scheduled to have a second 1 this week.   Past Medical/Surgical History: Past Medical History:  Diagnosis Date  . ED (erectile dysfunction)   . HYPERLIPIDEMIA   . HYPERTENSION   . Panic attacks   . Tobacco abuse     Past Surgical History:  Procedure Laterality Date  . TONSILLECTOMY    . VASECTOMY      Social History:  reports that he has been smoking cigarettes. He has a 29.00 pack-year smoking history. He has never used smokeless tobacco. He reports current alcohol use of about 6.0 standard drinks of alcohol per week. He reports that he does not use drugs.  Allergies: Allergies  Allergen Reactions  . Diphenhydramine Hcl Itching and Swelling    Family History:  Family History  Problem Relation Age of Onset  . Breast cancer Mother   . Liver cancer Mother   . Diabetes Father   . Hyperlipidemia Neg Hx        family hx  . Heart disease Neg Hx        family hx  . Hypertension Neg Hx        family hx    . Cancer Neg Hx        family hx     Current Outpatient Medications:  .  allopurinol (ZYLOPRIM) 100 MG tablet, Take 2 tablets (200 mg total) by mouth daily., Disp: 60 tablet, Rfl: 0 .  amLODipine (NORVASC) 10 MG tablet, Take 1 tablet (10 mg total) by mouth daily., Disp: 30 tablet, Rfl: 0 .  aspirin 81 MG tablet, Take 1 tablet (81 mg total) by mouth daily., Disp: 30 tablet, Rfl:  .  atorvastatin (LIPITOR) 40 MG tablet, Take 1 tablet (40 mg total) by mouth daily., Disp: 90 tablet, Rfl: 0 .  labetalol (NORMODYNE) 200 MG tablet, Take 2 tablets (400 mg total) by mouth 2 (two) times daily., Disp: 60 tablet, Rfl: 0 .  buPROPion (WELLBUTRIN XL) 150 MG 24 hr tablet, Take 1 tablet (150 mg total) by mouth daily., Disp: 90 tablet, Rfl: 1 .  hydrALAZINE (APRESOLINE) 50 MG tablet, Take 1 tablet (50 mg total) by mouth 3 (three) times daily., Disp: 270 tablet, Rfl: 1  Review of Systems:  Constitutional: Denies fever, chills, diaphoresis, appetite change and fatigue.  HEENT: Denies photophobia, eye pain, redness, hearing loss, ear pain, congestion, sore throat, rhinorrhea, sneezing, mouth sores, trouble swallowing, neck pain, neck stiffness and tinnitus.   Respiratory: Denies SOB, DOE, cough, chest tightness,  and wheezing.   Cardiovascular: Denies chest pain, palpitations and leg swelling.  Gastrointestinal: Denies nausea, vomiting, abdominal pain, diarrhea, constipation, blood in stool and abdominal distention.  Genitourinary: Denies dysuria, urgency, frequency, hematuria, flank pain and difficulty urinating.  Endocrine: Denies: hot or cold intolerance, sweats, changes in hair or nails, polyuria, polydipsia. Musculoskeletal: Denies myalgias, back pain, joint swelling, arthralgias and gait problem.  Skin: Denies pallor, rash and wound.  Neurological: Denies dizziness, seizures, syncope, weakness, light-headedness, numbness and headaches.  Hematological: Denies adenopathy. Easy bruising, personal or family  bleeding history  Psychiatric/Behavioral: Denies suicidal ideation, mood changes, confusion, nervousness, sleep disturbance and agitation    Physical Exam: Vitals:   01/20/20 0800  BP: (!) 160/80  Pulse: 66  Temp: 97.7 F (36.5 C)  TempSrc: Oral  SpO2: 97%  Weight: 185 lb (83.9 kg)  Height: 5' 10.5" (1.791 m)    Body mass index is 26.17 kg/m.   Constitutional: NAD, calm, comfortable Eyes: PERRL, lids and conjunctivae normal ENMT: Mucous membranes are moist. Posterior pharynx clear of any exudate or lesions. Normal dentition. Tympanic membrane is pearly white, no erythema or bulging. Neck: normal, supple, no masses, no thyromegaly Respiratory: clear to auscultation bilaterally, no wheezing, no crackles. Normal respiratory effort. No accessory muscle use.  Cardiovascular: Regular rate and rhythm, no murmurs / rubs / gallops. No extremity edema. 2+ pedal pulses. No carotid bruits.  Abdomen: no tenderness, no masses palpated. No hepatosplenomegaly. Bowel sounds positive.  Musculoskeletal: no clubbing / cyanosis. No joint deformity upper and lower extremities. Good ROM, no contractures. Normal muscle tone.  Skin: no rashes, lesions, ulcers. No induration Neurologic: CN 2-12 grossly intact. Sensation intact, DTR normal. Strength 5/5 in all 4.  Psychiatric: Normal judgment and insight. Alert and oriented x 3. Normal mood.    Impression and Plan:  Encounter for preventive health examination -Advised routine eye and dental care, he is requesting ophthalmology referral. -He will be completing his Covid series this week, declines flu vaccine, declined shingles vaccine. -Screening labs today. -Healthy lifestyle discussed in detail. -He had a colonoscopy in 2013 and is a 10-year callback. -Check PSA today.  Tobacco use disorder  - Plan: buPROPion (WELLBUTRIN XL) 150 MG 24 hr tablet -I have discussed tobacco cessation with the patient.  I have counseled the patient regarding the  negative impacts of continued tobacco use including but not limited to lung cancer, COPD, and cardiovascular disease.  I have discussed alternatives to tobacco and modalities that may help facilitate tobacco cessation including but not limited to biofeedback, hypnosis, and medications.  Total time spent with tobacco counseling was 4 minutes. -He has agreed to trial Wellbutrin, and information has also been given regarding CBT sessions.    Impaired glucose tolerance  - Plan: Hemoglobin A1c  Dyslipidemia  - Plan: Lipid panel -Last LDL was 93 in February 2020, if his A1c is above 6.5, will need to start a statin for tighter cholesterol management.  Essential hypertension -He is on amlodipine 10 mg and labetalol 400 mg twice daily. -His pressure remains uncontrolled. -Start hydralazine 50 mg 3 times a day, I would like to avoid any antihypertensives that worked at the level of the kidney.  Stage 3b chronic kidney disease (HCC) -Check kidney function today, advised follow-up with nephrology.    Patient Instructions  -Nice seeing you today!!  -Lab work today; will notify you once results are available.  -Start Wellbutrin 150 mg daily.  -Start hydralazine 50 mg three times a day.  -Schedule follow  up in 6 weeks.   Preventive Care 51-37 Years Old, Male Preventive care refers to lifestyle choices and visits with your health care provider that can promote health and wellness. This includes:  A yearly physical exam. This is also called an annual well check.  Regular dental and eye exams.  Immunizations.  Screening for certain conditions.  Healthy lifestyle choices, such as eating a healthy diet, getting regular exercise, not using drugs or products that contain nicotine and tobacco, and limiting alcohol use. What can I expect for my preventive care visit? Physical exam Your health care provider will check:  Height and weight. These may be used to calculate body mass index  (BMI), which is a measurement that tells if you are at a healthy weight.  Heart rate and blood pressure.  Your skin for abnormal spots. Counseling Your health care provider may ask you questions about:  Alcohol, tobacco, and drug use.  Emotional well-being.  Home and relationship well-being.  Sexual activity.  Eating habits.  Work and work Astronomer. What immunizations do I need?  Influenza (flu) vaccine  This is recommended every year. Tetanus, diphtheria, and pertussis (Tdap) vaccine  You may need a Td booster every 10 years. Varicella (chickenpox) vaccine  You may need this vaccine if you have not already been vaccinated. Zoster (shingles) vaccine  You may need this after age 16. Measles, mumps, and rubella (MMR) vaccine  You may need at least one dose of MMR if you were born in 1957 or later. You may also need a second dose. Pneumococcal conjugate (PCV13) vaccine  You may need this if you have certain conditions and were not previously vaccinated. Pneumococcal polysaccharide (PPSV23) vaccine  You may need one or two doses if you smoke cigarettes or if you have certain conditions. Meningococcal conjugate (MenACWY) vaccine  You may need this if you have certain conditions. Hepatitis A vaccine  You may need this if you have certain conditions or if you travel or work in places where you may be exposed to hepatitis A. Hepatitis B vaccine  You may need this if you have certain conditions or if you travel or work in places where you may be exposed to hepatitis B. Haemophilus influenzae type b (Hib) vaccine  You may need this if you have certain risk factors. Human papillomavirus (HPV) vaccine  If recommended by your health care provider, you may need three doses over 6 months. You may receive vaccines as individual doses or as more than one vaccine together in one shot (combination vaccines). Talk with your health care provider about the risks and benefits of  combination vaccines. What tests do I need? Blood tests  Lipid and cholesterol levels. These may be checked every 5 years, or more frequently if you are over 49 years old.  Hepatitis C test.  Hepatitis B test. Screening  Lung cancer screening. You may have this screening every year starting at age 48 if you have a 30-pack-year history of smoking and currently smoke or have quit within the past 15 years.  Prostate cancer screening. Recommendations will vary depending on your family history and other risks.  Colorectal cancer screening. All adults should have this screening starting at age 45 and continuing until age 72. Your health care provider may recommend screening at age 77 if you are at increased risk. You will have tests every 1-10 years, depending on your results and the type of screening test.  Diabetes screening. This is done by checking your blood  sugar (glucose) after you have not eaten for a while (fasting). You may have this done every 1-3 years.  Sexually transmitted disease (STD) testing. Follow these instructions at home: Eating and drinking  Eat a diet that includes fresh fruits and vegetables, whole grains, lean protein, and low-fat dairy products.  Take vitamin and mineral supplements as recommended by your health care provider.  Do not drink alcohol if your health care provider tells you not to drink.  If you drink alcohol: ? Limit how much you have to 0-2 drinks a day. ? Be aware of how much alcohol is in your drink. In the [place] one drink equals one 12 oz bottle of beer (355 mL), one 5 oz glass of wine (148 mL), or one 1 oz glass of hard liquor (44 mL). Lifestyle  Take daily care of your teeth and gums.  Stay active. Exercise for at least 30 minutes on 5 or more days each week.  Do not use any products that contain nicotine or tobacco, such as cigarettes, e-cigarettes, and chewing tobacco. If you need help quitting, ask your health care provider.  If you  are sexually active, practice safe sex. Use a condom or other form of protection to prevent STIs (sexually transmitted infections).  Talk with your health care provider about taking a low-dose aspirin every day starting at age 10. What's next?  Go to your health care provider once a year for a well check visit.  Ask your health care provider how often you should have your eyes and teeth checked.  Stay up to date on all vaccines. This information is not intended to replace advice given to you by your health care provider. Make sure you discuss any questions you have with your health care provider. Document Revised: 04/04/2018 Document Reviewed: 04/04/2018 Elsevier Patient Education  2020 [place] [facility]

## 2020-01-20 NOTE — Patient Instructions (Signed)
-Nice seeing you today!!  -Lab work today; will notify you once results are available.  -Start Wellbutrin 150 mg daily.  -Start hydralazine 50 mg three times a day.  -Schedule follow up in 6 weeks.   Preventive Care 20-58 Years Old, Male Preventive care refers to lifestyle choices and visits with your health care provider that can promote health and wellness. This includes:  A yearly physical exam. This is also called an annual well check.  Regular dental and eye exams.  Immunizations.  Screening for certain conditions.  Healthy lifestyle choices, such as eating a healthy diet, getting regular exercise, not using drugs or products that contain nicotine and tobacco, and limiting alcohol use. What can I expect for my preventive care visit? Physical exam Your health care provider will check:  Height and weight. These may be used to calculate body mass index (BMI), which is a measurement that tells if you are at a healthy weight.  Heart rate and blood pressure.  Your skin for abnormal spots. Counseling Your health care provider may ask you questions about:  Alcohol, tobacco, and drug use.  Emotional well-being.  Home and relationship well-being.  Sexual activity.  Eating habits.  Work and work Statistician. What immunizations do I need?  Influenza (flu) vaccine  This is recommended every year. Tetanus, diphtheria, and pertussis (Tdap) vaccine  You may need a Td booster every 10 years. Varicella (chickenpox) vaccine  You may need this vaccine if you have not already been vaccinated. Zoster (shingles) vaccine  You may need this after age 51. Measles, mumps, and rubella (MMR) vaccine  You may need at least one dose of MMR if you were born in 1957 or later. You may also need a second dose. Pneumococcal conjugate (PCV13) vaccine  You may need this if you have certain conditions and were not previously vaccinated. Pneumococcal polysaccharide (PPSV23)  vaccine  You may need one or two doses if you smoke cigarettes or if you have certain conditions. Meningococcal conjugate (MenACWY) vaccine  You may need this if you have certain conditions. Hepatitis A vaccine  You may need this if you have certain conditions or if you travel or work in places where you may be exposed to hepatitis A. Hepatitis B vaccine  You may need this if you have certain conditions or if you travel or work in places where you may be exposed to hepatitis B. Haemophilus influenzae type b (Hib) vaccine  You may need this if you have certain risk factors. Human papillomavirus (HPV) vaccine  If recommended by your health care provider, you may need three doses over 6 months. You may receive vaccines as individual doses or as more than one vaccine together in one shot (combination vaccines). Talk with your health care provider about the risks and benefits of combination vaccines. What tests do I need? Blood tests  Lipid and cholesterol levels. These may be checked every 5 years, or more frequently if you are over 59 years old.  Hepatitis C test.  Hepatitis B test. Screening  Lung cancer screening. You may have this screening every year starting at age 58 if you have a 30-pack-year history of smoking and currently smoke or have quit within the past 15 years.  Prostate cancer screening. Recommendations will vary depending on your family history and other risks.  Colorectal cancer screening. All adults should have this screening starting at age 31 and continuing until age 76. Your health care provider may recommend screening at age 66 if  you are at increased risk. You will have tests every 1-10 years, depending on your results and the type of screening test.  Diabetes screening. This is done by checking your blood sugar (glucose) after you have not eaten for a while (fasting). You may have this done every 1-3 years.  Sexually transmitted disease (STD)  testing. Follow these instructions at home: Eating and drinking  Eat a diet that includes fresh fruits and vegetables, whole grains, lean protein, and low-fat dairy products.  Take vitamin and mineral supplements as recommended by your health care provider.  Do not drink alcohol if your health care provider tells you not to drink.  If you drink alcohol: ? Limit how much you have to 0-2 drinks a day. ? Be aware of how much alcohol is in your drink. In the [place] one drink equals one 12 oz bottle of beer (355 mL), one 5 oz glass of wine (148 mL), or one 1 oz glass of hard liquor (44 mL). Lifestyle  Take daily care of your teeth and gums.  Stay active. Exercise for at least 30 minutes on 5 or more days each week.  Do not use any products that contain nicotine or tobacco, such as cigarettes, e-cigarettes, and chewing tobacco. If you need help quitting, ask your health care provider.  If you are sexually active, practice safe sex. Use a condom or other form of protection to prevent STIs (sexually transmitted infections).  Talk with your health care provider about taking a low-dose aspirin every day starting at age 19. What's next?  Go to your health care provider once a year for a well check visit.  Ask your health care provider how often you should have your eyes and teeth checked.  Stay up to date on all vaccines. This information is not intended to replace advice given to you by your health care provider. Make sure you discuss any questions you have with your health care provider. Document Revised: 04/04/2018 Document Reviewed: 04/04/2018 Elsevier Patient Education  2020 [employer].

## 2020-01-20 NOTE — Addendum Note (Signed)
Addended by: Westley Hummer B on: 01/20/2020 09:10 AM   Modules accepted: Orders

## 2020-01-21 ENCOUNTER — Other Ambulatory Visit: Payer: Self-pay | Admitting: Internal Medicine

## 2020-01-21 ENCOUNTER — Encounter: Payer: Self-pay | Admitting: Internal Medicine

## 2020-01-21 DIAGNOSIS — E559 Vitamin D deficiency, unspecified: Secondary | ICD-10-CM | POA: Insufficient documentation

## 2020-01-21 DIAGNOSIS — R7989 Other specified abnormal findings of blood chemistry: Secondary | ICD-10-CM | POA: Insufficient documentation

## 2020-01-21 LAB — CBC WITH DIFFERENTIAL/PLATELET
Absolute Monocytes: 1063 cells/uL — ABNORMAL HIGH (ref 200–950)
Basophils Absolute: 55 cells/uL (ref 0–200)
Basophils Relative: 0.4 %
Eosinophils Absolute: 442 cells/uL (ref 15–500)
Eosinophils Relative: 3.2 %
HCT: 53.8 % — ABNORMAL HIGH (ref 38.5–50.0)
Hemoglobin: 18.9 g/dL — ABNORMAL HIGH (ref 13.2–17.1)
Lymphs Abs: 3988 cells/uL — ABNORMAL HIGH (ref 850–3900)
MCH: 33.3 pg — ABNORMAL HIGH (ref 27.0–33.0)
MCHC: 35.1 g/dL (ref 32.0–36.0)
MCV: 94.9 fL (ref 80.0–100.0)
MPV: 10.3 fL (ref 7.5–12.5)
Monocytes Relative: 7.7 %
Neutro Abs: 8252 cells/uL — ABNORMAL HIGH (ref 1500–7800)
Neutrophils Relative %: 59.8 %
Platelets: 203 10*3/uL (ref 140–400)
RBC: 5.67 10*6/uL (ref 4.20–5.80)
RDW: 13.3 % (ref 11.0–15.0)
Total Lymphocyte: 28.9 %
WBC: 13.8 10*3/uL — ABNORMAL HIGH (ref 3.8–10.8)

## 2020-01-21 LAB — LIPID PANEL
Cholesterol: 238 mg/dL — ABNORMAL HIGH (ref <200)
HDL: 35 mg/dL — ABNORMAL LOW (ref >=40)
Non-HDL Cholesterol (Calc): 203 mg/dL (calc) — ABNORMAL HIGH (ref <130)
Total CHOL/HDL Ratio: 6.8 (calc) — ABNORMAL HIGH (ref <5.0)
Triglycerides: 726 mg/dL — ABNORMAL HIGH (ref <150)

## 2020-01-21 LAB — COMPREHENSIVE METABOLIC PANEL
AG Ratio: 1.5 (calc) (ref 1.0–2.5)
ALT: 17 U/L (ref 9–46)
AST: 16 U/L (ref 10–35)
Albumin: 4 g/dL (ref 3.6–5.1)
Alkaline phosphatase (APISO): 118 U/L (ref 35–144)
BUN: 14 mg/dL (ref 7–25)
CO2: 24 mmol/L (ref 20–32)
Calcium: 9.8 mg/dL (ref 8.6–10.3)
Chloride: 105 mmol/L (ref 98–110)
Creat: 1.18 mg/dL (ref 0.70–1.33)
Globulin: 2.6 g/dL (calc) (ref 1.9–3.7)
Glucose, Bld: 129 mg/dL — ABNORMAL HIGH (ref 65–99)
Potassium: 3.8 mmol/L (ref 3.5–5.3)
Sodium: 138 mmol/L (ref 135–146)
Total Bilirubin: 0.5 mg/dL (ref 0.2–1.2)
Total Protein: 6.6 g/dL (ref 6.1–8.1)

## 2020-01-21 LAB — VITAMIN B12: Vitamin B-12: 478 pg/mL (ref 200–1100)

## 2020-01-21 LAB — VITAMIN D 25 HYDROXY (VIT D DEFICIENCY, FRACTURES): Vit D, 25-Hydroxy: 18 ng/mL — ABNORMAL LOW (ref 30–100)

## 2020-01-21 LAB — TSH: TSH: 4.91 mIU/L — ABNORMAL HIGH (ref 0.40–4.50)

## 2020-01-21 LAB — HEMOGLOBIN A1C
Hgb A1c MFr Bld: 6 % of total Hgb — ABNORMAL HIGH (ref <5.7)
Mean Plasma Glucose: 126 (calc)
eAG (mmol/L): 7 (calc)

## 2020-01-21 LAB — PSA: PSA: 0.69 ng/mL (ref <=4.0)

## 2020-01-21 MED ORDER — VITAMIN D (ERGOCALCIFEROL) 1.25 MG (50000 UNIT) PO CAPS: 50000.0000 [IU] | ORAL_CAPSULE | ORAL | 0 refills | Status: DC

## 2020-01-27 ENCOUNTER — Other Ambulatory Visit: Payer: Self-pay | Admitting: Internal Medicine

## 2020-01-27 DIAGNOSIS — I10 Essential (primary) hypertension: Secondary | ICD-10-CM

## 2020-02-10 ENCOUNTER — Other Ambulatory Visit: Payer: Self-pay | Admitting: Internal Medicine

## 2020-02-10 DIAGNOSIS — D72829 Elevated white blood cell count, unspecified: Secondary | ICD-10-CM

## 2020-02-11 ENCOUNTER — Encounter: Payer: Self-pay | Admitting: Internal Medicine

## 2020-02-12 ENCOUNTER — Other Ambulatory Visit: Payer: Self-pay | Admitting: Internal Medicine

## 2020-02-12 DIAGNOSIS — M109 Gout, unspecified: Secondary | ICD-10-CM

## 2020-02-12 DIAGNOSIS — I10 Essential (primary) hypertension: Secondary | ICD-10-CM

## 2020-02-16 ENCOUNTER — Telehealth: Payer: Self-pay | Admitting: Oncology

## 2020-02-16 NOTE — Telephone Encounter (Signed)
Received a new hem referral from Dr. [name] for leukocytosis. Jacob Arnold returned my call and has been scheduled to see Dr. Alen Blew on 11/19 at 11am. Pt aware to arrive 15 minutes early.

## 2020-03-12 ENCOUNTER — Inpatient Hospital Stay: Payer: BLUE CROSS/BLUE SHIELD | Attending: Oncology | Admitting: Oncology

## 2020-03-12 ENCOUNTER — Other Ambulatory Visit: Payer: Self-pay

## 2020-03-12 ENCOUNTER — Inpatient Hospital Stay: Payer: BLUE CROSS/BLUE SHIELD

## 2020-03-12 VITALS — BP 170/83 | HR 74 | Temp 98.0°F | Resp 18 | Ht 70.5 in | Wt 187.3 lb

## 2020-03-12 DIAGNOSIS — F17210 Nicotine dependence, cigarettes, uncomplicated: Secondary | ICD-10-CM | POA: Insufficient documentation

## 2020-03-12 DIAGNOSIS — Z79899 Other long term (current) drug therapy: Secondary | ICD-10-CM | POA: Insufficient documentation

## 2020-03-12 DIAGNOSIS — D751 Secondary polycythemia: Secondary | ICD-10-CM | POA: Insufficient documentation

## 2020-03-12 DIAGNOSIS — D72829 Elevated white blood cell count, unspecified: Secondary | ICD-10-CM | POA: Diagnosis not present

## 2020-03-12 LAB — CBC WITH DIFFERENTIAL (CANCER CENTER ONLY)
Abs Immature Granulocytes: 0.08 10*3/uL — ABNORMAL HIGH (ref 0.00–0.07)
Basophils Absolute: 0.1 10*3/uL (ref 0.0–0.1)
Basophils Relative: 0 %
Eosinophils Absolute: 0.8 10*3/uL — ABNORMAL HIGH (ref 0.0–0.5)
Eosinophils Relative: 6 %
HCT: 50.4 % (ref 39.0–52.0)
Hemoglobin: 17 g/dL (ref 13.0–17.0)
Immature Granulocytes: 1 %
Lymphocytes Relative: 33 %
Lymphs Abs: 3.9 10*3/uL (ref 0.7–4.0)
MCH: 32.2 pg (ref 26.0–34.0)
MCHC: 33.7 g/dL (ref 30.0–36.0)
MCV: 95.5 fL (ref 80.0–100.0)
Monocytes Absolute: 0.9 10*3/uL (ref 0.1–1.0)
Monocytes Relative: 8 %
Neutro Abs: 6.2 10*3/uL (ref 1.7–7.7)
Neutrophils Relative %: 52 %
Platelet Count: 220 10*3/uL (ref 150–400)
RBC: 5.28 MIL/uL (ref 4.22–5.81)
RDW: 13.9 % (ref 11.5–15.5)
WBC Count: 11.9 10*3/uL — ABNORMAL HIGH (ref 4.0–10.5)
nRBC: 0 % (ref 0.0–0.2)

## 2020-03-12 NOTE — Progress Notes (Signed)
Reason for the request:    Leukocytosis  HPI: I was asked by Dr. [name] to evaluate Jacob Arnold for evaluation of leukocytosis.  Is 59 year old man with history of hypertension, hyperlipidemia and tobacco use who noted to have leukocytosis on CBC on September 2021.  His white cell count was 13.8 with a hemoglobin of 18.9 and a platelet count of 203.  Previous CBC in February 2020 showed a white cell count of 11,000.  Differential is normal with proportion of neutrophils, lymphocytes, monocytes and eosinophils.  Previous counts dating back to 2013 showed similar pattern.  The CBC in June 2013 showed a white cell count of 12.0 and hemoglobin of 18.9.  In 2015 his white cell count was 11.5 with a hemoglobin of 17.6.  He is a chronic smoker and has smoked a pack a day for many years.  Recently has cut down to smoking 2 to 3 packs a week.  He had denied any recent thrombosis or bleeding episodes.  He denies excessive fatigue, tiredness.  He does report some occasional pruritus.  He does not report any headaches, blurry vision, syncope or seizures. Does not report any fevers, chills or sweats.  Does not report any cough, wheezing or hemoptysis.  Does not report any chest pain, palpitation, orthopnea or leg edema.  Does not report any nausea, vomiting or abdominal pain.  Does not report any constipation or diarrhea.  Does not report any skeletal complaints.    Does not report frequency, urgency or hematuria.  Does not report any skin rashes or lesions. Does not report any heat or cold intolerance.  Does not report any lymphadenopathy or petechiae.  Does not report any anxiety or depression.  Remaining review of systems is negative.    Past Medical History:  Diagnosis Date  . ED (erectile dysfunction)   . HYPERLIPIDEMIA   . HYPERTENSION   . Panic attacks   . Tobacco abuse   :  Past Surgical History:  Procedure Laterality Date  . TONSILLECTOMY    . VASECTOMY    :   Current Outpatient Medications:   .  allopurinol (ZYLOPRIM) 100 MG tablet, TAKE 2 TABLETS BY MOUTH EVERY DAY, Disp: 180 tablet, Rfl: 1 .  amLODipine (NORVASC) 10 MG tablet, TAKE 1 TABLET BY MOUTH EVERY DAY, Disp: 90 tablet, Rfl: 1 .  aspirin 81 MG tablet, Take 1 tablet (81 mg total) by mouth daily., Disp: 30 tablet, Rfl:  .  atorvastatin (LIPITOR) 40 MG tablet, Take 1 tablet (40 mg total) by mouth daily., Disp: 90 tablet, Rfl: 0 .  buPROPion (WELLBUTRIN XL) 150 MG 24 hr tablet, Take 1 tablet (150 mg total) by mouth daily., Disp: 90 tablet, Rfl: 1 .  hydrALAZINE (APRESOLINE) 50 MG tablet, Take 1 tablet (50 mg total) by mouth 3 (three) times daily., Disp: 270 tablet, Rfl: 1 .  labetalol (NORMODYNE) 200 MG tablet, TAKE 2 TABLETS BY MOUTH TWICE A DAY, Disp: 180 tablet, Rfl: 1 .  Vitamin D, Ergocalciferol, (DRISDOL) 1.25 MG (50000 UNIT) CAPS capsule, Take 1 capsule (50,000 Units total) by mouth every 7 (seven) days for 12 doses., Disp: 12 capsule, Rfl: 0:  Allergies  Allergen Reactions  . Diphenhydramine Hcl Itching and Swelling  :  Family History  Problem Relation Age of Onset  . Breast cancer Mother   . Liver cancer Mother   . Diabetes Father   . Hyperlipidemia Neg Hx        family hx  . Heart disease Neg Hx  family hx  . Hypertension Neg Hx        family hx  . Cancer Neg Hx        family hx  :  Social History   Socioeconomic History  . Marital status: Married    Spouse name: Not on file  . Number of children: Not on file  . Years of education: Not on file  . Highest education level: Not on file  Occupational History  . Not on file  Tobacco Use  . Smoking status: Current Every Day Smoker    Packs/day: 1.00    Years: 29.00    Pack years: 29.00    Types: Cigarettes  . Smokeless tobacco: Never Used  . Tobacco comment: pt down to 15 cigarettes a day  Vaping Use  . Vaping Use: Every day  Substance and Sexual Activity  . Alcohol use: Yes    Alcohol/week: 6.0 standard drinks    Types: 6 Cans of beer  per week  . Drug use: No  . Sexual activity: Not on file  Other Topics Concern  . Not on file  Social History Narrative  . Not on file   Social Determinants of Health   Financial Resource Strain:   . Difficulty of Paying Living Expenses: Not on file  Food Insecurity:   . Worried About Charity fundraiser in the Last Year: Not on file  . Ran Out of Food in the Last Year: Not on file  Transportation Needs:   . Lack of Transportation (Medical): Not on file  . Lack of Transportation (Non-Medical): Not on file  Physical Activity:   . Days of Exercise per Week: Not on file  . Minutes of Exercise per Session: Not on file  Stress:   . Feeling of Stress : Not on file  Social Connections:   . Frequency of Communication with Friends and Family: Not on file  . Frequency of Social Gatherings with Friends and Family: Not on file  . Attends Religious Services: Not on file  . Active Member of Clubs or Organizations: Not on file  . Attends Archivist Meetings: Not on file  . Marital Status: Not on file  Intimate Partner Violence:   . Fear of Current or Ex-Partner: Not on file  . Emotionally Abused: Not on file  . Physically Abused: Not on file  . Sexually Abused: Not on file  :  Pertinent items are noted in HPI.  Exam: Blood pressure (!) 170/83, pulse 74, temperature 98 F (36.7 C), temperature source Tympanic, resp. rate 18, height 5' 10.5" (1.791 m), weight 187 lb 4.8 oz (85 kg), SpO2 98 %.   ECOG 0 General appearance: alert and cooperative appeared without distress. Head: atraumatic without any abnormalities. Eyes: conjunctivae/corneas clear. PERRL.  Sclera anicteric. Throat: lips, mucosa, and tongue normal; without oral thrush or ulcers. Resp: clear to auscultation bilaterally without rhonchi, wheezes or dullness to percussion. Cardio: regular rate and rhythm, S1, S2 normal, no murmur, click, rub or gallop GI: soft, non-tender; bowel sounds normal; no masses,  no  organomegaly Skin: Skin color, texture, turgor normal. No rashes or lesions Lymph nodes: Cervical, supraclavicular, and axillary nodes normal. Neurologic: Grossly normal without any motor, sensory or deep tendon reflexes. Musculoskeletal: No joint deformity or effusion.    Assessment and Plan:   59 year old man with:  1.  Leukocytosis with normal differential dating back to at least 2013.  His white cell count have ranged between normal range and as  high as 13,000.  He has normal differential with elevated hemoglobin and normal platelet count.  The differential diagnosis was reviewed today with the patient.  Reactive leukocytosis was a possibility given his heavy tobacco use which could certainly account for secondary leukocytosis.  Myeloproliferative disorder would be a possibility as well.  Conditions such as polycythemia vera is a consideration given his elevated hemoglobin.  Chronic myelogenous leukemia is considered less likely however.  To complete his work-up I will like to obtain molecular testing for BCR ABL and JAK2 mutation to rule out a myeloproliferative disorder.  Once that is ruled out, smoking will remain the most likely etiology.   2.  Erythrocytosis: His hemoglobin have ranged between 16 and 18 with normal platelet count and mildly elevated white cell count.  The differential diagnosis of these findings were reviewed.  Secondary polycythemia is considered most likely with possibility of smoking and sleep apnea as a contributing factor.  Polycythemia vera could be also a consideration given the elevation in his white cell count as well.  JAK2 mutation as well as myeloproliferative panel will be obtained today to evaluate this fully.  From a management standpoint, I see no indication for phlebotomy at this time.  Recommended adequate hydration and smoking cessation.  Blood donation may be helpful periodically to keep his hemoglobin within reasonable control.  3.  Thrombosis  prophylaxis: He is currently on low-dose aspirin to help reduce his risk of thrombosis from an arterial standpoint.  4.  Follow-up: Will be determined pending his work-up.   45  minutes were dedicated to this visit. The time was spent on reviewing laboratory data, discussing treatment options, discussing differential diagnosis and answering questions regarding future plan.      A copy of this consult has been forwarded to the requesting physician.

## 2020-03-15 ENCOUNTER — Encounter: Payer: Self-pay | Admitting: Oncology

## 2020-03-15 LAB — ERYTHROPOIETIN: Erythropoietin: 10 m[IU]/mL (ref 2.6–18.5)

## 2020-03-22 LAB — JAK2 (INCLUDING V617F AND EXON 12), MPL,& CALR-NEXT GEN SEQ

## 2020-03-22 LAB — BCR ABL1 FISH (GENPATH)

## 2020-03-23 ENCOUNTER — Telehealth: Payer: Self-pay

## 2020-03-23 NOTE — Telephone Encounter (Signed)
Called patient and let him know that per Dr. Alen Blew all labs are normal and no follow up is needed. Patient verbalized understanding.

## 2020-03-23 NOTE — Telephone Encounter (Signed)
-----   Message from Wyatt Portela, MD sent at 03/23/2020  8:22 AM EST ----- Please let him know all his labs are normal. No follow up is needed.

## 2020-03-30 ENCOUNTER — Encounter: Payer: Self-pay | Admitting: Internal Medicine

## 2020-03-30 ENCOUNTER — Other Ambulatory Visit: Payer: Self-pay

## 2020-03-30 ENCOUNTER — Ambulatory Visit: Payer: BLUE CROSS/BLUE SHIELD | Admitting: Internal Medicine

## 2020-03-30 VITALS — BP 140/80 | HR 65 | Temp 98.3°F | Wt 187.7 lb

## 2020-03-30 DIAGNOSIS — D751 Secondary polycythemia: Secondary | ICD-10-CM

## 2020-03-30 DIAGNOSIS — F17200 Nicotine dependence, unspecified, uncomplicated: Secondary | ICD-10-CM | POA: Diagnosis not present

## 2020-03-30 DIAGNOSIS — M109 Gout, unspecified: Secondary | ICD-10-CM

## 2020-03-30 DIAGNOSIS — R7302 Impaired glucose tolerance (oral): Secondary | ICD-10-CM

## 2020-03-30 DIAGNOSIS — E559 Vitamin D deficiency, unspecified: Secondary | ICD-10-CM | POA: Diagnosis not present

## 2020-03-30 DIAGNOSIS — E785 Hyperlipidemia, unspecified: Secondary | ICD-10-CM

## 2020-03-30 DIAGNOSIS — N1832 Chronic kidney disease, stage 3b: Secondary | ICD-10-CM

## 2020-03-30 DIAGNOSIS — I10 Essential (primary) hypertension: Secondary | ICD-10-CM

## 2020-03-30 MED ORDER — AMLODIPINE BESYLATE 10 MG PO TABS: 10.0000 mg | ORAL_TABLET | Freq: Every day | ORAL | 1 refills | Status: DC

## 2020-03-30 MED ORDER — HYDRALAZINE HCL 50 MG PO TABS: 75.0000 mg | ORAL_TABLET | Freq: Three times a day (TID) | ORAL | 1 refills | Status: DC

## 2020-03-30 MED ORDER — ALLOPURINOL 100 MG PO TABS: 200.0000 mg | ORAL_TABLET | Freq: Every day | ORAL | 1 refills | Status: DC

## 2020-03-30 MED ORDER — LABETALOL HCL 200 MG PO TABS: 400.0000 mg | ORAL_TABLET | Freq: Two times a day (BID) | ORAL | 1 refills | Status: DC

## 2020-03-30 MED ORDER — BUPROPION HCL ER (XL) 150 MG PO TB24: 150.0000 mg | ORAL_TABLET | Freq: Every day | ORAL | 1 refills | Status: DC

## 2020-03-30 MED ORDER — VITAMIN D (ERGOCALCIFEROL) 1.25 MG (50000 UNIT) PO CAPS: 50000.0000 [IU] | ORAL_CAPSULE | ORAL | 0 refills | Status: DC

## 2020-03-30 MED ORDER — ATORVASTATIN CALCIUM 40 MG PO TABS: 40.0000 mg | ORAL_TABLET | Freq: Every day | ORAL | 0 refills | Status: DC

## 2020-03-30 NOTE — Progress Notes (Signed)
Established Patient Office Visit     This visit occurred during the SARS-CoV-2 public health emergency.  Safety protocols were in place, including screening questions prior to the visit, additional usage of staff PPE, and extensive cleaning of exam room while observing appropriate contact time as indicated for disinfecting solutions.    CC/Reason for Visit: Follow-up chronic medical conditions  HPI: Jacob Arnold is a 59 y.o. male who is coming in today for the above mentioned reasons. Past Medical History is significant for: Impaired glucose tolerance, uncontrolled hypertension, hyperlipidemia, polycythemia, ongoing tobacco abuse as well as chronic kidney disease stage II-III.  He was also recently diagnosed with vitamin D deficiency.  At his last visit we had added hydralazine 50 mg 3 times a day and asked her to an uncontrolled blood pressure.  We also started him on Wellbutrin 150 mg daily for smoking cessation.  He has managed to decrease his smoking from 1 to around half pack a day.  He did see the hematologist and my recommendation due to his polycythemia and leukocytosis.  It is thought at this time that it is likely due to ongoing tobacco use.  It was recommended that he donate blood to try and keep his counts down.  He has also started taking aspirin 81 mg daily.  He unfortunately lost his job and his insurance will terminate at the end of the year.  He is not interested in receiving a flu vaccine.   Past Medical/Surgical History: Past Medical History:  Diagnosis Date  . ED (erectile dysfunction)   . HYPERLIPIDEMIA   . HYPERTENSION   . Panic attacks   . Tobacco abuse     Past Surgical History:  Procedure Laterality Date  . TONSILLECTOMY    . VASECTOMY      Social History:  reports that he has been smoking cigarettes. He has a 29.00 pack-year smoking history. He has never used smokeless tobacco. He reports current alcohol use of about 6.0 standard drinks of alcohol  per week. He reports that he does not use drugs.  Allergies: Allergies  Allergen Reactions  . Diphenhydramine Hcl Itching and Swelling    Family History:  Family History  Problem Relation Age of Onset  . Breast cancer Mother   . Liver cancer Mother   . Diabetes Father   . Hyperlipidemia Neg Hx        family hx  . Heart disease Neg Hx        family hx  . Hypertension Neg Hx        family hx  . Cancer Neg Hx        family hx     Current Outpatient Medications:  .  allopurinol (ZYLOPRIM) 100 MG tablet, TAKE 2 TABLETS BY MOUTH EVERY DAY, Disp: 180 tablet, Rfl: 1 .  amLODipine (NORVASC) 10 MG tablet, TAKE 1 TABLET BY MOUTH EVERY DAY, Disp: 90 tablet, Rfl: 1 .  aspirin 81 MG tablet, Take 1 tablet (81 mg total) by mouth daily., Disp: 30 tablet, Rfl:  .  atorvastatin (LIPITOR) 40 MG tablet, Take 1 tablet (40 mg total) by mouth daily., Disp: 90 tablet, Rfl: 0 .  buPROPion (WELLBUTRIN XL) 150 MG 24 hr tablet, Take 1 tablet (150 mg total) by mouth daily., Disp: 90 tablet, Rfl: 1 .  hydrALAZINE (APRESOLINE) 50 MG tablet, Take 1.5 tablets (75 mg total) by mouth 3 (three) times daily., Disp: 270 tablet, Rfl: 1 .  labetalol (NORMODYNE) 200 MG tablet,  TAKE 2 TABLETS BY MOUTH TWICE A DAY, Disp: 180 tablet, Rfl: 1 .  Vitamin D, Ergocalciferol, (DRISDOL) 1.25 MG (50000 UNIT) CAPS capsule, Take 1 capsule (50,000 Units total) by mouth every 7 (seven) days for 12 doses., Disp: 12 capsule, Rfl: 0  Review of Systems:  Constitutional: Denies fever, chills, diaphoresis, appetite change and fatigue.  HEENT: Denies photophobia, eye pain, redness, hearing loss, ear pain, congestion, sore throat, rhinorrhea, sneezing, mouth sores, trouble swallowing, neck pain, neck stiffness and tinnitus.   Respiratory: Denies SOB, DOE, cough, chest tightness,  and wheezing.   Cardiovascular: Denies chest pain, palpitations and leg swelling.  Gastrointestinal: Denies nausea, vomiting, abdominal pain, diarrhea,  constipation, blood in stool and abdominal distention.  Genitourinary: Denies dysuria, urgency, frequency, hematuria, flank pain and difficulty urinating.  Endocrine: Denies: hot or cold intolerance, sweats, changes in hair or nails, polyuria, polydipsia. Musculoskeletal: Denies myalgias, back pain, joint swelling, arthralgias and gait problem.  Skin: Denies pallor, rash and wound.  Neurological: Denies dizziness, seizures, syncope, weakness, light-headedness, numbness and headaches.  Hematological: Denies adenopathy. Easy bruising, personal or family bleeding history  Psychiatric/Behavioral: Denies suicidal ideation, mood changes, confusion, nervousness, sleep disturbance and agitation    Physical Exam: Vitals:   03/30/20 1132  BP: 140/80  Pulse: 65  Temp: 98.3 F (36.8 C)  TempSrc: Oral  SpO2: 97%  Weight: 187 lb 11.2 oz (85.1 kg)    Body mass index is 26.55 kg/m.   Constitutional: NAD, calm, comfortable Eyes: PERRL, lids and conjunctivae normal ENMT: Mucous membranes are moist. Respiratory: clear to auscultation bilaterally, no wheezing, no crackles. Normal respiratory effort. No accessory muscle use.  Cardiovascular: Regular rate and rhythm, no murmurs / rubs / gallops. No extremity edema.  Neurologic: Grossly intact and nonfocal Psychiatric: Normal judgment and insight. Alert and oriented x 3. Normal mood.    Impression and Plan:  Vitamin D deficiency  Impaired glucose tolerance -Last A1c was 6.0 in September 2021.  Tobacco use disorder -He has been able to decrease from 1 to half pack a day. -He is using nicotine patches as well as Wellbutrin.  Stage 3b chronic kidney disease ([place]) -He hovers between stage II and stage III chronic kidney disease, plans on observation for now. -He has been advised on NSAID use.  Essential hypertension -Uncontrolled. -Continue maximum doses of labetalol and amlodipine. -Increase hydralazine from 50 to 75 mg 3 times a  day. -He will do ambulatory measurements and return in 6 weeks for follow-up.  Polycythemia -Seen by hematology, he is not taking aspirin 81 mg a day, he has been recommended blood donation as him method of management.  Dyslipidemia -Total cholesterol was elevated to 238, he is on Lipitor 40 mg daily.    Patient Instructions  -Nice seeing you today!!  -Increase hydralazine to 75 mg (1 and a half tablet three times a day).  -Schedule follow up in 8 weeks.         Lelon Frohlich, MD [facility]

## 2020-03-30 NOTE — Patient Instructions (Signed)
-  Nice seeing you today!!  -Increase hydralazine to 75 mg (1 and a half tablet three times a day).  -Schedule follow up in 8 weeks.

## 2020-05-27 ENCOUNTER — Encounter: Payer: Self-pay | Admitting: Internal Medicine

## 2020-05-27 ENCOUNTER — Ambulatory Visit: Payer: BLUE CROSS/BLUE SHIELD | Admitting: Internal Medicine

## 2020-05-27 ENCOUNTER — Other Ambulatory Visit: Payer: Self-pay

## 2020-05-27 VITALS — BP 140/80 | HR 75 | Temp 97.7°F | Wt 192.6 lb

## 2020-05-27 DIAGNOSIS — K219 Gastro-esophageal reflux disease without esophagitis: Secondary | ICD-10-CM

## 2020-05-27 DIAGNOSIS — E785 Hyperlipidemia, unspecified: Secondary | ICD-10-CM

## 2020-05-27 DIAGNOSIS — N1832 Chronic kidney disease, stage 3b: Secondary | ICD-10-CM

## 2020-05-27 DIAGNOSIS — R739 Hyperglycemia, unspecified: Secondary | ICD-10-CM | POA: Diagnosis not present

## 2020-05-27 DIAGNOSIS — I10 Essential (primary) hypertension: Secondary | ICD-10-CM

## 2020-05-27 DIAGNOSIS — E559 Vitamin D deficiency, unspecified: Secondary | ICD-10-CM

## 2020-05-27 DIAGNOSIS — R7989 Other specified abnormal findings of blood chemistry: Secondary | ICD-10-CM

## 2020-05-27 DIAGNOSIS — R7302 Impaired glucose tolerance (oral): Secondary | ICD-10-CM

## 2020-05-27 DIAGNOSIS — F17200 Nicotine dependence, unspecified, uncomplicated: Secondary | ICD-10-CM

## 2020-05-27 LAB — T3, FREE: T3, Free: 3.4 pg/mL (ref 2.3–4.2)

## 2020-05-27 LAB — POCT GLYCOSYLATED HEMOGLOBIN (HGB A1C): Hemoglobin A1C: 6.2 % — AB (ref 4.0–5.6)

## 2020-05-27 LAB — TSH: TSH: 3.64 u[IU]/mL (ref 0.35–4.50)

## 2020-05-27 LAB — T4, FREE: Free T4: 0.76 ng/dL (ref 0.60–1.60)

## 2020-05-27 LAB — VITAMIN D 25 HYDROXY (VIT D DEFICIENCY, FRACTURES): VITD: 26.21 ng/mL — ABNORMAL LOW (ref 30.00–100.00)

## 2020-05-27 NOTE — Progress Notes (Signed)
Established Patient Office Visit     This visit occurred during the SARS-CoV-2 public health emergency.  Safety protocols were in place, including screening questions prior to the visit, additional usage of staff PPE, and extensive cleaning of exam room while observing appropriate contact time as indicated for disinfecting solutions.    CC/Reason for Visit: follow-up chronic medical conditions  HPI: Jacob Arnold is a 60 y.o. male who is coming in today for the above mentioned reasons. Past Medical History is significant for: Impaired glucose tolerance, uncontrolled hypertension, hyperlipidemia, polycythemia, ongoing tobacco abuse as well as chronic kidney disease stage II-III.  He was also recently diagnosed with vitamin D deficiency.  He has a history of polycythemia who has seen hematology and is thought likely due to tobacco use.  He has decreased his smoking from a pack a day to a pack a week.  He is having difficulty remembering his midday dose of hydralazine.  Other than this he has been doing well.   Past Medical/Surgical History: Past Medical History:  Diagnosis Date  . ED (erectile dysfunction)   . HYPERLIPIDEMIA   . HYPERTENSION   . Panic attacks   . Tobacco abuse     Past Surgical History:  Procedure Laterality Date  . TONSILLECTOMY    . VASECTOMY      Social History:  reports that he has been smoking cigarettes. He has a 29.00 pack-year smoking history. He has never used smokeless tobacco. He reports current alcohol use of about 6.0 standard drinks of alcohol per week. He reports that he does not use drugs.  Allergies: Allergies  Allergen Reactions  . Diphenhydramine Hcl Itching and Swelling    Family History:  Family History  Problem Relation Age of Onset  . Breast cancer Mother   . Liver cancer Mother   . Diabetes Father   . Hyperlipidemia Neg Hx        family hx  . Heart disease Neg Hx        family hx  . Hypertension Neg Hx        family  hx  . Cancer Neg Hx        family hx     Current Outpatient Medications:  .  allopurinol (ZYLOPRIM) 100 MG tablet, Take 2 tablets (200 mg total) by mouth daily., Disp: 180 tablet, Rfl: 1 .  amLODipine (NORVASC) 10 MG tablet, Take 1 tablet (10 mg total) by mouth daily., Disp: 90 tablet, Rfl: 1 .  aspirin 81 MG tablet, Take 1 tablet (81 mg total) by mouth daily., Disp: 30 tablet, Rfl:  .  atorvastatin (LIPITOR) 40 MG tablet, Take 1 tablet (40 mg total) by mouth daily., Disp: 90 tablet, Rfl: 0 .  buPROPion (WELLBUTRIN XL) 150 MG 24 hr tablet, Take 1 tablet (150 mg total) by mouth daily., Disp: 90 tablet, Rfl: 1 .  hydrALAZINE (APRESOLINE) 50 MG tablet, Take 1.5 tablets (75 mg total) by mouth 3 (three) times daily., Disp: 270 tablet, Rfl: 1 .  labetalol (NORMODYNE) 200 MG tablet, Take 2 tablets (400 mg total) by mouth 2 (two) times daily., Disp: 180 tablet, Rfl: 1 .  Vitamin D, Ergocalciferol, (DRISDOL) 1.25 MG (50000 UNIT) CAPS capsule, Take 1 capsule (50,000 Units total) by mouth every 7 (seven) days for 12 doses. (Patient not taking: Reported on 05/27/2020), Disp: 12 capsule, Rfl: 0  Review of Systems:  Constitutional: Denies fever, chills, diaphoresis, appetite change and fatigue.  HEENT: Denies photophobia, eye pain, redness,  hearing loss, ear pain, congestion, sore throat, rhinorrhea, sneezing, mouth sores, trouble swallowing, neck pain, neck stiffness and tinnitus.   Respiratory: Denies SOB, DOE, cough, chest tightness,  and wheezing.   Cardiovascular: Denies chest pain, palpitations and leg swelling.  Gastrointestinal: Denies nausea, vomiting, abdominal pain, diarrhea, constipation, blood in stool and abdominal distention.  Genitourinary: Denies dysuria, urgency, frequency, hematuria, flank pain and difficulty urinating.  Endocrine: Denies: hot or cold intolerance, sweats, changes in hair or nails, polyuria, polydipsia. Musculoskeletal: Denies myalgias, back pain, joint swelling,  arthralgias and gait problem.  Skin: Denies pallor, rash and wound.  Neurological: Denies dizziness, seizures, syncope, weakness, light-headedness, numbness and headaches.  Hematological: Denies adenopathy. Easy bruising, personal or family bleeding history  Psychiatric/Behavioral: Denies suicidal ideation, mood changes, confusion, nervousness, sleep disturbance and agitation    Physical Exam: Vitals:   05/27/20 1028  BP: 140/80  Pulse: 75  Temp: 97.7 F (36.5 C)  TempSrc: Oral  SpO2: 96%  Weight: 192 lb 9.6 oz (87.4 kg)    Body mass index is 27.24 kg/m.   Constitutional: NAD, calm, comfortable Eyes: PERRL, lids and conjunctivae normal ENMT: Mucous membranes are moist.  Respiratory: clear to auscultation bilaterally, no wheezing, no crackles. Normal respiratory effort. No accessory muscle use.  Cardiovascular: Regular rate and rhythm, no murmurs / rubs / gallops. No extremity edema.   Neurologic: Grossly intact and nonfocal. Psychiatric: Normal judgment and insight. Alert and oriented x 3. Normal mood.    Impression and Plan:  Vitamin D deficiency  - Plan: VITAMIN D 25 Hydroxy (Vit-D Deficiency, Fractures)  Elevated TSH  - Plan: T3, Free, T4, Free, TSH  Impaired glucose tolerance -A1c continues to creep up and is 6.2 today, continue to follow.  Gastroesophageal reflux disease without esophagitis -Well-controlled, not on daily medication.  Tobacco use disorder -He has significantly decreased his smoking from a pack a day to a pack a week, continue Wellbutrin.  Stage 3b chronic kidney disease ([facility]) -Baseline creatinine is around 1.1-1.2.  Essential hypertension -He is currently on labetalol 400 mg twice daily, amlodipine 10 mg daily and hydralazine 75 mg 3 times daily.  He is having difficulty frequently remembering his midday dose.  Since blood pressure is almost at goal have instructed him to set an alarm on his phone to improve adherence issues before we add  another medication.  Dyslipidemia -Last LDL was elevated in September, he was started on atorvastatin 40 mg daily, recheck lipids in 6 months since last check.      Lelon Frohlich, MD [facility]

## 2020-05-28 ENCOUNTER — Other Ambulatory Visit: Payer: Self-pay | Admitting: Internal Medicine

## 2020-05-28 DIAGNOSIS — E559 Vitamin D deficiency, unspecified: Secondary | ICD-10-CM

## 2020-05-28 DIAGNOSIS — R7989 Other specified abnormal findings of blood chemistry: Secondary | ICD-10-CM

## 2020-05-28 MED ORDER — VITAMIN D (ERGOCALCIFEROL) 1.25 MG (50000 UNIT) PO CAPS: 50000.0000 [IU] | ORAL_CAPSULE | ORAL | 0 refills | Status: AC

## 2020-06-30 ENCOUNTER — Other Ambulatory Visit: Payer: Self-pay | Admitting: *Deleted

## 2020-06-30 DIAGNOSIS — I10 Essential (primary) hypertension: Secondary | ICD-10-CM

## 2020-06-30 DIAGNOSIS — F17200 Nicotine dependence, unspecified, uncomplicated: Secondary | ICD-10-CM

## 2020-06-30 DIAGNOSIS — E785 Hyperlipidemia, unspecified: Secondary | ICD-10-CM

## 2020-06-30 MED ORDER — AMLODIPINE BESYLATE 10 MG PO TABS: 10.0000 mg | ORAL_TABLET | Freq: Every day | ORAL | 1 refills | Status: DC

## 2020-06-30 MED ORDER — HYDRALAZINE HCL 50 MG PO TABS: 75.0000 mg | ORAL_TABLET | Freq: Three times a day (TID) | ORAL | 1 refills | Status: DC

## 2020-06-30 MED ORDER — BUPROPION HCL ER (XL) 150 MG PO TB24: 150.0000 mg | ORAL_TABLET | Freq: Every day | ORAL | 1 refills | Status: DC

## 2020-06-30 MED ORDER — ATORVASTATIN CALCIUM 40 MG PO TABS: 40.0000 mg | ORAL_TABLET | Freq: Every day | ORAL | 1 refills | Status: DC

## 2020-06-30 MED ORDER — LABETALOL HCL 200 MG PO TABS: 400.0000 mg | ORAL_TABLET | Freq: Two times a day (BID) | ORAL | 1 refills | Status: DC

## 2020-09-05 ENCOUNTER — Other Ambulatory Visit: Payer: Self-pay | Admitting: Internal Medicine

## 2020-09-05 DIAGNOSIS — I10 Essential (primary) hypertension: Secondary | ICD-10-CM

## 2021-01-28 ENCOUNTER — Encounter: Payer: Self-pay | Admitting: Internal Medicine

## 2021-01-28 ENCOUNTER — Ambulatory Visit: Payer: 59 | Admitting: Internal Medicine

## 2021-01-28 ENCOUNTER — Other Ambulatory Visit: Payer: Self-pay | Admitting: Internal Medicine

## 2021-01-28 ENCOUNTER — Other Ambulatory Visit: Payer: Self-pay

## 2021-01-28 VITALS — BP 140/80 | HR 66 | Temp 97.6°F | Wt 202.9 lb

## 2021-01-28 DIAGNOSIS — R011 Cardiac murmur, unspecified: Secondary | ICD-10-CM | POA: Diagnosis not present

## 2021-01-28 DIAGNOSIS — E785 Hyperlipidemia, unspecified: Secondary | ICD-10-CM

## 2021-01-28 DIAGNOSIS — F17200 Nicotine dependence, unspecified, uncomplicated: Secondary | ICD-10-CM

## 2021-01-28 DIAGNOSIS — I10 Essential (primary) hypertension: Secondary | ICD-10-CM

## 2021-01-28 DIAGNOSIS — R7989 Other specified abnormal findings of blood chemistry: Secondary | ICD-10-CM | POA: Diagnosis not present

## 2021-01-28 DIAGNOSIS — E559 Vitamin D deficiency, unspecified: Secondary | ICD-10-CM | POA: Diagnosis not present

## 2021-01-28 DIAGNOSIS — R739 Hyperglycemia, unspecified: Secondary | ICD-10-CM

## 2021-01-28 DIAGNOSIS — N1832 Chronic kidney disease, stage 3b: Secondary | ICD-10-CM

## 2021-01-28 DIAGNOSIS — I739 Peripheral vascular disease, unspecified: Secondary | ICD-10-CM

## 2021-01-28 DIAGNOSIS — E1151 Type 2 diabetes mellitus with diabetic peripheral angiopathy without gangrene: Secondary | ICD-10-CM

## 2021-01-28 LAB — COMPREHENSIVE METABOLIC PANEL
ALT: 33 U/L (ref 0–53)
AST: 22 U/L (ref 0–37)
Albumin: 4.1 g/dL (ref 3.5–5.2)
Alkaline Phosphatase: 96 U/L (ref 39–117)
BUN: 17 mg/dL (ref 6–23)
CO2: 26 mEq/L (ref 19–32)
Calcium: 9.2 mg/dL (ref 8.4–10.5)
Chloride: 105 mEq/L (ref 96–112)
Creatinine, Ser: 1.4 mg/dL (ref 0.40–1.50)
GFR: 55.16 mL/min — ABNORMAL LOW (ref >60.00)
Glucose, Bld: 174 mg/dL — ABNORMAL HIGH (ref 70–99)
Potassium: 3.7 mEq/L (ref 3.5–5.1)
Sodium: 140 mEq/L (ref 135–145)
Total Bilirubin: 0.6 mg/dL (ref 0.2–1.2)
Total Protein: 6.9 g/dL (ref 6.0–8.3)

## 2021-01-28 LAB — CBC WITH DIFFERENTIAL/PLATELET
Basophils Absolute: 0 10*3/uL (ref 0.0–0.1)
Basophils Relative: 0.4 % (ref 0.0–3.0)
Eosinophils Absolute: 0.3 10*3/uL (ref 0.0–0.7)
Eosinophils Relative: 3.5 % (ref 0.0–5.0)
HCT: 46.4 % (ref 39.0–52.0)
Hemoglobin: 15.7 g/dL (ref 13.0–17.0)
Lymphocytes Relative: 33.4 % (ref 12.0–46.0)
Lymphs Abs: 3.1 10*3/uL (ref 0.7–4.0)
MCHC: 33.9 g/dL (ref 30.0–36.0)
MCV: 93.8 fl (ref 78.0–100.0)
Monocytes Absolute: 0.7 10*3/uL (ref 0.1–1.0)
Monocytes Relative: 7.8 % (ref 3.0–12.0)
Neutro Abs: 5 10*3/uL (ref 1.4–7.7)
Neutrophils Relative %: 54.9 % (ref 43.0–77.0)
Platelets: 213 10*3/uL (ref 150.0–400.0)
RBC: 4.94 Mil/uL (ref 4.22–5.81)
RDW: 13.3 % (ref 11.5–15.5)
WBC: 9.2 10*3/uL (ref 4.0–10.5)

## 2021-01-28 LAB — LIPID PANEL
Cholesterol: 171 mg/dL (ref 0–200)
HDL: 37.7 mg/dL — ABNORMAL LOW (ref >39.00)
NonHDL: 132.86
Total CHOL/HDL Ratio: 5
Triglycerides: 348 mg/dL — ABNORMAL HIGH (ref 0.0–149.0)
VLDL: 69.6 mg/dL — ABNORMAL HIGH (ref 0.0–40.0)

## 2021-01-28 LAB — POCT GLYCOSYLATED HEMOGLOBIN (HGB A1C): Hemoglobin A1C: 6.7 % — AB (ref 4.0–5.6)

## 2021-01-28 LAB — LDL CHOLESTEROL, DIRECT: Direct LDL: 73 mg/dL

## 2021-01-28 LAB — MICROALBUMIN / CREATININE URINE RATIO
Creatinine,U: 221.7 mg/dL
Microalb Creat Ratio: 150.4 mg/g — ABNORMAL HIGH (ref 0.0–30.0)
Microalb, Ur: 333.5 mg/dL — ABNORMAL HIGH (ref 0.0–1.9)

## 2021-01-28 LAB — VITAMIN D 25 HYDROXY (VIT D DEFICIENCY, FRACTURES): VITD: 19.08 ng/mL — ABNORMAL LOW (ref 30.00–100.00)

## 2021-01-28 LAB — TSH: TSH: 3.94 u[IU]/mL (ref 0.35–5.50)

## 2021-01-28 MED ORDER — VITAMIN D (ERGOCALCIFEROL) 1.25 MG (50000 UNIT) PO CAPS: 50000.0000 [IU] | ORAL_CAPSULE | ORAL | 0 refills | Status: AC

## 2021-01-28 MED ORDER — LISINOPRIL 10 MG PO TABS: 10.0000 mg | ORAL_TABLET | Freq: Every day | ORAL | 1 refills | Status: DC

## 2021-01-28 MED ORDER — ATORVASTATIN CALCIUM 80 MG PO TABS: 80.0000 mg | ORAL_TABLET | Freq: Every day | ORAL | 1 refills | Status: DC

## 2021-01-28 NOTE — Patient Instructions (Signed)
-  Nice seeing you today!!  -Lab work today; will notify you once results are available.  -Start lisinopril 10 mg daily.  -Ultrasounds of heart and legs will be ordered.  -Referral to vascular surgery and diabetes education.  -Schedule follow up in 3 months.

## 2021-01-28 NOTE — Progress Notes (Signed)
Established Patient Office Visit     This visit occurred during the SARS-CoV-2 public health emergency.  Safety protocols were in place, including screening questions prior to the visit, additional usage of staff PPE, and extensive cleaning of exam room while observing appropriate contact time as indicated for disinfecting solutions.    CC/Reason for Visit: 6-month follow-up chronic conditions  HPI: [name] is a 60 y.o. male who is coming in today for the above mentioned reasons. Past Medical History is significant for: Impaired glucose tolerance, hypertension, hyperlipidemia, chronic kidney disease stage II-III and tobacco abuse.  Fortunately he quit smoking over the summer, he is no longer taking Wellbutrin.  He has gained a little bit of weight while in the process of quitting smoking.  He is concerned because his blood pressure remains elevated.  He has also been having significant bilateral calf pain with walking and improves with rest.  [place]  Unfortunately his A1c today has risen to 6.7 which now puts him in the diabetic range.   Past Medical/Surgical History: Past Medical History:  Diagnosis Date   ED (erectile dysfunction)    HYPERLIPIDEMIA    HYPERTENSION    Panic attacks    Tobacco abuse     Past Surgical History:  Procedure Laterality Date   TONSILLECTOMY     VASECTOMY      Social History:  reports that he has quit smoking. His smoking use included cigarettes. He has a 29.00 pack-year smoking history. He has never used smokeless tobacco. He reports current alcohol use of about 6.0 standard drinks per week. He reports that he does not use drugs.  Allergies: Allergies  Allergen Reactions   Diphenhydramine Hcl Itching and Swelling    Family History:  Family History  Problem Relation Age of Onset   Breast cancer Mother    Liver cancer Mother    Diabetes Father    Hyperlipidemia Neg Hx        family hx   Heart  disease Neg Hx        family hx   Hypertension Neg Hx        family hx   Cancer Neg Hx        family hx     Current Outpatient Medications:    allopurinol (ZYLOPRIM) 100 MG tablet, Take 2 tablets (200 mg total) by mouth daily., Disp: 180 tablet, Rfl: 1   amLODipine (NORVASC) 10 MG tablet, Take 1 tablet (10 mg total) by mouth daily., Disp: 90 tablet, Rfl: 1   aspirin 81 MG tablet, Take 1 tablet (81 mg total) by mouth daily., Disp: 30 tablet, Rfl:    atorvastatin (LIPITOR) 40 MG tablet, Take 1 tablet (40 mg total) by mouth daily., Disp: 90 tablet, Rfl: 1   buPROPion (WELLBUTRIN XL) 150 MG 24 hr tablet, Take 1 tablet (150 mg total) by mouth daily., Disp: 90 tablet, Rfl: 1   hydrALAZINE (APRESOLINE) 50 MG tablet, TAKE 1 AND 1/2 TABLETS BY  MOUTH 3 TIMES DAILY, Disp: 405 tablet, Rfl: 1   labetalol (NORMODYNE) 200 MG tablet, TAKE 2 TABLETS BY MOUTH  TWICE DAILY, Disp: 360 tablet, Rfl: 1   lisinopril (ZESTRIL) 10 MG tablet, Take 1 tablet (10 mg total) by mouth daily., Disp: 90 tablet, Rfl: 1  Review of Systems:  Constitutional: Denies fever, chills, diaphoresis, appetite change and fatigue.  HEENT: Denies photophobia, eye pain, redness, hearing loss, ear pain, congestion, sore throat, rhinorrhea, sneezing,  mouth sores, trouble swallowing, neck pain, neck stiffness and tinnitus.   Respiratory: Denies SOB, DOE, cough, chest tightness,  and wheezing.   Cardiovascular: Denies chest pain, palpitations and leg swelling.  Gastrointestinal: Denies nausea, vomiting, abdominal pain, diarrhea, constipation, blood in stool and abdominal distention.  Genitourinary: Denies dysuria, urgency, frequency, hematuria, flank pain and difficulty urinating.  Endocrine: Denies: hot or cold intolerance, sweats, changes in hair or nails, polyuria, polydipsia. Musculoskeletal: Denies  back pain, joint swelling, arthralgias and gait problem.  Skin: Denies pallor, rash and wound.  Neurological: Denies dizziness, seizures,  syncope, weakness, light-headedness, numbness and headaches.  Hematological: Denies adenopathy. Easy bruising, personal or family bleeding history  Psychiatric/Behavioral: Denies suicidal ideation, mood changes, confusion, nervousness, sleep disturbance and agitation    Physical Exam: Vitals:   01/28/21 1028  BP: 140/80  Pulse: 66  Temp: 97.6 F (36.4 C)  TempSrc: Oral  SpO2: 96%  Weight: 202 lb 14.4 oz (92 kg)    Body mass index is 28.7 kg/m.   Constitutional: NAD, calm, comfortable Eyes: PERRL, lids and conjunctivae normal ENMT: Mucous membranes are moist.  Neck: normal, supple, no masses, no thyromegaly Respiratory: clear to auscultation bilaterally, no wheezing, no crackles. Normal respiratory effort. No accessory muscle use.  Cardiovascular: Regular rate and rhythm, positive systolic ejection murmur best heard at the right upper sternal border, no rubs / gallops. No extremity edema. 2+ pedal pulses. No carotid bruits.  Neurologic: Grossly intact and nonfocal Psychiatric: Normal judgment and insight. Alert and oriented x 3. Normal mood.    Impression and Plan:  Type 2 diabetes mellitus with diabetic peripheral angiopathy without gangrene, without long-term current use of insulin (Jacob Arnold)  - Plan: POCT glycosylated hemoglobin (Hb A1C), Microalbumin / creatinine urine ratio, Ambulatory referral to diabetic education -He is newly diagnosed as a diabetic today with an A1c of 6.7, it had been 6.2 in February.   -He will be referred to diabetic education, since his A1c is already at goal we will withhold from starting medication today.  Systolic murmur  - Plan: ECHOCARDIOGRAM COMPLETE  Intermittent claudication (HCC)  - Plan: CBC with Differential/Platelet, Comprehensive metabolic panel, Lipid panel, VAS [place] ABI WITH/WO TBI, Ambulatory referral to Vascular Surgery -Presentation sounds mostly like intermittent claudication, I will check ABIs and refer to vascular surgery.  He is  high risk for PAD given his hyperlipidemia, hypertension, type 2 diabetes and smoking.  Tobacco use disorder -He fully quit smoking over the summer, he has been congratulated on his success.  Essential hypertension  - Plan: lisinopril (ZESTRIL) 10 MG tablet -Blood pressure remains uncontrolled on amlodipine 10 mg daily, labetalol 200 mg twice daily and hydralazine 50 mg 3 times a day. -Given new onset diabetes we will go ahead and add lisinopril 10 mg daily, monitor renal function closely given CKD stage II.  Stage 3b chronic kidney disease ([place]) -Last measured creatinine was 1.180 in September 2021.  Dyslipidemia -Goal LDL less than 70, he is currently on atorvastatin 40 mg daily.  Check lipids today.  Time spent: 34 minutes reviewing chart, interviewing and examining patient and formulating plan of care.   Patient Instructions  -Nice seeing you today!!  -Lab work today; will notify you once results are available.  -Start lisinopril 10 mg daily.  -Ultrasounds of heart and legs will be ordered.  -Referral to vascular surgery and diabetes education.  -Schedule follow up in 3 months.    Lelon Frohlich, MD [facility]

## 2021-01-31 ENCOUNTER — Other Ambulatory Visit: Payer: Self-pay | Admitting: Internal Medicine

## 2021-01-31 DIAGNOSIS — I10 Essential (primary) hypertension: Secondary | ICD-10-CM

## 2021-01-31 DIAGNOSIS — E785 Hyperlipidemia, unspecified: Secondary | ICD-10-CM

## 2021-02-04 ENCOUNTER — Encounter: Payer: Self-pay | Admitting: Internal Medicine

## 2021-02-04 DIAGNOSIS — E785 Hyperlipidemia, unspecified: Secondary | ICD-10-CM

## 2021-02-08 MED ORDER — ATORVASTATIN CALCIUM 80 MG PO TABS
80.0000 mg | ORAL_TABLET | Freq: Every day | ORAL | 1 refills | Status: DC
Start: 2021-02-08 — End: 2021-11-17

## 2021-02-28 ENCOUNTER — Ambulatory Visit (HOSPITAL_COMMUNITY)
Admission: RE | Admit: 2021-02-28 | Discharge: 2021-02-28 | Disposition: A | Discharge status: Home / Self Care | Payer: 59 | Source: Ambulatory Visit | Attending: Surgery | Admitting: Surgery

## 2021-02-28 ENCOUNTER — Encounter: Payer: Self-pay | Admitting: Surgery

## 2021-02-28 ENCOUNTER — Ambulatory Visit: Payer: 59 | Admitting: Surgery

## 2021-02-28 ENCOUNTER — Other Ambulatory Visit: Payer: Self-pay

## 2021-02-28 VITALS — BP 152/81 | HR 58 | Temp 98.5°F | Resp 20 | Ht 70.5 in | Wt 200.0 lb

## 2021-02-28 DIAGNOSIS — I70213 Atherosclerosis of native arteries of extremities with intermittent claudication, bilateral legs: Secondary | ICD-10-CM

## 2021-02-28 DIAGNOSIS — I739 Peripheral vascular disease, unspecified: Secondary | ICD-10-CM | POA: Diagnosis present

## 2021-02-28 MED ORDER — CILOSTAZOL 100 MG PO TABS: 100.0000 mg | ORAL_TABLET | Freq: Two times a day (BID) | ORAL | 11 refills | Status: DC

## 2021-02-28 NOTE — Progress Notes (Signed)
[facility] [place]  Patient name: Jacob Arnold MRN: [id number] DOB: 08-07-1960 Sex: male   REQUESTING PROVIDER:    Dr. Deniece Ree   REASON FOR CONSULT:    claudication  HISTORY OF PRESENT ILLNESS:   Jacob Arnold is a 60 y.o. male, who is referred for evaluation of claudication.  The patient states that he has been having symptoms for approximately 1 year.  He gets bilateral calf cramping at approximately 100 yards.  Both legs bother him equally.  He denies rest pain or nonhealing wounds.  The patient is a former smoker having quit 1 year ago.  He is on high-dose statin therapy for hypercholesterolemia.  His blood pressure is managed with an ACE inhibitor.  He has been diagnosed as a borderline diabetic.  He was previously hospitalized for several days for pericarditis many years ago.  PAST MEDICAL HISTORY    Past Medical History:  Diagnosis Date   ED (erectile dysfunction)    HYPERLIPIDEMIA    HYPERTENSION    Panic attacks    Tobacco abuse      FAMILY HISTORY   Family History  Problem Relation Age of Onset   Breast cancer Mother    Liver cancer Mother    Diabetes Father    Hyperlipidemia Neg Hx        family hx   Heart disease Neg Hx        family hx   Hypertension Neg Hx        family hx   Cancer Neg Hx        family hx    SOCIAL HISTORY:   Social History   Socioeconomic History   Marital status: Married    Spouse name: Not on file   Number of children: Not on file   Years of education: Not on file   Highest education level: Not on file  Occupational History   Not on file  Tobacco Use   Smoking status: Former    Packs/day: 1.00    Years: 29.00    Pack years: 29.00    Types: Cigarettes   Smokeless tobacco: Never   Tobacco comments:    pt down to 15 cigarettes a day  Vaping Use   Vaping Use: Every day  Substance and Sexual Activity   Alcohol use: Yes    Alcohol/week: 6.0 standard  drinks    Types: 6 Cans of beer per week   Drug use: No   Sexual activity: Not on file  Other Topics Concern   Not on file  Social History Narrative   Not on file   Social Determinants of Health   Financial Resource Strain: Not on file  Food Insecurity: Not on file  Transportation Needs: Not on file  Physical Activity: Not on file  Stress: Not on file  Social Connections: Not on file  Intimate Partner Violence: Not on file    ALLERGIES:    Allergies  Allergen Reactions   Diphenhydramine Hcl Itching and Swelling    CURRENT MEDICATIONS:    Current Outpatient Medications  Medication Sig Dispense Refill   allopurinol (ZYLOPRIM) 100 MG tablet Take 2 tablets (200 mg total) by mouth daily. 180 tablet 1   amLODipine (NORVASC) 10 MG tablet TAKE 1 TABLET BY MOUTH  DAILY 90 tablet 1   aspirin 81 MG tablet Take 1 tablet (81 mg total) by mouth daily. 30 tablet    atorvastatin (LIPITOR) 80 MG tablet Take 1 tablet (80 mg  total) by mouth daily. 90 tablet 1   buPROPion (WELLBUTRIN XL) 150 MG 24 hr tablet Take 1 tablet (150 mg total) by mouth daily. 90 tablet 1   hydrALAZINE (APRESOLINE) 50 MG tablet TAKE 1 AND 1/2 TABLETS BY  MOUTH 3 TIMES DAILY 405 tablet 1   labetalol (NORMODYNE) 200 MG tablet TAKE 2 TABLETS BY MOUTH  TWICE DAILY 360 tablet 1   lisinopril (ZESTRIL) 10 MG tablet Take 1 tablet (10 mg total) by mouth daily. 90 tablet 1   Vitamin D, Ergocalciferol, (DRISDOL) 1.25 MG (50000 UNIT) CAPS capsule Take 1 capsule (50,000 Units total) by mouth every 7 (seven) days for 12 doses. 12 capsule 0   No current facility-administered medications for this visit.    REVIEW OF SYSTEMS:   [X]  denotes positive finding, [ ]  denotes negative finding Cardiac  Comments:  Chest pain or chest pressure:    Shortness of breath upon exertion:    Short of breath when lying flat:    Irregular heart rhythm:        Vascular    Pain in calf, thigh, or hip brought on by ambulation: x   Pain in feet  at night that wakes you up from your sleep:     Blood clot in your veins:    Leg swelling:         Pulmonary    Oxygen at home:    Productive cough:     Wheezing:         Neurologic    Sudden weakness in arms or legs:     Sudden numbness in arms or legs:     Sudden onset of difficulty speaking or slurred speech:    Temporary loss of vision in one eye:     Problems with dizziness:         Gastrointestinal    Blood in stool:      Vomited blood:         Genitourinary    Burning when urinating:     Blood in urine:        Psychiatric    Major depression:         Hematologic    Bleeding problems:    Problems with blood clotting too easily:        Skin    Rashes or ulcers:        Constitutional    Fever or chills:     PHYSICAL EXAM:   There were no vitals filed for this visit.  GENERAL: The patient is a well-nourished male, in no acute distress. The vital signs are documented above. CARDIAC: There is a regular rate and rhythm.  VASCULAR: Nonpalpable pedal pulses.  1-2+ bilateral femoral pulses PULMONARY: Nonlabored respirations ABDOMEN: Soft and non-tender with normal pitched bowel sounds.  MUSCULOSKELETAL: There are no major deformities or cyanosis. NEUROLOGIC: No focal weakness or paresthesias are detected. SKIN: There are no ulcers or rashes noted. PSYCHIATRIC: The patient has a normal affect.  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  Fortunately he is already on high-dose statin therapy, and aspirin, and blood pressure medication.  In addition he has quit smoking approximately 1 year ago and already does routine exercise.  The only other thing I can add to his medical regimen is a trial of cilostazol to see if this helps with his claudication symptoms.  This was initiated today.  I will give him a 6-week trial.  And then have him return to see me.  If he is continuing to have issues or if the medication does not alleviate his symptoms, the neck step would be angiography   Annamarie Major, IV, MD, FACS [facility] [phone] Pager [phone]

## 2021-03-07 ENCOUNTER — Telehealth: Payer: Self-pay | Admitting: Internal Medicine

## 2021-03-07 NOTE — Telephone Encounter (Signed)
Patient called to return missed call from [place]. I let patient know that rachel was out of the office today and would be back in tomorrow    Good callback number is [phone]     Please advise

## 2021-03-09 NOTE — Telephone Encounter (Signed)
Spoke with patient and reviewed [place] result.

## 2021-03-21 ENCOUNTER — Encounter: Payer: 59 | Attending: Internal Medicine | Admitting: Registered"

## 2021-03-21 ENCOUNTER — Encounter: Payer: Self-pay | Admitting: Registered"

## 2021-03-21 ENCOUNTER — Other Ambulatory Visit: Payer: Self-pay

## 2021-03-21 DIAGNOSIS — E1151 Type 2 diabetes mellitus with diabetic peripheral angiopathy without gangrene: Secondary | ICD-10-CM | POA: Diagnosis present

## 2021-03-21 DIAGNOSIS — E089 Diabetes mellitus due to underlying condition without complications: Secondary | ICD-10-CM | POA: Insufficient documentation

## 2021-03-21 NOTE — Progress Notes (Signed)
Diabetes Self-Management Education  Visit Type: First/Initial  Appt. Start Time: 1555 Appt. End Time: 9518  03/21/2021  Mr. Jacob Arnold, identified by name and date of birth, is a 60 y.o. male with a diagnosis of Diabetes: Type 2.   ASSESSMENT  Height 5\' 11"  (1.803 m), weight 200 lb 4.8 oz (90.9 kg). Body mass index is 27.94 kg/m.  A1c 6.7% up from 6.2% in February  Pt states he can't walk more than 100 yards, but MD told him to push it so he walks 3-4x day walk to mailbox and back. Pt states it is painful.  Sleep: "not good" during the week 3-4 hours, 10 hours on Fri & Sat.  Pt states he works 2 jobs and gets minimal sleep.  Pt reports that he stopped his nighttime snacking on cookies when he hit 200 lbs. Pt states his dinners resembles MyPlate except might be closer to 1/3 pro 1/3 starch 1/3 vegetable. Pt states the problem is that he eats a lot at dinner time. Pt states he often skips lunch. Although patient has cut back on some carbs, he is planning a trip to [place] and is going to enjoy the food while there.  Accu-chek Guide Me Lot #[id number] Exp: 05/05/2022 CBG: 108 mg/dL    Diabetes Self-Management Education - 03/21/21 1601       Visit Information   Visit Type First/Initial      Initial Visit   Diabetes Type Type 2    Are you currently following a meal plan? No    Are you taking your medications as prescribed? Not on Medications    Date Diagnosed 2022      Health Coping   How would you rate your overall health? Fair      Psychosocial Assessment   Patient Belief/Attitude about Diabetes Other (comment)   have to deal with it like everything else   How often do you need to have someone help you when you read instructions, pamphlets, or other written materials from your doctor or pharmacy? 1 - Never    What is the last grade level you completed in school? college      Complications   Last HgB A1C per patient/outside source 6.7 %    How often do you check  your blood sugar? Not recommended by provider    Have you had a dilated eye exam in the past 12 months? Yes    Have you had a dental exam in the past 12 months? Yes    Are you checking your feet? No      Dietary Intake   Breakfast eats 3x/week cereal OR toast OR eggs    Snack (morning) none    Lunch left overs    Snack (afternoon) none    Dinner [place] and butternut squash soup    Snack (evening) seconds of dinner    Beverage(s) 1 pot of coffee with stevia or yellow packets, tea (no caffiene after 1), water, G2 or regular 24 oz gatorade, alcohol on occasion      Exercise   Exercise Type Light (walking / raking leaves)    How many days per week to you exercise? 7    How many minutes per day do you exercise? 20    Total minutes per week of exercise 140      Patient Education   Previous Diabetes Education No    Nutrition management  Role of diet in the treatment of diabetes and the relationship between the three  main macronutrients and blood glucose level;Reviewed blood glucose goals for pre and post meals and how to evaluate the patients' food intake on their blood glucose level.    Physical activity and exercise  Role of exercise on diabetes management, blood pressure control and cardiac health.    Monitoring Taught/evaluated SMBG meter.;Identified appropriate SMBG and/or A1C goals.;Purpose and frequency of SMBG.    Chronic complications Relationship between chronic complications and blood glucose control    Psychosocial adjustment Role of stress on diabetes      Individualized Goals (developed by patient)   Nutrition General guidelines for healthy choices and portions discussed    Physical Activity Exercise 3-5 times per week    Monitoring  test my blood glucose as discussed      Outcomes   Expected Outcomes Demonstrated interest in learning. Expect positive outcomes    Future DMSE 2 months    Program Status Completed             Individualized Plan for Diabetes  Self-Management Training:   Learning Objective:  Patient will have a greater understanding of diabetes self-management. Patient education plan is to attend individual and/or group sessions per assessed needs and concerns.    Patient Instructions  More egg breakfast instead of cereal Eat a balanced lunch Smaller dinner portions Continue avoiding late night snacking  If you are interested in how a meal is affecting blood sugar you can check 1-2 hours after eating.  Exercising: continue what your doctor has suggested Expected Outcomes:  Demonstrated interest in learning. Expect positive outcomes  Education material provided: ADA - How to Thrive: A Guide for Your Journey with Diabetes, A1C conversion sheet, and Carbohydrate counting sheet  If problems or questions, patient to contact team via:  Phone and MyChart  Future DSME appointment: 2 months

## 2021-03-21 NOTE — Patient Instructions (Addendum)
More egg breakfast instead of cereal Eat a balanced lunch Smaller dinner portions Continue avoiding late night snacking  If you are interested in how a meal is affecting blood sugar you can check 1-2 hours after eating.  Exercising: continue what your doctor has suggested

## 2021-04-04 ENCOUNTER — Encounter: Payer: Self-pay | Admitting: Surgery

## 2021-04-04 ENCOUNTER — Other Ambulatory Visit: Payer: Self-pay

## 2021-04-04 ENCOUNTER — Ambulatory Visit: Payer: 59 | Admitting: Surgery

## 2021-04-04 VITALS — BP 122/63 | HR 72 | Temp 98.3°F | Resp 20 | Ht 71.0 in | Wt 206.0 lb

## 2021-04-04 DIAGNOSIS — I70213 Atherosclerosis of native arteries of extremities with intermittent claudication, bilateral legs: Secondary | ICD-10-CM | POA: Diagnosis not present

## 2021-04-04 NOTE — Progress Notes (Signed)
[facility] [place]  Patient name: Jacob Arnold MRN: [id number] DOB: 04-Oct-1960 Sex: male   REASON FOR VISIT:    Follow up  HISOTRY OF PRESENT ILLNESS:    Jacob Arnold is a 60 y.o. male who I saw 6 weeks ago for evaluation of claudication.  He has been having symptoms for approximately 1 year.  His symptoms are bilateral calf cramping at approximately 100 yards.  Both bother him equally.  He does not have rest pain or nonhealing wounds.  I elected to place him on cilostazol to evaluate his symptoms.  He is back today for follow-up.  He reports that with the cilostazol, he is walking further.  He does not feel that he has significant lifestyle limiting symptoms on a daily basis at this time.  The patient is a former smoker having quit 1 year ago.  He is on high-dose statin therapy for hypercholesterolemia.  His blood pressure is managed with an ACE inhibitor.  He has been diagnosed as a borderline diabetic.  He was previously hospitalized for several days for pericarditis many years ago.  PAST MEDICAL HISTORY:   Past Medical History:  Diagnosis Date   ED (erectile dysfunction)    HYPERLIPIDEMIA    HYPERTENSION    Panic attacks    Tobacco abuse      FAMILY HISTORY:   Family History  Problem Relation Age of Onset   Breast cancer Mother    Liver cancer Mother    Diabetes Father    Hyperlipidemia Neg Hx        family hx   Heart disease Neg Hx        family hx   Hypertension Neg Hx        family hx   Cancer Neg Hx        family hx    SOCIAL HISTORY:   Social History   Tobacco Use   Smoking status: Former    Packs/day: 1.00    Years: 29.00    Pack years: 29.00    Types: Cigarettes   Smokeless tobacco: Never   Tobacco comments:    pt down to 15 cigarettes a day  Substance Use Topics   Alcohol use: Yes    Alcohol/week: 6.0 standard drinks    Types: 6 Cans of beer per week     ALLERGIES:    Allergies  Allergen Reactions   Diphenhydramine Hcl Itching and Swelling     CURRENT MEDICATIONS:   Current Outpatient Medications  Medication Sig Dispense Refill   allopurinol (ZYLOPRIM) 100 MG tablet Take 2 tablets (200 mg total) by mouth daily. 180 tablet 1   amLODipine (NORVASC) 10 MG tablet TAKE 1 TABLET BY MOUTH  DAILY 90 tablet 1   aspirin 81 MG tablet Take 1 tablet (81 mg total) by mouth daily. 30 tablet    atorvastatin (LIPITOR) 80 MG tablet Take 1 tablet (80 mg total) by mouth daily. 90 tablet 1   cilostazol (PLETAL) 100 MG tablet Take 1 tablet (100 mg total) by mouth 2 (two) times daily before a meal. 60 tablet 11   hydrALAZINE (APRESOLINE) 50 MG tablet TAKE 1 AND 1/2 TABLETS BY  MOUTH 3 TIMES DAILY 405 tablet 1   labetalol (NORMODYNE) 200 MG tablet TAKE 2 TABLETS BY MOUTH  TWICE DAILY 360 tablet 1   lisinopril (ZESTRIL) 10 MG tablet Take 1 tablet (10 mg total) by mouth daily. 90 tablet 1   Vitamin D, Ergocalciferol, (DRISDOL)  1.25 MG (50000 UNIT) CAPS capsule Take 1 capsule (50,000 Units total) by mouth every 7 (seven) days for 12 doses. 12 capsule 0   No current facility-administered medications for this visit.    REVIEW OF SYSTEMS:   [X]  denotes positive finding, [ ]  denotes negative finding Cardiac  Comments:  Chest pain or chest pressure: Not   Shortness of breath upon exertion:    Short of breath when lying flat:    Irregular heart rhythm:        Vascular    Pain in calf, thigh, or hip brought on by ambulation: x   Pain in feet at night that wakes you up from your sleep:     Blood clot in your veins:    Leg swelling:         Pulmonary    Oxygen at home:    Productive cough:     Wheezing:         Neurologic    Sudden weakness in arms or legs:     Sudden numbness in arms or legs:     Sudden onset of difficulty speaking or slurred speech:    Temporary loss of vision in one eye:     Problems with dizziness:         Gastrointestinal    Blood in  stool:     Vomited blood:         Genitourinary    Burning when urinating:     Blood in urine:        Psychiatric    Major depression:         Hematologic    Bleeding problems:    Problems with blood clotting too easily:        Skin    Rashes or ulcers:        Constitutional    Fever or chills:      PHYSICAL EXAM:   Vitals:   04/04/21 0859  BP: 122/63  Pulse: 72  Resp: 20  Temp: 98.3 F (36.8 C)  SpO2: 94%  Weight: 206 lb (93.4 kg)  Height: 5\' 11"  (1.803 m)    GENERAL: The patient is a well-nourished male, in no acute distress. The vital signs are documented above. CARDIAC: There is a regular rate and rhythm.  VASCULAR: Nonpalpable pedal pulses PULMONARY: Non-labored respirations MUSCULOSKELETAL: There are no major deformities or cyanosis. NEUROLOGIC: No focal weakness or paresthesias are detected. SKIN: There are no ulcers or rashes noted. PSYCHIATRIC: The patient has a normal affect.  STUDIES:   I have again reviewed his duplex study with the following results: +-------+-----------+-----------+------------+------------+  ABI/TBIToday's ABIToday's TBIPrevious ABIPrevious TBI  +-------+-----------+-----------+------------+------------+  Right  0.62       0.48                                 +-------+-----------+-----------+------------+------------+  Left   0.76       0.64                                 +-------+-----------+-----------+------------+------------+   MEDICAL ISSUES:   Bilateral claudication: The patient is on optimal medical therapy.  He was started on cilostazol and has had an improvement in his symptoms.  We will not proceed with intervention at this time.  He will follow-up in 1 year with ABIs and symptom evaluation.  He knows  that if he has a change in symptoms or if he gets a wound or infection that does not heal, he will contact me before his 1 year follow-up.    Leia Alf, MD, FACS [facility] [phone] Pager [phone]

## 2021-04-07 ENCOUNTER — Telehealth: Payer: Self-pay | Admitting: Internal Medicine

## 2021-04-07 NOTE — Telephone Encounter (Signed)
Left voicemail for patient to call back to schedule an appointment for a physical because last cpe was in September of 2021.

## 2021-05-03 ENCOUNTER — Other Ambulatory Visit: Payer: Self-pay | Admitting: Internal Medicine

## 2021-05-03 DIAGNOSIS — I10 Essential (primary) hypertension: Secondary | ICD-10-CM

## 2021-07-27 ENCOUNTER — Encounter: Payer: 59 | Admitting: Internal Medicine

## 2021-08-01 ENCOUNTER — Other Ambulatory Visit: Payer: Self-pay | Admitting: Internal Medicine

## 2021-08-01 DIAGNOSIS — I10 Essential (primary) hypertension: Secondary | ICD-10-CM

## 2021-08-03 ENCOUNTER — Other Ambulatory Visit: Payer: Self-pay | Admitting: Internal Medicine

## 2021-08-03 DIAGNOSIS — I10 Essential (primary) hypertension: Secondary | ICD-10-CM

## 2021-08-10 ENCOUNTER — Other Ambulatory Visit: Payer: Self-pay | Admitting: Internal Medicine

## 2021-08-10 DIAGNOSIS — I10 Essential (primary) hypertension: Secondary | ICD-10-CM

## 2021-08-22 ENCOUNTER — Other Ambulatory Visit: Payer: Self-pay | Admitting: Internal Medicine

## 2021-08-22 DIAGNOSIS — I10 Essential (primary) hypertension: Secondary | ICD-10-CM

## 2021-09-01 ENCOUNTER — Encounter: Payer: Self-pay | Admitting: Internal Medicine

## 2021-09-01 ENCOUNTER — Ambulatory Visit (INDEPENDENT_AMBULATORY_CARE_PROVIDER_SITE_OTHER): Payer: BC Managed Care – PPO | Admitting: Internal Medicine

## 2021-09-01 VITALS — BP 110/70 | HR 65 | Temp 97.5°F | Ht 70.0 in | Wt 187.4 lb

## 2021-09-01 DIAGNOSIS — F17200 Nicotine dependence, unspecified, uncomplicated: Secondary | ICD-10-CM

## 2021-09-01 DIAGNOSIS — Z125 Encounter for screening for malignant neoplasm of prostate: Secondary | ICD-10-CM

## 2021-09-01 DIAGNOSIS — R7989 Other specified abnormal findings of blood chemistry: Secondary | ICD-10-CM

## 2021-09-01 DIAGNOSIS — E559 Vitamin D deficiency, unspecified: Secondary | ICD-10-CM

## 2021-09-01 DIAGNOSIS — I739 Peripheral vascular disease, unspecified: Secondary | ICD-10-CM

## 2021-09-01 DIAGNOSIS — E1151 Type 2 diabetes mellitus with diabetic peripheral angiopathy without gangrene: Secondary | ICD-10-CM | POA: Diagnosis not present

## 2021-09-01 DIAGNOSIS — E785 Hyperlipidemia, unspecified: Secondary | ICD-10-CM

## 2021-09-01 DIAGNOSIS — Z122 Encounter for screening for malignant neoplasm of respiratory organs: Secondary | ICD-10-CM | POA: Diagnosis not present

## 2021-09-01 DIAGNOSIS — Z0000 Encounter for general adult medical examination without abnormal findings: Secondary | ICD-10-CM

## 2021-09-01 DIAGNOSIS — N1832 Chronic kidney disease, stage 3b: Secondary | ICD-10-CM

## 2021-09-01 DIAGNOSIS — I10 Essential (primary) hypertension: Secondary | ICD-10-CM

## 2021-09-01 DIAGNOSIS — R011 Cardiac murmur, unspecified: Secondary | ICD-10-CM

## 2021-09-01 LAB — T3, FREE: T3, Free: 3 pg/mL (ref 2.3–4.2)

## 2021-09-01 LAB — CBC WITH DIFFERENTIAL/PLATELET
Basophils Absolute: 0.1 10*3/uL (ref 0.0–0.1)
Basophils Relative: 0.6 % (ref 0.0–3.0)
Eosinophils Absolute: 0.5 10*3/uL (ref 0.0–0.7)
Eosinophils Relative: 5.6 % — ABNORMAL HIGH (ref 0.0–5.0)
HCT: 42.8 % (ref 39.0–52.0)
Hemoglobin: 14.5 g/dL (ref 13.0–17.0)
Lymphocytes Relative: 30.7 % (ref 12.0–46.0)
Lymphs Abs: 2.8 10*3/uL (ref 0.7–4.0)
MCHC: 34 g/dL (ref 30.0–36.0)
MCV: 96 fl (ref 78.0–100.0)
Monocytes Absolute: 0.7 10*3/uL (ref 0.1–1.0)
Monocytes Relative: 7.8 % (ref 3.0–12.0)
Neutro Abs: 5.1 10*3/uL (ref 1.4–7.7)
Neutrophils Relative %: 55.3 % (ref 43.0–77.0)
Platelets: 216 10*3/uL (ref 150.0–400.0)
RBC: 4.46 Mil/uL (ref 4.22–5.81)
RDW: 14 % (ref 11.5–15.5)
WBC: 9.2 10*3/uL (ref 4.0–10.5)

## 2021-09-01 LAB — LIPID PANEL
Cholesterol: 145 mg/dL (ref 0–200)
HDL: 37.3 mg/dL — ABNORMAL LOW (ref >39.00)
LDL Cholesterol: 79 mg/dL (ref 0–99)
NonHDL: 107.99
Total CHOL/HDL Ratio: 4
Triglycerides: 143 mg/dL (ref 0.0–149.0)
VLDL: 28.6 mg/dL (ref 0.0–40.0)

## 2021-09-01 LAB — MICROALBUMIN / CREATININE URINE RATIO
Creatinine,U: 165.2 mg/dL
Microalb Creat Ratio: 40.9 mg/g — ABNORMAL HIGH (ref 0.0–30.0)
Microalb, Ur: 67.5 mg/dL — ABNORMAL HIGH (ref 0.0–1.9)

## 2021-09-01 LAB — COMPREHENSIVE METABOLIC PANEL
ALT: 25 U/L (ref 0–53)
AST: 16 U/L (ref 0–37)
Albumin: 4.4 g/dL (ref 3.5–5.2)
Alkaline Phosphatase: 98 U/L (ref 39–117)
BUN: 22 mg/dL (ref 6–23)
CO2: 25 mEq/L (ref 19–32)
Calcium: 9.4 mg/dL (ref 8.4–10.5)
Chloride: 106 mEq/L (ref 96–112)
Creatinine, Ser: 1.85 mg/dL — ABNORMAL HIGH (ref 0.40–1.50)
GFR: 39.31 mL/min — ABNORMAL LOW (ref >60.00)
Glucose, Bld: 134 mg/dL — ABNORMAL HIGH (ref 70–99)
Potassium: 4.3 mEq/L (ref 3.5–5.1)
Sodium: 139 mEq/L (ref 135–145)
Total Bilirubin: 0.6 mg/dL (ref 0.2–1.2)
Total Protein: 6.7 g/dL (ref 6.0–8.3)

## 2021-09-01 LAB — TSH: TSH: 3.58 u[IU]/mL (ref 0.35–5.50)

## 2021-09-01 LAB — PSA: PSA: 0.61 ng/mL (ref 0.10–4.00)

## 2021-09-01 LAB — T4, FREE: Free T4: 0.78 ng/dL (ref 0.60–1.60)

## 2021-09-01 LAB — HEMOGLOBIN A1C: Hgb A1c MFr Bld: 6 % (ref 4.6–6.5)

## 2021-09-01 LAB — VITAMIN D 25 HYDROXY (VIT D DEFICIENCY, FRACTURES): VITD: 25.91 ng/mL — ABNORMAL LOW (ref 30.00–100.00)

## 2021-09-01 NOTE — Patient Instructions (Signed)
-  Nice seeing you today!! ? ?-Lab work today; will notify you once results are available. ? ?-Remember your shingles and COVID vaccines. ? ?-Schedule follow up in 3-4 months. ? ? ?

## 2021-09-01 NOTE — Progress Notes (Signed)
? ? ? ?Established Patient Office Visit ? ? ? ? ?CC/Reason for Visit: Annual preventive exam ? ?HPI: Jacob Arnold is a 61 y.o. male who is coming in today for the above mentioned reasons. Past Medical History is significant for: Type 2 diabetes, hypertension, hyperlipidemia, stage II-III chronic kidney disease, tobacco use disorder but he quit smoking about a year ago.  Recently diagnosed PAD followed by vascular surgery on cilostazol.  He has no acute concerns or complaints.  He has routine eye and dental care.  He is overdue for shingles, flu, COVID vaccines.  His colonoscopy was in December 2013.  He has not had lung cancer screening. ? ? ?Past Medical/Surgical History: ?Past Medical History:  ?Diagnosis Date  ? ED (erectile dysfunction)   ? HYPERLIPIDEMIA   ? HYPERTENSION   ? Panic attacks   ? Tobacco abuse   ? ? ?Past Surgical History:  ?Procedure Laterality Date  ? TONSILLECTOMY    ? VASECTOMY    ? ? ?Social History: ? reports that he has quit smoking. His smoking use included cigarettes. He has a 29.00 pack-year smoking history. He has never used smokeless tobacco. He reports current alcohol use of about 6.0 standard drinks per week. He reports that he does not use drugs. ? ?Allergies: ?Allergies  ?Allergen Reactions  ? Diphenhydramine Hcl Itching and Swelling  ? ? ?Family History:  ?Family History  ?Problem Relation Age of Onset  ? Breast cancer Mother   ? Liver cancer Mother   ? Diabetes Father   ? Hyperlipidemia Neg Hx   ?     family hx  ? Heart disease Neg Hx   ?     family hx  ? Hypertension Neg Hx   ?     family hx  ? Cancer Neg Hx   ?     family hx  ? ? ? ?Current Outpatient Medications:  ?  amLODipine (NORVASC) 10 MG tablet, Take 1 tablet (10 mg total) by mouth daily., Disp: 90 tablet, Rfl: 1 ?  aspirin 81 MG tablet, Take 1 tablet (81 mg total) by mouth daily., Disp: 30 tablet, Rfl:  ?  atorvastatin (LIPITOR) 80 MG tablet, Take 1 tablet (80 mg total) by mouth daily., Disp: 90 tablet, Rfl: 1 ?   cilostazol (PLETAL) 100 MG tablet, Take 1 tablet (100 mg total) by mouth 2 (two) times daily before a meal., Disp: 60 tablet, Rfl: 11 ?  hydrALAZINE (APRESOLINE) 50 MG tablet, TAKE 1 AND 1/2 TABLETS BY  MOUTH 3 TIMES DAILY, Disp: 405 tablet, Rfl: 1 ?  labetalol (NORMODYNE) 200 MG tablet, TAKE 2 TABLETS BY MOUTH  TWICE DAILY, Disp: 360 tablet, Rfl: 1 ?  lisinopril (ZESTRIL) 10 MG tablet, TAKE 1 TABLET BY MOUTH EVERY DAY, Disp: 90 tablet, Rfl: 1 ? ?Review of Systems:  ?Constitutional: Denies fever, chills, diaphoresis, appetite change and fatigue.  ?HEENT: Denies photophobia, eye pain, redness, hearing loss, ear pain, congestion, sore throat, rhinorrhea, sneezing, mouth sores, trouble swallowing, neck pain, neck stiffness and tinnitus.   ?Respiratory: Denies SOB, DOE, cough, chest tightness,  and wheezing.   ?Cardiovascular: Denies chest pain, palpitations and leg swelling.  ?Gastrointestinal: Denies nausea, vomiting, abdominal pain, diarrhea, constipation, blood in stool and abdominal distention.  ?Genitourinary: Denies dysuria, urgency, frequency, hematuria, flank pain and difficulty urinating.  ?Endocrine: Denies: hot or cold intolerance, sweats, changes in hair or nails, polyuria, polydipsia. ?Musculoskeletal: Denies myalgias, back pain, joint swelling, arthralgias and gait problem.  ?Skin: Denies pallor, rash  and wound.  ?Neurological: Denies dizziness, seizures, syncope, weakness, light-headedness, numbness and headaches.  ?Hematological: Denies adenopathy. Easy bruising, personal or family bleeding history  ?Psychiatric/Behavioral: Denies suicidal ideation, mood changes, confusion, nervousness, sleep disturbance and agitation ? ? ? ?Physical Exam: ?Vitals:  ? 09/01/21 0819  ?BP: 110/70  ?Pulse: 65  ?Temp: (!) 97.5 ?F (36.4 ?C)  ?TempSrc: Oral  ?SpO2: 97%  ?Weight: 187 lb 6.4 oz (85 kg)  ?Height: '5\' 10"'$  (1.778 m)  ? ? ?Body mass index is 26.89 kg/m?. ? ? ?Constitutional: NAD, calm, comfortable ?Eyes: PERRL, lids  and conjunctivae normal ?ENMT: Mucous membranes are moist. Posterior pharynx clear of any exudate or lesions. Normal dentition. Tympanic membrane is pearly white, no erythema or bulging. ?Neck: normal, supple, no masses, no thyromegaly ?Respiratory: clear to auscultation bilaterally, no wheezing, no crackles. Normal respiratory effort. No accessory muscle use.  ?Cardiovascular: Regular rate and rhythm, systolic murmur is present, no rubs / gallops. No extremity edema. 2+ pedal pulses. No carotid bruits.  ?Abdomen: no tenderness, no masses palpated. No hepatosplenomegaly. Bowel sounds positive.  ?Musculoskeletal: no clubbing / cyanosis. No joint deformity upper and lower extremities. Good ROM, no contractures. Normal muscle tone.  ?Skin: no rashes, lesions, ulcers. No induration ?Neurologic: CN 2-12 grossly intact. Sensation intact, DTR normal. Strength 5/5 in all 4.  ?Psychiatric: Normal judgment and insight. Alert and oriented x 3. Normal mood.  ? ? ?Impression and Plan: ? ?Encounter for preventive health examination  ?-Recommend routine eye and dental care. ?-Immunizations: Advised flu, COVID, shingles vaccines but declines today despite counseling ?-Healthy lifestyle discussed in detail. ?-Labs to be updated today. ?-Colon cancer screening: 03/2012, will be due this December ?-Breast cancer screening: Not applicable ?-Cervical cancer screening: Not applicable ?-Lung cancer screening: Referred to lung cancer screening program ?-Prostate cancer screening: PSA ?-DEXA: Not applicable ? ?Type 2 diabetes mellitus with diabetic peripheral angiopathy without gangrene, without long-term current use of insulin ([place])  ?- Plan: CBC with Differential/Platelet, Comprehensive metabolic panel, Hemoglobin A1c, Microalbumin / creatinine urine ratio ? ?Vitamin D deficiency - Plan: VITAMIN D 25 Hydroxy (Vit-D Deficiency, Fractures) ? ?Elevated TSH  ?- Plan: TSH, T4, free, T3, free ? ?Stage 3b chronic kidney disease ([place]) ?-Check  kidney function today, last creatinine was 1.4 in October 2022 for a GFR 55. ? ?Dyslipidemia ? - Plan: Lipid panel ?-He is on atorvastatin 80 mg. ? ?Essential hypertension ?-Well-controlled on lisinopril. ? ?Tobacco use disorder ?Encounter for screening for lung cancer ? - Plan: Ambulatory Referral Lung Cancer Screening [facility] ? ?PAD (peripheral artery disease) ([place]) ?-Diagnosed with ABIs, under the care of vascular surgery, on cilostazol, no plan for intervention at present. ? ?Systolic murmur ?-Again identified on exam today, I have requested echocardiogram in the past, referral resent today. ? ? ? ? ? ?Patient Instructions  ?-Nice seeing you today!! ? ?-Lab work today; will notify you once results are available. ? ?-Remember your shingles and COVID vaccines. ? ?-Schedule follow up in 3-4 months. ? ? ? ? ? ?Lelon Frohlich, MD ?[facility] ? ? ?

## 2021-09-06 ENCOUNTER — Other Ambulatory Visit: Payer: Self-pay | Admitting: Internal Medicine

## 2021-09-06 ENCOUNTER — Encounter: Payer: Self-pay | Admitting: Internal Medicine

## 2021-09-06 DIAGNOSIS — R809 Proteinuria, unspecified: Secondary | ICD-10-CM | POA: Insufficient documentation

## 2021-09-06 DIAGNOSIS — E559 Vitamin D deficiency, unspecified: Secondary | ICD-10-CM

## 2021-09-06 MED ORDER — VITAMIN D (ERGOCALCIFEROL) 1.25 MG (50000 UNIT) PO CAPS: 50000.0000 [IU] | ORAL_CAPSULE | ORAL | 0 refills | Status: AC

## 2021-09-08 ENCOUNTER — Other Ambulatory Visit: Payer: Self-pay | Admitting: *Deleted

## 2021-09-08 DIAGNOSIS — E559 Vitamin D deficiency, unspecified: Secondary | ICD-10-CM

## 2021-09-08 DIAGNOSIS — N1832 Chronic kidney disease, stage 3b: Secondary | ICD-10-CM

## 2021-09-08 MED ORDER — EZETIMIBE 10 MG PO TABS: 10.0000 mg | ORAL_TABLET | Freq: Every day | ORAL | 1 refills | Status: DC

## 2021-09-26 ENCOUNTER — Other Ambulatory Visit: Payer: Self-pay | Admitting: Internal Medicine

## 2021-09-26 DIAGNOSIS — I10 Essential (primary) hypertension: Secondary | ICD-10-CM

## 2021-10-19 ENCOUNTER — Other Ambulatory Visit: Payer: Self-pay

## 2021-10-19 DIAGNOSIS — Z87891 Personal history of nicotine dependence: Secondary | ICD-10-CM

## 2021-10-19 DIAGNOSIS — Z122 Encounter for screening for malignant neoplasm of respiratory organs: Secondary | ICD-10-CM

## 2021-11-09 ENCOUNTER — Ambulatory Visit (INDEPENDENT_AMBULATORY_CARE_PROVIDER_SITE_OTHER): Payer: BC Managed Care – PPO | Admitting: Acute Care

## 2021-11-09 ENCOUNTER — Encounter: Payer: Self-pay | Admitting: Acute Care

## 2021-11-09 DIAGNOSIS — Z87891 Personal history of nicotine dependence: Secondary | ICD-10-CM | POA: Diagnosis not present

## 2021-11-09 NOTE — Patient Instructions (Signed)
Thank you for participating in the [facility] Cancer Screening Program. It was our pleasure to meet you today. We will call you with the results of your scan within the next few days. Your scan will be assigned a Lung RADS category score by the physicians reading the scans.  This Lung RADS score determines follow up scanning.  See below for description of categories, and follow up screening recommendations. We will be in touch to schedule your follow up screening annually or based on recommendations of our providers. We will fax a copy of your scan results to your Primary Care Physician, or the physician who referred you to the program, to ensure they have the results. Please call the office if you have any questions or concerns regarding your scanning experience or results.  Our office number is [phone]. Please speak with Denise Phelps, RN. , or  Denise Buckner RN, They are  our Lung Cancer Screening RN.'s If They are unavailable when you call, Please leave a message on the voice mail. We will return your call at our earliest convenience.This voice mail is monitored several times a day.  Remember, if your scan is normal, we will scan you annually as long as you continue to meet the criteria for the program. (Age 55-77, Current smoker or smoker who has quit within the last 15 years). If you are a smoker, remember, quitting is the single most powerful action that you can take to decrease your risk of lung cancer and other pulmonary, breathing related problems. We know quitting is hard, and we are here to help.  Please let us know if there is anything we can do to help you meet your goal of quitting. If you are a former smoker, congratulations. We are proud of you! Remain smoke free! Remember you can refer friends or family members through the number above.  We will screen them to make sure they meet criteria for the program. Thank you for helping us take better care of you by  participating in Lung Screening.  You can receive free nicotine replacement therapy ( patches, gum or mints) by calling 1-800-QUIT NOW. Please call so we can get you on the path to becoming  a non-smoker. I know it is hard, but you can do this!  Lung RADS Categories:  Lung RADS 1: no nodules or definitely non-concerning nodules.  Recommendation is for a repeat annual scan in 12 months.  Lung RADS 2:  nodules that are non-concerning in appearance and behavior with a very low likelihood of becoming an active cancer. Recommendation is for a repeat annual scan in 12 months.  Lung RADS 3: nodules that are probably non-concerning , includes nodules with a low likelihood of becoming an active cancer.  Recommendation is for a 6-month repeat screening scan. Often noted after an upper respiratory illness. We will be in touch to make sure you have no questions, and to schedule your 6-month scan.  Lung RADS 4 A: nodules with concerning findings, recommendation is most often for a follow up scan in 3 months or additional testing based on our provider's assessment of the scan. We will be in touch to make sure you have no questions and to schedule the recommended 3 month follow up scan.  Lung RADS 4 B:  indicates findings that are concerning. We will be in touch with you to schedule additional diagnostic testing based on our provider's  assessment of the scan.  Other options for assistance in smoking cessation (   As covered by your insurance benefits)  Hypnosis for smoking cessation  [employer] [phone]  Acupuncture for smoking cessation  [facility] [phone]   

## 2021-11-09 NOTE — Progress Notes (Signed)
Virtual Visit via Telephone Note  I connected with Jacob Arnold on 11/09/21 at 10:00 AM EDT by telephone and verified that I am speaking with the correct person using two identifiers.  Location: Patient: At home Provider:  [address], [place], [place], [address]    I discussed the limitations, risks, security and privacy concerns of performing an evaluation and management service by telephone and the availability of in person appointments. I also discussed with the patient that there may be a patient responsible charge related to this service. The patient expressed understanding and agreed to proceed.   Shared Decision Making Visit Lung Cancer Screening Program [phone])   Eligibility: Age 61 y.o. Pack Years Smoking History Calculation 37 pack year smoking history (# packs/per year x # years smoked) Recent History of coughing up blood  no Unexplained weight loss? no ( >Than 15 pounds within the last 6 months ) Prior History Lung / other cancer no (Diagnosis within the last 5 years already requiring surveillance chest CT Scans). Smoking Status Former Smoker Former Smokers: Years since quit: 1 year  Quit Date: 2022  Visit Components: Discussion included one or more decision making aids. yes Discussion included risk/benefits of screening. yes Discussion included potential follow up diagnostic testing for abnormal scans. yes Discussion included meaning and risk of over diagnosis. yes Discussion included meaning and risk of False Positives. yes Discussion included meaning of total radiation exposure. yes  Counseling Included: Importance of adherence to annual lung cancer LDCT screening. yes Impact of comorbidities on ability to participate in the program. yes Ability and willingness to under diagnostic treatment. yes  Smoking Cessation Counseling: Current Smokers:  Discussed importance of smoking cessation. yes Information about tobacco cessation classes and  interventions provided to patient. yes Patient provided with "ticket" for LDCT Scan. yes Symptomatic Patient. no  Counseling NA Diagnosis Code: Tobacco Use Z72.0 Asymptomatic Patient yes  Counseling (Intermediate counseling: > three minutes counseling) U9811 Former Smokers:  Discussed the importance of maintaining cigarette abstinence. yes Diagnosis Code: Personal History of Nicotine Dependence. B14.782 Information about tobacco cessation classes and interventions provided to patient. Yes Patient provided with "ticket" for LDCT Scan. yes Written Order for Lung Cancer Screening with LDCT placed in Epic. Yes (CT Chest Lung Cancer Screening Low Dose W/O CM) [id number] Z12.2-Screening of respiratory organs Z87.891-Personal history of nicotine dependence  I spent 25 minutes of face to face time/virtual visit time  with  Jacob Arnold discussing the risks and benefits of lung cancer screening. We took the time to pause the power point at intervals to allow for questions to be asked and answered to ensure understanding. We discussed that he had taken the single most powerful action possible to decrease his risk of developing lung cancer when he quit smoking. I counseled him to remain smoke free, and to contact me if he ever had the desire to smoke again so that I can provide resources and tools to help support the effort to remain smoke free. We discussed the time and location of the scan, and that either  Jacob Glassman RN, Jacob Prince, RN or I  or I will call / send a letter with the results within  24-72 hours of receiving them. He has the office contact information in the event he needs to speak with me,  he  verbalized understanding of all of the above and had no further questions upon leaving the office.     I explained to the patient that there has  been a high incidence of coronary artery disease noted on these exams. I explained that this is a non-gated exam therefore degree or severity cannot be  determined. This patient is  on statin therapy. I have asked the patient to follow-up with their PCP regarding any incidental finding of coronary artery disease and management with diet or medication as they feel is clinically indicated. The patient verbalized understanding of the above and had no further questions.     Jacob Spatz, NP 11/09/2021

## 2021-11-11 ENCOUNTER — Ambulatory Visit (HOSPITAL_BASED_OUTPATIENT_CLINIC_OR_DEPARTMENT_OTHER)
Admission: RE | Admit: 2021-11-11 | Discharge: 2021-11-11 | Disposition: A | Discharge status: Home / Self Care | Payer: BC Managed Care – PPO | Source: Ambulatory Visit | Attending: Acute Care | Admitting: Acute Care

## 2021-11-11 DIAGNOSIS — I7121 Aneurysm of the ascending aorta, without rupture: Secondary | ICD-10-CM | POA: Insufficient documentation

## 2021-11-11 DIAGNOSIS — I700 Atherosclerosis of aorta: Secondary | ICD-10-CM | POA: Diagnosis not present

## 2021-11-11 DIAGNOSIS — I2510 Atherosclerotic heart disease of native coronary artery without angina pectoris: Secondary | ICD-10-CM | POA: Diagnosis not present

## 2021-11-11 DIAGNOSIS — Z122 Encounter for screening for malignant neoplasm of respiratory organs: Secondary | ICD-10-CM | POA: Diagnosis not present

## 2021-11-11 DIAGNOSIS — Z87891 Personal history of nicotine dependence: Secondary | ICD-10-CM | POA: Insufficient documentation

## 2021-11-11 DIAGNOSIS — J439 Emphysema, unspecified: Secondary | ICD-10-CM | POA: Diagnosis not present

## 2021-11-14 ENCOUNTER — Other Ambulatory Visit: Payer: Self-pay | Admitting: Acute Care

## 2021-11-14 DIAGNOSIS — Z87891 Personal history of nicotine dependence: Secondary | ICD-10-CM

## 2021-11-14 DIAGNOSIS — Z122 Encounter for screening for malignant neoplasm of respiratory organs: Secondary | ICD-10-CM

## 2021-11-17 ENCOUNTER — Other Ambulatory Visit: Payer: Self-pay | Admitting: Internal Medicine

## 2021-11-17 DIAGNOSIS — E785 Hyperlipidemia, unspecified: Secondary | ICD-10-CM

## 2021-11-17 DIAGNOSIS — I10 Essential (primary) hypertension: Secondary | ICD-10-CM

## 2021-12-15 ENCOUNTER — Other Ambulatory Visit: Payer: Self-pay | Admitting: Internal Medicine

## 2021-12-15 ENCOUNTER — Other Ambulatory Visit: Payer: Self-pay | Admitting: Surgery

## 2021-12-15 DIAGNOSIS — I10 Essential (primary) hypertension: Secondary | ICD-10-CM

## 2022-01-23 ENCOUNTER — Encounter: Payer: Self-pay | Admitting: Internal Medicine

## 2022-01-23 ENCOUNTER — Ambulatory Visit: Payer: BC Managed Care – PPO | Admitting: Internal Medicine

## 2022-01-23 VITALS — BP 119/80 | HR 79 | Temp 97.7°F | Resp 18 | Ht 70.0 in | Wt 183.2 lb

## 2022-01-23 DIAGNOSIS — I739 Peripheral vascular disease, unspecified: Secondary | ICD-10-CM

## 2022-01-23 DIAGNOSIS — E1151 Type 2 diabetes mellitus with diabetic peripheral angiopathy without gangrene: Secondary | ICD-10-CM | POA: Diagnosis not present

## 2022-01-23 DIAGNOSIS — E785 Hyperlipidemia, unspecified: Secondary | ICD-10-CM

## 2022-01-23 DIAGNOSIS — E1169 Type 2 diabetes mellitus with other specified complication: Secondary | ICD-10-CM

## 2022-01-23 DIAGNOSIS — I700 Atherosclerosis of aorta: Secondary | ICD-10-CM

## 2022-01-23 DIAGNOSIS — I10 Essential (primary) hypertension: Secondary | ICD-10-CM | POA: Diagnosis not present

## 2022-01-23 DIAGNOSIS — R011 Cardiac murmur, unspecified: Secondary | ICD-10-CM

## 2022-01-23 DIAGNOSIS — N1832 Chronic kidney disease, stage 3b: Secondary | ICD-10-CM

## 2022-01-23 LAB — POCT GLYCOSYLATED HEMOGLOBIN (HGB A1C): Hemoglobin A1C: 5.7 % — AB (ref 4.0–5.6)

## 2022-01-23 NOTE — Progress Notes (Signed)
Established Patient Office Visit     CC/Reason for Visit: Follow-up chronic medical conditions  HPI: Jacob Arnold is a 61 y.o. male who is coming in today for the above mentioned reasons. Past Medical History is significant for: Type 2 diabetes which is newly diagnosed, hypertension, hyperlipidemia, stage II-III chronic kidney disease, quit smoking last year, PAD.  He is feeling well.  He has lost some weight.  He continues to complain of claudication symptoms.   Past Medical/Surgical History: Past Medical History:  Diagnosis Date   ED (erectile dysfunction)    HYPERLIPIDEMIA    HYPERTENSION    Panic attacks    Tobacco abuse     Past Surgical History:  Procedure Laterality Date   TONSILLECTOMY     VASECTOMY      Social History:  reports that he quit smoking about 21 months ago. His smoking use included cigarettes. He has a 37.00 pack-year smoking history. He has never used smokeless tobacco. He reports current alcohol use of about 6.0 standard drinks of alcohol per week. He reports that he does not use drugs.  Allergies: Allergies  Allergen Reactions   Diphenhydramine Hcl Itching and Swelling    Family History:  Family History  Problem Relation Age of Onset   Breast cancer Mother    Liver cancer Mother    Diabetes Father    Hyperlipidemia Neg Hx        family hx   Heart disease Neg Hx        family hx   Hypertension Neg Hx        family hx   Cancer Neg Hx        family hx     Current Outpatient Medications:    amLODipine (NORVASC) 10 MG tablet, TAKE 1 TABLET BY MOUTH EVERY DAY, Disp: 90 tablet, Rfl: 1   aspirin 81 MG tablet, Take 1 tablet (81 mg total) by mouth daily., Disp: 30 tablet, Rfl:    atorvastatin (LIPITOR) 80 MG tablet, TAKE 1 TABLET BY MOUTH EVERY DAY, Disp: 90 tablet, Rfl: 1   cilostazol (PLETAL) 100 MG tablet, TAKE 1 TABLET BY MOUTH TWICE A DAY BEFORE MEALS, Disp: 180 tablet, Rfl: 2   ezetimibe (ZETIA) 10 MG tablet, Take 1 tablet (10  mg total) by mouth daily., Disp: 90 tablet, Rfl: 1   hydrALAZINE (APRESOLINE) 50 MG tablet, TAKE 1.5 TABLETS (75 MG TOTAL) BY MOUTH 3 (THREE) TIMES DAILY., Disp: 405 tablet, Rfl: 1   labetalol (NORMODYNE) 200 MG tablet, TAKE 2 TABLETS BY MOUTH 2 TIMES DAILY., Disp: 360 tablet, Rfl: 1   lisinopril (ZESTRIL) 10 MG tablet, TAKE 1 TABLET BY MOUTH EVERY DAY, Disp: 90 tablet, Rfl: 0  Review of Systems:  Constitutional: Denies fever, chills, diaphoresis, appetite change and fatigue.  HEENT: Denies photophobia, eye pain, redness, hearing loss, ear pain, congestion, sore throat, rhinorrhea, sneezing, mouth sores, trouble swallowing, neck pain, neck stiffness and tinnitus.   Respiratory: Denies SOB, DOE, cough, chest tightness,  and wheezing.   Cardiovascular: Denies chest pain, palpitations and leg swelling.  Gastrointestinal: Denies nausea, vomiting, abdominal pain, diarrhea, constipation, blood in stool and abdominal distention.  Genitourinary: Denies dysuria, urgency, frequency, hematuria, flank pain and difficulty urinating.  Endocrine: Denies: hot or cold intolerance, sweats, changes in hair or nails, polyuria, polydipsia. Musculoskeletal: Denies myalgias, back pain, joint swelling, arthralgias and gait problem.  Skin: Denies pallor, rash and wound.  Neurological: Denies dizziness, seizures, syncope, weakness, light-headedness, numbness and headaches.  Hematological: Denies adenopathy. Easy bruising, personal or family bleeding history  Psychiatric/Behavioral: Denies suicidal ideation, mood changes, confusion, nervousness, sleep disturbance and agitation    Physical Exam: Vitals:   01/23/22 0933  BP: 119/80  Pulse: 79  Resp: 18  Temp: 97.7 F (36.5 C)  TempSrc: Oral  SpO2: 96%  Weight: 183 lb 3 oz (83.1 kg)  Height: '5\' 10"'$  (1.778 m)    Body mass index is 26.28 kg/m.   Constitutional: NAD, calm, comfortable Eyes: PERRL, lids and conjunctivae normal ENMT: Mucous membranes are  moist.  Respiratory: clear to auscultation bilaterally, no wheezing, no crackles. Normal respiratory effort. No accessory muscle use.  Cardiovascular: Regular rate and rhythm, positive systolic murmur no/ rubs / gallops. No extremity edema.   Psychiatric: Normal judgment and insight. Alert and oriented x 3. Normal mood.    Impression and Plan:  Type 2 diabetes mellitus with diabetic peripheral angiopathy without gangrene, without long-term current use of insulin ([place]) - Plan: POCT glycosylated hemoglobin (Hb A1C)  PAD (peripheral artery disease) (HCC)  Essential hypertension  Hyperlipidemia associated with type 2 diabetes mellitus (HCC)  Stage 3b chronic kidney disease (HCC)  Aortic atherosclerosis ([place])  -Blood pressure is well controlled. -A1c shows significant improvement down to 5.7. -He has his first appointment with nephrology coming up. -He is compliant with all medication including statin and blood pressure. -He has an appointment with vascular in January as he continues to complain of claudication symptoms. -He had his lung cancer screening CT in July and was noted to have aortic atherosclerosis, he is already on a statin. -He has an echocardiogram pending to evaluate a systolic murmur.   Time spent:32 minutes reviewing chart, interviewing and examining patient and formulating plan of care.     Lelon Frohlich, MD [facility]

## 2022-01-23 NOTE — Addendum Note (Signed)
Addended by: Westley Hummer B on: 01/23/2022 10:14 AM   Modules accepted: Orders

## 2022-01-26 ENCOUNTER — Ambulatory Visit (HOSPITAL_COMMUNITY): Payer: BC Managed Care – PPO | Attending: Internal Medicine

## 2022-01-26 DIAGNOSIS — R011 Cardiac murmur, unspecified: Secondary | ICD-10-CM

## 2022-01-26 LAB — ECHOCARDIOGRAM COMPLETE
Area-P 1/2: 3.89 cm2
P 1/2 time: 466 msec
S' Lateral: 3.1 cm

## 2022-01-30 ENCOUNTER — Encounter: Payer: Self-pay | Admitting: Internal Medicine

## 2022-01-30 DIAGNOSIS — I351 Nonrheumatic aortic (valve) insufficiency: Secondary | ICD-10-CM | POA: Insufficient documentation

## 2022-01-31 ENCOUNTER — Other Ambulatory Visit: Payer: Self-pay

## 2022-01-31 DIAGNOSIS — R011 Cardiac murmur, unspecified: Secondary | ICD-10-CM

## 2022-02-27 NOTE — Progress Notes (Signed)
CARDIOLOGY CONSULT NOTE       Patient ID: Jacob Arnold MRN: [id number] DOB/AGE: 01-04-1961 61 y.o.  Admit date: (Not on file) Referring Physician: [name] Primary Physician: Jacob Arnold Primary Cardiologist: new Reason for Consultation: Murmur  Active Problems:   * No active hospital problems. *   HPI:  61 y.o. referred by Dr Jacob Arnold for murmur. History of prior smoking, HTN, HLD, DM-2 CRF and PAD. 37 pack year history of smoking Quit about 2 years ago Lung cancer CT 11/11/21 no cancer. Aortic atherosclerosis and emphysema Ascending thoracic aort 4.2 cm Note made of coronary calcium   TTE 01/26/22 reviewed EF 55-60% mild LVH no LVE mild/mod AR Aorta 4.1 cm  ABI 02/28/21 right 0.62 and left 0.76  He has VVS appointment coming up   Most recent labs show Cr 1.85 K 4.3 A1c 6 LDL 79   No chest pain. Claudication in both calves can walk through and pletal has helped No ulcers Or resting pain   Works in Pharmacologist From [place]  met wife at [employer] 3 older children in [place] area     ROS All other systems reviewed and negative except as noted above  Past Medical History:  Diagnosis Date   ED (erectile dysfunction)    HYPERLIPIDEMIA    HYPERTENSION    Panic attacks    Tobacco abuse     Family History  Problem Relation Age of Onset   Breast cancer Mother    Liver cancer Mother    Diabetes Father    Hyperlipidemia Neg Hx        family hx   Heart disease Neg Hx        family hx   Hypertension Neg Hx        family hx   Cancer Neg Hx        family hx    Social History   Socioeconomic History   Marital status: Married    Spouse name: Not on file   Number of children: Not on file   Years of education: Not on file   Highest education level: Not on file  Occupational History   Not on file  Tobacco Use   Smoking status: Former    Packs/day: 1.00    Years: 37.00    Total pack years: 37.00    Types: Cigarettes    Quit date: 2022     Years since quitting: 1.8   Smokeless tobacco: Never   Tobacco comments:    Quit 2022  Vaping Use   Vaping Use: Never used  Substance and Sexual Activity   Alcohol use: Yes    Alcohol/week: 6.0 standard drinks of alcohol    Types: 6 Cans of beer per week   Drug use: No   Sexual activity: Not on file  Other Topics Concern   Not on file  Social History Narrative   Not on file   Social Determinants of Health   Financial Resource Strain: Unknown (01/22/2022)   Overall Financial Resource Strain (CARDIA)    Difficulty of Paying Living Expenses: Patient refused  Food Insecurity: Unknown (01/22/2022)   Hunger Vital Sign    Worried About Running Out of Food in the Last Year: Patient refused    [place] in the Last Year: Patient refused  Transportation Needs: Unknown (01/22/2022)   PRAPARE - Hydrologist (Medical): Patient refused    Lack of Transportation (Non-Medical): Patient refused  Physical Activity: Not on file  Stress: Not on file  Social Connections: Unknown (01/22/2022)   Social Connection and Isolation Panel [NHANES]    Frequency of Communication with Friends and Family: Not on file    Frequency of Social Gatherings with Friends and Family: Not on file    Attends Religious Services: Not on Advertising copywriter or Organizations: Patient refused    Attends Archivist Meetings: Not on file    Marital Status: Patient refused  Intimate Partner Violence: Not on file    Past Surgical History:  Procedure Laterality Date   TONSILLECTOMY     VASECTOMY        Current Outpatient Medications:    amLODipine (NORVASC) 10 MG tablet, TAKE 1 TABLET BY MOUTH EVERY DAY, Disp: 90 tablet, Rfl: 1   aspirin 81 MG tablet, Take 1 tablet (81 mg total) by mouth daily., Disp: 30 tablet, Rfl:    atorvastatin (LIPITOR) 80 MG tablet, TAKE 1 TABLET BY MOUTH EVERY DAY, Disp: 90 tablet, Rfl: 1   cilostazol (PLETAL) 100 MG tablet, TAKE 1 TABLET BY  MOUTH TWICE A DAY BEFORE MEALS, Disp: 180 tablet, Rfl: 2   ezetimibe (ZETIA) 10 MG tablet, Take 1 tablet (10 mg total) by mouth daily., Disp: 90 tablet, Rfl: 1   hydrALAZINE (APRESOLINE) 50 MG tablet, TAKE 1.5 TABLETS (75 MG TOTAL) BY MOUTH 3 (THREE) TIMES DAILY., Disp: 405 tablet, Rfl: 1   labetalol (NORMODYNE) 200 MG tablet, TAKE 2 TABLETS BY MOUTH 2 TIMES DAILY., Disp: 360 tablet, Rfl: 1   lisinopril (ZESTRIL) 10 MG tablet, TAKE 1 TABLET BY MOUTH EVERY DAY, Disp: 90 tablet, Rfl: 0    Physical Exam: There were no vitals taken for this visit.    Affect appropriate Healthy:  appears stated age 16: normal Neck supple with no adenopathy JVP normal right bruits no thyromegaly Lungs clear with no wheezing and good diaphragmatic motion Heart:  S1/S2 SEM  murmur, no rub, gallop or click PMI normal Abdomen: benighn, BS positve, no tenderness, no AAA no bruit.  No HSM or HJR Distal pulses diminished left pedal  No edema Neuro non-focal Skin warm and dry No muscular weakness   Labs:   Lab Results  Component Value Date   WBC 9.2 09/01/2021   HGB 14.5 09/01/2021   HCT 42.8 09/01/2021   MCV 96.0 09/01/2021   PLT 216.0 09/01/2021   No results for input(s): "NA", "K", "CL", "CO2", "BUN", "CREATININE", "CALCIUM", "PROT", "BILITOT", "ALKPHOS", "ALT", "AST", "GLUCOSE" in the last 168 hours.  Invalid input(s): "LABALBU" Lab Results  Component Value Date   TROPONINI <0.30 10/04/2011    Lab Results  Component Value Date   CHOL 145 09/01/2021   CHOL 171 01/28/2021   CHOL 238 (H) 01/20/2020   Lab Results  Component Value Date   HDL 37.30 (L) 09/01/2021   HDL 37.70 (L) 01/28/2021   HDL 35 (L) 01/20/2020   Lab Results  Component Value Date   LDLCALC 79 09/01/2021   [employer]  01/20/2020     Comment:     . LDL cholesterol not calculated. Triglyceride levels greater than 400 mg/dL invalidate calculated LDL results. . Reference range: <100 . Desirable range <100 mg/dL for  primary prevention;   <70 mg/dL for patients with CHD or diabetic patients  with > or = 2 CHD risk factors. Marland Kitchen LDL-C is now calculated using the Martin-Hopkins  calculation, which is a validated novel method providing  better accuracy  than the Friedewald equation in the  estimation of LDL-C.  Cresenciano Genre et al. Annamaria Helling. 7096;438(38): 2061-2068  (http://education.Quest[employer].com/faq/FAQ164)    Lab Results  Component Value Date   TRIG 143.0 09/01/2021   TRIG 348.0 (H) 01/28/2021   TRIG 726 (H) 01/20/2020   Lab Results  Component Value Date   CHOLHDL 4 09/01/2021   CHOLHDL 5 01/28/2021   CHOLHDL 6.8 (H) 01/20/2020   Lab Results  Component Value Date   LDLDIRECT 73.0 01/28/2021   LDLDIRECT 93.0 05/28/2018   LDLDIRECT 95.0 05/09/2017      Radiology: No results found.  EKG: SR PR 214 LAD ICRBBB    ASSESSMENT AND PLAN:   Murmur:  mild/mod AR and AV sclerosis LV compensated control BP f/u TTE in 2 years CAD:  seen on lung cancer CT with multiple risk factors and PVD Cardiac CTA take normady ne2 hours prior check BMET today mild CRF should be ok  HTN:  continue ACE, beta blocker, hydralazine and norvasc DASH diet HLD:  on Lipitor and zetia continue  DM:  Discussed low carb diet.  Target hemoglobin A1c is 6.5 or less.  Continue current medications.  PVD. Moderate reduction in ABI's continue Pletal f/u VVS Check carotids Emphysema:  quit smoking lung cancer CT ok 10/23/21 f/u Pulmonary Eric Form CRF:  Cr 1.85 f/u nephrology continue ACE given PVD check renal duplex r/o RAS Abnormal eCG:: evidence for conduction dx with first degree LAD and ICRBBB Yearly ECG    Cardiac CT Carotid duplex Renal duplex  F/U pulmonary   F/U cardiology in a year   Signed: Jenkins Rouge 02/27/2022, 7:54 AM

## 2022-03-09 ENCOUNTER — Ambulatory Visit: Payer: BC Managed Care – PPO | Attending: Cardiovascular Disease | Admitting: Cardiovascular Disease

## 2022-03-09 ENCOUNTER — Encounter: Payer: Self-pay | Admitting: Cardiovascular Disease

## 2022-03-09 VITALS — BP 132/58 | HR 79 | Ht 70.0 in | Wt 189.0 lb

## 2022-03-09 DIAGNOSIS — R072 Precordial pain: Secondary | ICD-10-CM | POA: Diagnosis not present

## 2022-03-09 DIAGNOSIS — I150 Renovascular hypertension: Secondary | ICD-10-CM | POA: Diagnosis not present

## 2022-03-09 DIAGNOSIS — I739 Peripheral vascular disease, unspecified: Secondary | ICD-10-CM

## 2022-03-09 DIAGNOSIS — R0989 Other specified symptoms and signs involving the circulatory and respiratory systems: Secondary | ICD-10-CM | POA: Diagnosis not present

## 2022-03-09 DIAGNOSIS — I701 Atherosclerosis of renal artery: Secondary | ICD-10-CM | POA: Diagnosis not present

## 2022-03-09 DIAGNOSIS — I2510 Atherosclerotic heart disease of native coronary artery without angina pectoris: Secondary | ICD-10-CM

## 2022-03-09 DIAGNOSIS — E782 Mixed hyperlipidemia: Secondary | ICD-10-CM

## 2022-03-09 NOTE — Patient Instructions (Addendum)
Medication Instructions:  Your physician recommends that you continue on your current medications as directed. Please refer to the Current Medication list given to you today.  *If you need a refill on your cardiac medications before your next appointment, please call your pharmacy*  Lab Work: Your physician recommends that you have lab work today- BMET  If you have labs (blood work) drawn today and your tests are completely normal, you will receive your results only by: MyChart Message (if you have MyChart) OR A paper copy in the mail If you have any lab test that is abnormal or we need to change your treatment, we will call you to review the results.  Testing/Procedures: Your physician has requested that you have cardiac CT. Cardiac computed tomography (CT) is a painless test that uses an x-ray machine to take clear, detailed pictures of your heart. For further information please visit [email or web address]. Please follow instruction sheet as given.  Your physician has requested that you have a carotid duplex. This test is an ultrasound of the carotid arteries in your neck. It looks at blood flow through these arteries that supply the brain with blood. Allow one hour for this exam. There are no restrictions or special instructions.  Your physician has requested that you have a renal artery duplex. During this test, an ultrasound is used to evaluate blood flow to the kidneys. Allow one hour for this exam. Do not eat after midnight the day before and avoid carbonated beverages. Take your medications as you usually do.   Follow-Up: At [facility], you and your health needs are our priority.  As part of our continuing mission to provide you with exceptional heart care, we have created designated Provider Care Teams.  These Care Teams include your primary Cardiologist (physician) and Advanced Practice Providers (APPs -  Physician Assistants and Nurse Practitioners) who all work together to  provide you with the care you need, when you need it.  We recommend signing up for the patient portal called "MyChart".  Sign up information is provided on this After Visit Summary.  MyChart is used to connect with patients for Virtual Visits (Telemedicine).  Patients are able to view lab/test results, encounter notes, upcoming appointments, etc.  Non-urgent messages can be sent to your provider as well.   To learn more about what you can do with MyChart, go to [email or web address].    Your next appointment:   1 year(s)  The format for your next appointment:   In Person  Provider:   Jenkins Rouge, MD     Other Instructions   Your cardiac CT will be scheduled at one of the below locations:   [facility] [address] [place], [place] [address] [phone]   If scheduled at [facility], please arrive at the [facility] and Children's Entrance (Entrance C2) of [facility] 30 minutes prior to test start time. You can use the FREE valet parking offered at entrance C (encouraged to control the heart rate for the test)  Proceed to the [facility] Radiology Department (first floor) to check-in and test prep.  All radiology patients and guests should use entrance C2 at [facility], accessed from [facility], even though the hospital's physical address listed is [address].     Please follow these instructions carefully (unless otherwise directed):  Hold all erectile dysfunction medications at least 3 days (72 hrs) prior to test. (Ie viagra, cialis, sildenafil, tadalafil, etc) We will  administer nitroglycerin during this exam.   On the Night Before the Test: Be sure to Drink plenty of water. Do not consume any caffeinated/decaffeinated beverages or chocolate 12 hours prior to your test. Do not take any antihistamines 12 hours prior to your test.  On the Day of the Test: Drink plenty of water until 1 hour prior to the  test. Do not eat any food 1 hour prior to test. You may take your regular medications prior to the test.  Take labetalol two hours prior to test.  After the Test: Drink plenty of water. After receiving IV contrast, you may experience a mild flushed feeling. This is normal. On occasion, you may experience a mild rash up to 24 hours after the test. This is not dangerous. If this occurs, you can take Benadryl 25 mg and increase your fluid intake. If you experience trouble breathing, this can be serious. If it is severe call 911 IMMEDIATELY. If it is mild, please call our office.   We will call to schedule your test 2-4 weeks out understanding that some insurance companies will need an authorization prior to the service being performed.   For non-scheduling related questions, please contact the cardiac imaging nurse navigator should you have any questions/concerns: Marchia Bond, Cardiac Imaging Nurse Navigator Gordy Clement, Cardiac Imaging Nurse Navigator [facility] Direct Office Dial: [phone]   For scheduling needs, including cancellations and rescheduling, please call [place], [phone].   Important Information About Sugar

## 2022-03-10 LAB — BASIC METABOLIC PANEL
BUN/Creatinine Ratio: 13 (ref 10–24)
BUN: 20 mg/dL (ref 8–27)
CO2: 21 mmol/L (ref 20–29)
Calcium: 9.7 mg/dL (ref 8.6–10.2)
Chloride: 104 mmol/L (ref 96–106)
Creatinine, Ser: 1.53 mg/dL — ABNORMAL HIGH (ref 0.76–1.27)
Glucose: 123 mg/dL — ABNORMAL HIGH (ref 70–99)
Potassium: 4.5 mmol/L (ref 3.5–5.2)
Sodium: 140 mmol/L (ref 134–144)
eGFR: 52 mL/min/{1.73_m2} — ABNORMAL LOW (ref >59)

## 2022-03-12 ENCOUNTER — Other Ambulatory Visit: Payer: Self-pay | Admitting: Internal Medicine

## 2022-03-22 DIAGNOSIS — I129 Hypertensive chronic kidney disease with stage 1 through stage 4 chronic kidney disease, or unspecified chronic kidney disease: Secondary | ICD-10-CM | POA: Diagnosis not present

## 2022-03-22 DIAGNOSIS — E1122 Type 2 diabetes mellitus with diabetic chronic kidney disease: Secondary | ICD-10-CM | POA: Diagnosis not present

## 2022-03-22 DIAGNOSIS — N1831 Chronic kidney disease, stage 3a: Secondary | ICD-10-CM | POA: Diagnosis not present

## 2022-03-22 DIAGNOSIS — R809 Proteinuria, unspecified: Secondary | ICD-10-CM | POA: Diagnosis not present

## 2022-03-22 DIAGNOSIS — E559 Vitamin D deficiency, unspecified: Secondary | ICD-10-CM | POA: Diagnosis not present

## 2022-03-22 LAB — CBC AND DIFFERENTIAL
HCT: 47 (ref 41–53)
Hemoglobin: 16.3 (ref 13.5–17.5)
Platelets: 248 10*3/uL (ref 150–400)
WBC: 9.5

## 2022-03-22 LAB — CBC: RBC: 5.05 (ref 3.87–5.11)

## 2022-03-22 LAB — VITAMIN D 25 HYDROXY (VIT D DEFICIENCY, FRACTURES): Vit D, 25-Hydroxy: 43.5

## 2022-03-24 ENCOUNTER — Ambulatory Visit (HOSPITAL_COMMUNITY)
Admission: RE | Admit: 2022-03-24 | Discharge: 2022-03-24 | Disposition: A | Discharge status: Home / Self Care | Payer: BC Managed Care – PPO | Source: Ambulatory Visit | Attending: Cardiovascular Disease | Admitting: Cardiovascular Disease

## 2022-03-24 ENCOUNTER — Telehealth (HOSPITAL_COMMUNITY): Payer: Self-pay | Admitting: Emergency Medicine

## 2022-03-24 DIAGNOSIS — R0989 Other specified symptoms and signs involving the circulatory and respiratory systems: Secondary | ICD-10-CM | POA: Insufficient documentation

## 2022-03-24 DIAGNOSIS — I701 Atherosclerosis of renal artery: Secondary | ICD-10-CM | POA: Diagnosis not present

## 2022-03-24 NOTE — Telephone Encounter (Signed)
Reaching out to patient to offer assistance regarding upcoming cardiac imaging study; pt verbalizes understanding of appt date/time, parking situation and where to check in, pre-test NPO status and medications ordered, and verified current allergies; name and call back number provided for further questions should they arise Marchia Bond RN Navigator Cardiac Imaging [facility] [phone] office [phone] cell  Arrival 1200 Denies iv issues  '200mg'$  labetolol 2 hr prior

## 2022-03-27 ENCOUNTER — Telehealth: Payer: Self-pay

## 2022-03-27 ENCOUNTER — Ambulatory Visit (HOSPITAL_COMMUNITY)
Admission: RE | Admit: 2022-03-27 | Discharge: 2022-03-27 | Disposition: A | Discharge status: Home / Self Care | Payer: BC Managed Care – PPO | Source: Ambulatory Visit | Attending: Cardiovascular Disease | Admitting: Cardiovascular Disease

## 2022-03-27 ENCOUNTER — Ambulatory Visit (HOSPITAL_BASED_OUTPATIENT_CLINIC_OR_DEPARTMENT_OTHER)
Admission: RE | Admit: 2022-03-27 | Discharge: 2022-03-27 | Disposition: A | Discharge status: Home / Self Care | Payer: BC Managed Care – PPO | Source: Ambulatory Visit | Attending: Internal Medicine | Admitting: Internal Medicine

## 2022-03-27 ENCOUNTER — Encounter (HOSPITAL_COMMUNITY): Payer: Self-pay

## 2022-03-27 DIAGNOSIS — R931 Abnormal findings on diagnostic imaging of heart and coronary circulation: Secondary | ICD-10-CM | POA: Diagnosis not present

## 2022-03-27 DIAGNOSIS — I6521 Occlusion and stenosis of right carotid artery: Secondary | ICD-10-CM

## 2022-03-27 DIAGNOSIS — I2510 Atherosclerotic heart disease of native coronary artery without angina pectoris: Secondary | ICD-10-CM | POA: Diagnosis not present

## 2022-03-27 DIAGNOSIS — R072 Precordial pain: Secondary | ICD-10-CM | POA: Diagnosis not present

## 2022-03-27 MED ORDER — IOHEXOL 350 MG/ML SOLN
100.0000 mL | Freq: Once | INTRAVENOUS | Status: AC | PRN
  Administered 2022-03-27: 100 mL via INTRAVENOUS

## 2022-03-27 MED ORDER — NITROGLYCERIN 0.4 MG SL SUBL
0.8000 mg | SUBLINGUAL_TABLET | Freq: Once | SUBLINGUAL | Status: AC
  Administered 2022-03-27: 0.8 mg via SUBLINGUAL

## 2022-03-27 MED ORDER — NITROGLYCERIN 0.4 MG SL SUBL
SUBLINGUAL_TABLET | SUBLINGUAL | Status: AC
  Filled 2022-03-27: qty 2

## 2022-03-27 NOTE — Telephone Encounter (Signed)
-----   Message from Josue Hector, MD sent at 03/27/2022  9:58 AM EST ----- Right ICA 60-79% f/u duplex in 6 months

## 2022-03-27 NOTE — Telephone Encounter (Signed)
The patient has been notified of the result and verbalized understanding.  All questions (if any) were answered. [place], RN 03/27/2022 3:22 PM   Will place order for repeat carotid in June.

## 2022-03-28 NOTE — Progress Notes (Signed)
CARDIOLOGY CONSULT NOTE       Patient ID: Jacob Arnold MRN: [id number] DOB/AGE: Aug 16, 1960 61 y.o.  Admit date: (Not on file) Referring Physician: [name] Primary Physician: [name], [name], MD Primary Cardiologist: [name]   HPI:  60 y.o. referred by Dr [name] for murmur. Initially seen 03/09/22 History of prior smoking, HTN, HLD, DM-2 CRF and PAD. 37 pack year history of smoking Quit about 2 years ago Lung cancer CT 11/11/21 no cancer. Aortic atherosclerosis and emphysema Ascending thoracic aort 4.2 cm Note made of coronary calcium   TTE 01/26/22 reviewed EF 55-60% mild LVH no LVE mild/mod AR Aorta 4.1 cm  ABI 02/28/21 right 0.62 and left 0.76  He has [facility] appointment coming up   Most recent labs show Cr 1.85 K 4.3 A1c 6 LDL 79   No chest pain. Claudication in both calves can walk through and pletal has helped No ulcers Or resting pain   Works in Pharmacologist From [place]  met wife at [employer] 3 older children in [place] area   Cardiac CTA reviewed from 03/27/22 Calcium score 502 91 st percentile for age/sex RCA total occlusion in mid vessel with distal vessel filling from ? Collaterals Mid LAD 50-70% and LCX 25-49% with negative FFR in these vessels   We also noted 60-79% RICA stenosis on duplex 03/24/22 Occluded/no flow to left kidney on duplex same day  He has exertional dyspnea We discussed doing a cath to further understand the morphology of his RCA occlusion to see if is a short segment occlusion that could be opened in future if needed. Also I have concern to make sure the LAD lesion is not worse as the FFR in this area was 0.81 distally His collaterals likely come from LAD and I would like to make sure this is not flow limiting   His claudication is mild and stable He sees brabham  He has f/u with renal and we discussed functionally single kidney Will hold ACE/ARB prior to cath and hydrate   Shared Decision Making/Informed Consent The risks [stroke (1 in  1000), death (1 in 1000), kidney failure [usually temporary] (1 in 500), bleeding (1 in 200), allergic reaction [possibly serious] (1 in 200)], benefits (diagnostic support and management of coronary artery disease) and alternatives of a cardiac catheterization were discussed in detail with [name] and she is willing to proceed.   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)    DM (diabetes mellitus) ([place])    ED (erectile dysfunction)    Elevated TSH    Hyperglycemia    HYPERLIPIDEMIA    HYPERTENSION    Panic attacks    Systolic murmur    Tobacco abuse    Vitamin D deficiency     Family History  Problem Relation Age of Onset   Breast cancer Mother    Liver cancer Mother    Diabetes Father    Hyperlipidemia Neg Hx        family hx   Heart disease Neg Hx        family hx   Hypertension Neg Hx        family hx   Cancer Neg Hx        family hx    Social History   Socioeconomic History   Marital status: Married    Spouse name: Not on file  Number of children: Not on file   Years of education: Not on file   Highest education level: Not on file  Occupational History   Not on file  Tobacco Use   Smoking status: Former    Packs/day: 1.00    Years: 37.00    Total pack years: 37.00    Types: Cigarettes    Quit date: 2022    Years since quitting: 1.9   Smokeless tobacco: Never   Tobacco comments:    Quit 2022  Vaping Use   Vaping Use: Never used  Substance and Sexual Activity   Alcohol use: Yes    Alcohol/week: 6.0 standard drinks of alcohol    Types: 6 Cans of beer per week   Drug use: No   Sexual activity: Not on file  Other Topics Concern   Not on file  Social History Narrative   Not on file   Social Determinants of Health   Financial Resource Strain: Unknown (01/22/2022)   Overall Financial Resource Strain (CARDIA)    Difficulty of  Paying Living Expenses: Patient refused  Food Insecurity: Unknown (01/22/2022)   Hunger Vital Sign    Worried About Running Out of Food in the Last Year: Patient refused    [place] in the Last Year: Patient refused  Transportation Needs: Unknown (01/22/2022)   PRAPARE - Hydrologist (Medical): Patient refused    Lack of Transportation (Non-Medical): Patient refused  Physical Activity: Not on file  Stress: Not on file  Social Connections: Unknown (01/22/2022)   Social Connection and Isolation Panel [NHANES]    Frequency of Communication with Friends and Family: Not on file    Frequency of Social Gatherings with Friends and Family: Not on file    Attends Religious Services: Not on file    Active Member of Clubs or Organizations: Patient refused    Attends Archivist Meetings: Not on file    Marital Status: Patient refused  Intimate Partner Violence: Not on file    Past Surgical History:  Procedure Laterality Date   TONSILLECTOMY     VASECTOMY        Current Outpatient Medications:    amLODipine (NORVASC) 10 MG tablet, TAKE 1 TABLET BY MOUTH EVERY DAY, Disp: 90 tablet, Rfl: 1   aspirin 81 MG tablet, Take 1 tablet (81 mg total) by mouth daily., Disp: 30 tablet, Rfl:    atorvastatin (LIPITOR) 80 MG tablet, TAKE 1 TABLET BY MOUTH EVERY DAY, Disp: 90 tablet, Rfl: 1   cilostazol (PLETAL) 100 MG tablet, TAKE 1 TABLET BY MOUTH TWICE A DAY BEFORE MEALS, Disp: 180 tablet, Rfl: 2   ezetimibe (ZETIA) 10 MG tablet, TAKE 1 TABLET BY MOUTH EVERY DAY, Disp: 90 tablet, Rfl: 1   hydrALAZINE (APRESOLINE) 50 MG tablet, TAKE 1.5 TABLETS (75 MG TOTAL) BY MOUTH 3 (THREE) TIMES DAILY., Disp: 405 tablet, Rfl: 1   labetalol (NORMODYNE) 200 MG tablet, TAKE 2 TABLETS BY MOUTH 2 TIMES DAILY., Disp: 360 tablet, Rfl: 1   lisinopril (ZESTRIL) 10 MG tablet, TAKE 1 TABLET BY MOUTH EVERY DAY, Disp: 90 tablet, Rfl: 0    Physical Exam: There were no vitals taken for this  visit.    Affect appropriate Healthy:  appears stated age 80: normal Neck supple with no adenopathy JVP normal right bruits no thyromegaly Lungs clear with no wheezing and good diaphragmatic motion Heart:  S1/S2 SEM  murmur, no rub, gallop or click PMI normal Abdomen: benighn,  BS positve, no tenderness, no AAA no bruit.  No HSM or HJR Distal pulses diminished left pedal  No edema Neuro non-focal Skin warm and dry No muscular weakness   Labs:   Lab Results  Component Value Date   WBC 9.2 09/01/2021   HGB 14.5 09/01/2021   HCT 42.8 09/01/2021   MCV 96.0 09/01/2021   PLT 216.0 09/01/2021   No results for input(s): "NA", "K", "CL", "CO2", "BUN", "CREATININE", "CALCIUM", "PROT", "BILITOT", "ALKPHOS", "ALT", "AST", "GLUCOSE" in the last 168 hours.  Invalid input(s): "LABALBU" Lab Results  Component Value Date   TROPONINI <0.30 10/04/2011    Lab Results  Component Value Date   CHOL 145 09/01/2021   CHOL 171 01/28/2021   CHOL 238 (H) 01/20/2020   Lab Results  Component Value Date   HDL 37.30 (L) 09/01/2021   HDL 37.70 (L) 01/28/2021   HDL 35 (L) 01/20/2020   Lab Results  Component Value Date   LDLCALC 79 09/01/2021   [place]  01/20/2020     Comment:     . LDL cholesterol not calculated. Triglyceride levels greater than 400 mg/dL invalidate calculated LDL results. . Reference range: <100 . Desirable range <100 mg/dL for primary prevention;   <70 mg/dL for patients with CHD or diabetic patients  with > or = 2 CHD risk factors. Marland Kitchen LDL-C is now calculated using the Martin-Hopkins  calculation, which is a validated novel method providing  better accuracy than the Friedewald equation in the  estimation of LDL-C.  Cresenciano Genre et al. Annamaria Helling. 1601;093(23): 2061-2068  (http://education.[facility].com/faq/FAQ164)    Lab Results  Component Value Date   TRIG 143.0 09/01/2021   TRIG 348.0 (H) 01/28/2021   TRIG 726 (H) 01/20/2020   Lab Results  Component  Value Date   CHOLHDL 4 09/01/2021   CHOLHDL 5 01/28/2021   CHOLHDL 6.8 (H) 01/20/2020   Lab Results  Component Value Date   LDLDIRECT 73.0 01/28/2021   LDLDIRECT 93.0 05/28/2018   LDLDIRECT 95.0 05/09/2017      Radiology: CT CORONARY MORPH W/CTA COR W/SCORE W/CA W/CM &/OR WO/CM  Addendum Date: 03/27/2022   ADDENDUM REPORT: 03/27/2022 15:59 EXAM: OVER-READ INTERPRETATION  CT CHEST The following report is an over-read performed by radiologist Dr. Samara Snide [facility] on 03/27/2022. This over-read does not include interpretation of cardiac or coronary anatomy or pathology. The coronary CTA interpretation by the cardiologist is attached. COMPARISON:  11/11/2021 screening chest CT. FINDINGS: Cardiovascular: Normal heart size. No significant pericardial effusion/thickening. Atherosclerotic thoracic aorta with dilated 4.1 cm ascending thoracic aorta. Normal caliber pulmonary arteries. No central pulmonary emboli. Mediastinum/Nodes: Unremarkable esophagus. No pathologically enlarged mediastinal or hilar lymph nodes. Lungs/Pleura: No pneumothorax. No pleural effusion. No acute consolidative airspace disease or lung masses. Left upper lobe solid 0.4 cm pulmonary nodule (series 11/image 1), unchanged from 11/11/2021 CT. No new significant pulmonary nodules. Upper abdomen: No acute abnormality. Musculoskeletal:  No aggressive appearing focal osseous lesions. IMPRESSION: 1. Left upper lobe solid 0.4 cm pulmonary nodule, unchanged from 11/11/2021 CT. No new significant pulmonary nodules. Patient is due for screening chest CT in late July 2024. 2. Dilated 4.1 cm ascending thoracic aorta, which can also be reassessed on follow-up screening chest CT. 3.  Aortic Atherosclerosis (ICD10-I70.0). Electronically Signed   By: Ilona Sorrel M.D.   On: 03/27/2022 15:59   Result Date: 03/27/2022 CLINICAL DATA:  61 Year-old White Male EXAM: Cardiac/Coronary  CTA TECHNIQUE: The patient was scanned on a [employer]. FINDINGS:  Scan was triggered in the descending thoracic aorta. Axial non-contrast 3 mm slices were carried out through the heart. The data set was analyzed on a dedicated work station and scored using the [employer]. Gantry rotation speed was 250 msecs and collimation was .6 mm. 0.8 mg of sl NTG was given. The 3D data set was reconstructed in 5% intervals of the 67-82 % of the R-R cycle. Diastolic phases were analyzed on a dedicated work station using MPR, MIP and VRT modes. The patient received 100 cc of contrast. Coronary Arteries:  Normal coronary origin.  Right dominant. Coronary Calcium Score: Left main: 88 Left anterior descending artery: 360 Left circumflex artery: 53 Right coronary artery: 1 Total: 502 Percentile: 91st for age, sex, and race matched control. RCA is a large dominant artery that gives rise to PDA and PLA. There is complete absence of lumen opacification in the proximal-mid RCA consistent with CTO. Presence of distal flow may be consistent with retrograde collateral flow of to distal RCA. Left main is a large artery that gives rise to LAD and LCX arteries. Minimal distal calcified plaque (1-24%). LAD is a large vessel that gives rise to three diagonal vessels. Mild non-obstructive calcified plaque (25-49%) in the proximal LAD. Moderate calcified and mixed stenoses (50-70%) in mid LAD. Mild non-obstructive mixed plaque (25-49%) in the distal LAD. LCX is a non-dominant artery that gives rise to one large OM1 branch. Mild non-obstructive calcified plaques (25-49%) in the OM1. Other findings: Aorta: Mild dilation 41 mm. Scattered aortic atherosclerosis. No dissection. Main Pulmonary Artery: Normal size of the pulmonary artery. Systemic Veins: Normal drainage Aortic Valve:  Tri-leaflet.  Calcium score 15. Mitral valve: No calcifications Normal pulmonary vein drainage into the left atrium. Normal left atrial appendage without a thrombus. Interatrial septum with no communications  Left Ventricle: Normal size Left Atrium: Normal size Right Ventricle: Normal size Right Atrium: Normal size Pericardium: Normal thickness Extra-cardiac findings: See attached radiology report for non-cardiac structures. Artifact: Slab artifact IMPRESSION: 1. Coronary calcium score of 502. This was 91st percentile for age, sex, and race matched control. 2. Normal coronary origin with right dominance. 3. CAD-RADS 5 Total coronary occlusion (100%). CT-FFR will be sent, largely for mid LAD lesion. Consider symptom-guided anti-ischemic pharmacotherapy as well as risk factor modification per guideline directed care. 4. Mild aortic dilation. RECOMMENDATIONS: RECOMMENDATIONS The proposed cut-off value of 1,651 AU yielded a 93 % sensitivity and 75 % specificity in grading AS severity in patients with classical low-flow, low-gradient AS. Proposed different cut-off values to define severe AS for men and women as 2,065 AU and 1,274 AU, respectively. The joint European and American recommendations for the assessment of AS consider the aortic valve calcium score as a continuum - a very high calcium score suggests severe AS and a low calcium score suggests severe AS is unlikely. Kerman Passey, et al. 2017 ESC/EACTS Guidelines for the management of valvular heart disease. Eur Heart J 2017;38:2739-91. Coronary artery calcium (CAC) score is a strong predictor of incident coronary heart disease (CHD) and provides predictive information beyond traditional risk factors. CAC scoring is reasonable to use in the decision to withhold, postpone, or initiate statin therapy in intermediate-risk or selected borderline-risk asymptomatic adults (age 9-75 years and LDL-C >=70 to <190 mg/dL) who do not have diabetes or established atherosclerotic cardiovascular disease (ASCVD).* In intermediate-risk (10-year ASCVD risk >=7.5% to <20%) adults or selected borderline-risk (10-year ASCVD risk >=5% to <7.5%) adults in whom a CAC score  is measured for the purpose of making a treatment decision the following recommendations have been made: If CAC = 0, it is reasonable to withhold statin therapy and reassess in 5 to 10 years, as long as higher risk conditions are absent (diabetes mellitus, family history of premature CHD in first degree relatives (males <55 years; females <65 years), cigarette smoking, LDL >=190 mg/dL or other independent risk factors). If CAC is 1 to 99, it is reasonable to initiate statin therapy for patients >=14 years of age. If CAC is >=100 or >=75th percentile, it is reasonable to initiate statin therapy at any age. Cardiology referral should be considered for patients with CAC scores =400 or >=75th percentile. *2018 AHA/ACC/AACVPR/AAPA/ABC/ACPM/ADA/AGS/APhA/ASPC/NLA/PCNA Guideline on the Management of Blood Cholesterol: A Report of the American College of Cardiology/American Heart Association Task Force on Clinical Practice Guidelines. J Am Coll Cardiol. 2019;73(24):3168-3209. Rudean Haskell, MD Electronically Signed: By: Rudean Haskell M.D. On: 03/27/2022 15:17   CT CORONARY FFR DATA PREP & FLUID ANALYSIS  Result Date: 03/27/2022 EXAM: CT-FFR ANALYSIS CLINICAL DATA:  Possibly obstructive coronary lesion(s): Proximal- mid RCA; mid LAD, OM1 FINDINGS: CT-FFR analysis was performed on the original cardiac CT angiogram dataset. Diagrammatic representation of the CT-FFR analysis is provided in a separate PDF document in PACS. This dictation was created using the PDF document and an interactive 3D model of the results. 3D model is not available in the EMR/PACS. Normal FFR range is >0.80. 1. Left Main: No significant functional stenosis 2. LAD: No significant functional stenosis, CT-FFR 0.88 at LAD lesion. 3. LCX: No significant functional stenosis, CT-FFR 0.84 at OM1 lesion. 4. RCA: Modeled as a CTO at RCA lesion. IMPRESSION: 1.  CT FFR analysis shows evidence of a CTO RCA lesion. Rudean Haskell MD  Electronically Signed   By: Rudean Haskell M.D.   On: 03/27/2022 15:21   VAS US CAROTID  Result Date: 03/24/2022 Carotid Arterial Duplex Study Patient Name:  APOLINAR BERO [facility]  Date of Exam:   03/24/2022 Medical Rec #: [id number]          Accession #:    [id number] Date of Birth: 08/06/1960          Patient Gender: M Patient Age:   39 years Exam Location:  [facility] Procedure:      VAS US CAROTID Referring Phys: Jenkins Rouge --------------------------------------------------------------------------------  Indications:  Right bruit. Risk Factors: Hypertension, hyperlipidemia, Diabetes, past history of smoking,               PAD. Other         Patient denies any cerebrovascular symptoms. Factors: Performing Technologist: Wilkie Aye RVT  Examination Guidelines: A complete evaluation includes B-mode imaging, spectral Doppler, color Doppler, and power Doppler as needed of all accessible portions of each vessel. Bilateral testing is considered an integral part of a complete examination. Limited examinations for reoccurring indications may be performed as noted.  Right Carotid Findings: +---------+--------+-------+--------+------------------------+-----------------+          PSV cm/sEDV    StenosisPlaque Description      Comments                           cm/s                                                     +---------+--------+-------+--------+------------------------+-----------------+  CCA Prox 113     16                                                       +---------+--------+-------+--------+------------------------+-----------------+ CCA      56      10                                                       Distal                                                                    +---------+--------+-------+--------+------------------------+-----------------+ ICA Prox 285     101    60-79%  heterogenous and        High end of                                        irregular               range.            +---------+--------+-------+--------+------------------------+-----------------+ ICA Mid  260     70                                                       +---------+--------+-------+--------+------------------------+-----------------+ ICA      73      22                                                       Distal                                                                    +---------+--------+-------+--------+------------------------+-----------------+ ECA      97      15                                                       +---------+--------+-------+--------+------------------------+-----------------+ +----------+--------+-------+----------------+-------------------+           PSV cm/sEDV cmsDescribe        Arm Pressure (mmHG) +----------+--------+-------+----------------+-------------------+ [id number]            Multiphasic, [id number]                 +----------+--------+-------+----------------+-------------------+ +---------+--------+--+--------+-+---------+  VertebralPSV cm/s40EDV cm/s8Antegrade +---------+--------+--+--------+-+---------+  Left Carotid Findings: +----------+--------+--------+--------+--------------------------+--------+           PSV cm/sEDV cm/sStenosisPlaque Description        Comments +----------+--------+--------+--------+--------------------------+--------+ CCA Prox  114     20                                                 +----------+--------+--------+--------+--------------------------+--------+ CCA Distal83      21                                                 +----------+--------+--------+--------+--------------------------+--------+ ICA Prox  94      27              heterogenous and irregular         +----------+--------+--------+--------+--------------------------+--------+ ICA Mid   90      32      1-39%                                       +----------+--------+--------+--------+--------------------------+--------+ ICA Distal86      29                                                 +----------+--------+--------+--------+--------------------------+--------+ ECA       116     13                                                 +----------+--------+--------+--------+--------------------------+--------+ +----------+--------+--------+----------------+-------------------+           PSV cm/sEDV cm/sDescribe        Arm Pressure (mmHG) +----------+--------+--------+----------------+-------------------+ [id number]             Multiphasic, [id number]                 +----------+--------+--------+----------------+-------------------+ +---------+--------+--+--------+-+---------+ VertebralPSV cm/s32EDV cm/s8Antegrade +---------+--------+--+--------+-+---------+   Summary: Right Carotid: Velocities in the right ICA are consistent with a 60-79%                stenosis. Left Carotid: Velocities in the left ICA are consistent with a 1-39% stenosis. Vertebrals:  Bilateral vertebral arteries demonstrate antegrade flow. Subclavians: Normal flow hemodynamics were seen in bilateral subclavian              arteries. *See table(s) above for measurements and observations.  Suggest Peripheral Vascular Consult. Electronically signed by Jenkins Rouge MD on 03/24/2022 at 9:33:51 AM.    Final    VAS US RENAL ARTERY DUPLEX  Result Date: 03/24/2022 ABDOMINAL VISCERAL Patient Name:  DAWUD MAYS [facility]  Date of Exam:   03/24/2022 Medical Rec #: [id number]          Accession #:    [id number] Date of Birth: [birth year]          Patient Gender: M Patient Age:   52 years Exam Location:  [facility] Procedure:      VAS  US RENAL ARTERY DUPLEX Referring Phys: [id number] [place] -------------------------------------------------------------------------------- Indications: Uncontrolled HTN for 25 years per patient; denies abdominal pain High Risk Factors: Hypertension,  hyperlipidemia, Diabetes, past history of                    smoking. Performing Technologist: Wilkie Aye RVT  Examination Guidelines: A complete evaluation includes B-mode imaging, spectral Doppler, color Doppler, and power Doppler as needed of all accessible portions of each vessel. Bilateral testing is considered an integral part of a complete examination. Limited examinations for reoccurring indications may be performed as noted.  Duplex Findings: +-------------------+--------+-------+------+----------------------------------+ Mesenteric         PSV cm/s  EDV  Plaque             Comments                                          cm/s                                           +-------------------+--------+-------+------+----------------------------------+ Aorta Prox           132                                                   +-------------------+--------+-------+------+----------------------------------+ Aorta Distal          54                Saccular Dilatation 2.31 cm x 2.6                                                          cm                 +-------------------+--------+-------+------+----------------------------------+ Celiac Artery        172                                                   Origin                                                                     +-------------------+--------+-------+------+----------------------------------+ SMA Proximal         201                                                   +-------------------+--------+-------+------+----------------------------------+  +------------------+--------+--------+-------+ Right Renal ArteryPSV cm/sEDV cm/sComment +------------------+--------+--------+-------+ Origin  161      38           +------------------+--------+--------+-------+ Proximal            143      46           +------------------+--------+--------+-------+ Mid                  60       16           +------------------+--------+--------+-------+ Distal               78      17           +------------------+--------+--------+-------+ +-----------------+--------+--------+-----------------------------------------+ Left Renal ArteryPSV cm/sEDV cm/s                 Comment                  +-----------------+--------+--------+-----------------------------------------+                                  No flow detected in the left renal artery +-----------------+--------+--------+-----------------------------------------+  Technologist observations: RAR is unreliable due to elevated velocities in the proximal aorta. Unable to detect flow in the left renal artery and kidney. Cyst noted in left kidney measuring 3.2 cm x 2.9 cm. +------------+--------+--------+----+-----------+--------+--------+---+ Right KidneyPSV cm/sEDV cm/sRI  Left KidneyPSV cm/sEDV cm/sRI  +------------+--------+--------+----+-----------+--------+--------+---+ Upper Pole  17      6       0.65Upper Pole                     +------------+--------+--------+----+-----------+--------+--------+---+ Mid         28      11      0.62Mid                            +------------+--------+--------+----+-----------+--------+--------+---+ Lower Pole  25      10      0.60Lower Pole                     +------------+--------+--------+----+-----------+--------+--------+---+ Hilar       80      30      0.62Hilar                          +------------+--------+--------+----+-----------+--------+--------+---+ +------------------+-------+------------------+--------+ Right Kidney             Left Kidney                +------------------+-------+------------------+--------+ RAR (manual)      1.22   RAR (manual)               +------------------+-------+------------------+--------+ Cortex thickness  7.50 mmCorex thickness   11.80 mm  +------------------+-------+------------------+--------+ Kidney length (cm)12.15  Kidney length (cm)8.17     +------------------+-------+------------------+--------+  Summary: Largest Aortic Diameter: 2.6 cm  Renal:  Right: Normal size right kidney. Normal right Resisitive Index.        Normal cortical thickness of right kidney. RRV flow present.        No evidence of right renal artery stenosis. Left:  Abnormal cortical thickness of the left kidney.        No flow detected in kidney.        Left kidney is 3.98 cm smaller thant the right kidney. Mesenteric: Normal Celiac artery and Superior Mesenteric  artery findings. Patent IVC.  *See table(s) above for measurements and observations.  Suggest Peripheral Vascular Consult. Diagnosing physician: Jenkins Rouge MD  Electronically signed by Jenkins Rouge MD on 03/24/2022 at 9:32:50 AM.    Final     EKG: SR PR 214 LAD ICRBBB    ASSESSMENT AND PLAN:   Murmur:  mild/mod AR and AV sclerosis LV compensated control BP f/u TTE in 2 years CAD:  surprising CTO RCA on cardiac CTA with moderate LAD/LCX dx negative FFR but distal LAD 0.81 see reasoning above proceed with heart cath Hydrate hold ARB  HTN:  continue ACE, beta blocker, hydralazine and norvasc DASH diet HLD:  on Lipitor and zetia continue  DM:  Discussed low carb diet.  Target hemoglobin A1c is 6.5 or less.  Continue current medications.  PVD. Moderate reduction in ABI's continue Pletal f/u VVS 60-79% RICA stenosis on duplex Sees Dr Trula Slade Emphysema:  quit smoking lung cancer CT ok 10/23/21 f/u Pulmonary Eric Form CRF:  Cr 1.53 f/u nephrology continue ACE given PVD Renal duplex 03/24/22 showed no flow to left kidney To see Dr Carolin Sicks Abnormal eCG:: evidence for conduction dx with first degree LAD and ICRBBB Yearly ECG   Carotid duplex June 2024 F/U pulmonary Eric Form F/U Renal Dr Carolin Sicks F/U VVS Dr Trula Slade  Pre cath orders BMET/CBC Hold Lisinopril day before and morning of cath [facility] on  pre cath orders   F/U cardiology post cath  Signed: Jenkins Rouge 03/28/2022, 12:59 PM

## 2022-03-28 NOTE — H&P (View-Only) (Signed)
CARDIOLOGY CONSULT NOTE       Patient ID: Jacob Arnold MRN: [id number] DOB/AGE: Aug 16, 1960 61 y.o.  Admit date: (Not on file) Referring Physician: [name] Primary Physician: [name], [name], MD Primary Cardiologist: [name]   HPI:  60 y.o. referred by Dr [name] for murmur. Initially seen 03/09/22 History of prior smoking, HTN, HLD, DM-2 CRF and PAD. 37 pack year history of smoking Quit about 2 years ago Lung cancer CT 11/11/21 no cancer. Aortic atherosclerosis and emphysema Ascending thoracic aort 4.2 cm Note made of coronary calcium   TTE 01/26/22 reviewed EF 55-60% mild LVH no LVE mild/mod AR Aorta 4.1 cm  ABI 02/28/21 right 0.62 and left 0.76  He has [facility] appointment coming up   Most recent labs show Cr 1.85 K 4.3 A1c 6 LDL 79   No chest pain. Claudication in both calves can walk through and pletal has helped No ulcers Or resting pain   Works in Pharmacologist From [place]  met wife at [employer] 3 older children in [place] area   Cardiac CTA reviewed from 03/27/22 Calcium score 502 91 st percentile for age/sex RCA total occlusion in mid vessel with distal vessel filling from ? Collaterals Mid LAD 50-70% and LCX 25-49% with negative FFR in these vessels   We also noted 60-79% RICA stenosis on duplex 03/24/22 Occluded/no flow to left kidney on duplex same day  He has exertional dyspnea We discussed doing a cath to further understand the morphology of his RCA occlusion to see if is a short segment occlusion that could be opened in future if needed. Also I have concern to make sure the LAD lesion is not worse as the FFR in this area was 0.81 distally His collaterals likely come from LAD and I would like to make sure this is not flow limiting   His claudication is mild and stable He sees brabham  He has f/u with renal and we discussed functionally single kidney Will hold ACE/ARB prior to cath and hydrate   Shared Decision Making/Informed Consent The risks [stroke (1 in  1000), death (1 in 1000), kidney failure [usually temporary] (1 in 500), bleeding (1 in 200), allergic reaction [possibly serious] (1 in 200)], benefits (diagnostic support and management of coronary artery disease) and alternatives of a cardiac catheterization were discussed in detail with Jacob Arnold and she is willing to proceed.   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)    DM (diabetes mellitus) ([place])    ED (erectile dysfunction)    Elevated TSH    Hyperglycemia    HYPERLIPIDEMIA    HYPERTENSION    Panic attacks    Systolic murmur    Tobacco abuse    Vitamin D deficiency     Family History  Problem Relation Age of Onset   Breast cancer Mother    Liver cancer Mother    Diabetes Father    Hyperlipidemia Neg Hx        family hx   Heart disease Neg Hx        family hx   Hypertension Neg Hx        family hx   Cancer Neg Hx        family hx    Social History   Socioeconomic History   Marital status: Married    Spouse name: Not on file  Number of children: Not on file   Years of education: Not on file   Highest education level: Not on file  Occupational History   Not on file  Tobacco Use   Smoking status: Former    Packs/day: 1.00    Years: 37.00    Total pack years: 37.00    Types: Cigarettes    Quit date: 2022    Years since quitting: 1.9   Smokeless tobacco: Never   Tobacco comments:    Quit 2022  Vaping Use   Vaping Use: Never used  Substance and Sexual Activity   Alcohol use: Yes    Alcohol/week: 6.0 standard drinks of alcohol    Types: 6 Cans of beer per week   Drug use: No   Sexual activity: Not on file  Other Topics Concern   Not on file  Social History Narrative   Not on file   Social Determinants of Health   Financial Resource Strain: Unknown (01/22/2022)   Overall Financial Resource Strain (CARDIA)    Difficulty of  Paying Living Expenses: Patient refused  Food Insecurity: Unknown (01/22/2022)   Hunger Vital Sign    Worried About Running Out of Food in the Last Year: Patient refused    [place] in the Last Year: Patient refused  Transportation Needs: Unknown (01/22/2022)   PRAPARE - Hydrologist (Medical): Patient refused    Lack of Transportation (Non-Medical): Patient refused  Physical Activity: Not on file  Stress: Not on file  Social Connections: Unknown (01/22/2022)   Social Connection and Isolation Panel [NHANES]    Frequency of Communication with Friends and Family: Not on file    Frequency of Social Gatherings with Friends and Family: Not on file    Attends Religious Services: Not on file    Active Member of Clubs or Organizations: Patient refused    Attends Archivist Meetings: Not on file    Marital Status: Patient refused  Intimate Partner Violence: Not on file    Past Surgical History:  Procedure Laterality Date   TONSILLECTOMY     VASECTOMY        Current Outpatient Medications:    amLODipine (NORVASC) 10 MG tablet, TAKE 1 TABLET BY MOUTH EVERY DAY, Disp: 90 tablet, Rfl: 1   aspirin 81 MG tablet, Take 1 tablet (81 mg total) by mouth daily., Disp: 30 tablet, Rfl:    atorvastatin (LIPITOR) 80 MG tablet, TAKE 1 TABLET BY MOUTH EVERY DAY, Disp: 90 tablet, Rfl: 1   cilostazol (PLETAL) 100 MG tablet, TAKE 1 TABLET BY MOUTH TWICE A DAY BEFORE MEALS, Disp: 180 tablet, Rfl: 2   ezetimibe (ZETIA) 10 MG tablet, TAKE 1 TABLET BY MOUTH EVERY DAY, Disp: 90 tablet, Rfl: 1   hydrALAZINE (APRESOLINE) 50 MG tablet, TAKE 1.5 TABLETS (75 MG TOTAL) BY MOUTH 3 (THREE) TIMES DAILY., Disp: 405 tablet, Rfl: 1   labetalol (NORMODYNE) 200 MG tablet, TAKE 2 TABLETS BY MOUTH 2 TIMES DAILY., Disp: 360 tablet, Rfl: 1   lisinopril (ZESTRIL) 10 MG tablet, TAKE 1 TABLET BY MOUTH EVERY DAY, Disp: 90 tablet, Rfl: 0    Physical Exam: There were no vitals taken for this  visit.    Affect appropriate Healthy:  appears stated age 80: normal Neck supple with no adenopathy JVP normal right bruits no thyromegaly Lungs clear with no wheezing and good diaphragmatic motion Heart:  S1/S2 SEM  murmur, no rub, gallop or click PMI normal Abdomen: benighn,  BS positve, no tenderness, no AAA no bruit.  No HSM or HJR Distal pulses diminished left pedal  No edema Neuro non-focal Skin warm and dry No muscular weakness   Labs:   Lab Results  Component Value Date   WBC 9.2 09/01/2021   HGB 14.5 09/01/2021   HCT 42.8 09/01/2021   MCV 96.0 09/01/2021   PLT 216.0 09/01/2021   No results for input(s): "NA", "K", "CL", "CO2", "BUN", "CREATININE", "CALCIUM", "PROT", "BILITOT", "ALKPHOS", "ALT", "AST", "GLUCOSE" in the last 168 hours.  Invalid input(s): "LABALBU" Lab Results  Component Value Date   TROPONINI <0.30 10/04/2011    Lab Results  Component Value Date   CHOL 145 09/01/2021   CHOL 171 01/28/2021   CHOL 238 (H) 01/20/2020   Lab Results  Component Value Date   HDL 37.30 (L) 09/01/2021   HDL 37.70 (L) 01/28/2021   HDL 35 (L) 01/20/2020   Lab Results  Component Value Date   LDLCALC 79 09/01/2021   [place]  01/20/2020     Comment:     . LDL cholesterol not calculated. Triglyceride levels greater than 400 mg/dL invalidate calculated LDL results. . Reference range: <100 . Desirable range <100 mg/dL for primary prevention;   <70 mg/dL for patients with CHD or diabetic patients  with > or = 2 CHD risk factors. Marland Kitchen LDL-C is now calculated using the Martin-Hopkins  calculation, which is a validated novel method providing  better accuracy than the Friedewald equation in the  estimation of LDL-C.  Cresenciano Genre et al. Annamaria Helling. 1601;093(23): 2061-2068  (http://education.[facility].com/faq/FAQ164)    Lab Results  Component Value Date   TRIG 143.0 09/01/2021   TRIG 348.0 (H) 01/28/2021   TRIG 726 (H) 01/20/2020   Lab Results  Component  Value Date   CHOLHDL 4 09/01/2021   CHOLHDL 5 01/28/2021   CHOLHDL 6.8 (H) 01/20/2020   Lab Results  Component Value Date   LDLDIRECT 73.0 01/28/2021   LDLDIRECT 93.0 05/28/2018   LDLDIRECT 95.0 05/09/2017      Radiology: CT CORONARY MORPH W/CTA COR W/SCORE W/CA W/CM &/OR WO/CM  Addendum Date: 03/27/2022   ADDENDUM REPORT: 03/27/2022 15:59 EXAM: OVER-READ INTERPRETATION  CT CHEST The following report is an over-read performed by radiologist Dr. Samara Snide [facility] on 03/27/2022. This over-read does not include interpretation of cardiac or coronary anatomy or pathology. The coronary CTA interpretation by the cardiologist is attached. COMPARISON:  11/11/2021 screening chest CT. FINDINGS: Cardiovascular: Normal heart size. No significant pericardial effusion/thickening. Atherosclerotic thoracic aorta with dilated 4.1 cm ascending thoracic aorta. Normal caliber pulmonary arteries. No central pulmonary emboli. Mediastinum/Nodes: Unremarkable esophagus. No pathologically enlarged mediastinal or hilar lymph nodes. Lungs/Pleura: No pneumothorax. No pleural effusion. No acute consolidative airspace disease or lung masses. Left upper lobe solid 0.4 cm pulmonary nodule (series 11/image 1), unchanged from 11/11/2021 CT. No new significant pulmonary nodules. Upper abdomen: No acute abnormality. Musculoskeletal:  No aggressive appearing focal osseous lesions. IMPRESSION: 1. Left upper lobe solid 0.4 cm pulmonary nodule, unchanged from 11/11/2021 CT. No new significant pulmonary nodules. Patient is due for screening chest CT in late July 2024. 2. Dilated 4.1 cm ascending thoracic aorta, which can also be reassessed on follow-up screening chest CT. 3.  Aortic Atherosclerosis (ICD10-I70.0). Electronically Signed   By: Ilona Sorrel M.D.   On: 03/27/2022 15:59   Result Date: 03/27/2022 CLINICAL DATA:  61 Year-old White Male EXAM: Cardiac/Coronary  CTA TECHNIQUE: The patient was scanned on a [employer]. FINDINGS:  Scan was triggered in the descending thoracic aorta. Axial non-contrast 3 mm slices were carried out through the heart. The data set was analyzed on a dedicated work station and scored using the [employer]. Gantry rotation speed was 250 msecs and collimation was .6 mm. 0.8 mg of sl NTG was given. The 3D data set was reconstructed in 5% intervals of the 67-82 % of the R-R cycle. Diastolic phases were analyzed on a dedicated work station using MPR, MIP and VRT modes. The patient received 100 cc of contrast. Coronary Arteries:  Normal coronary origin.  Right dominant. Coronary Calcium Score: Left main: 88 Left anterior descending artery: 360 Left circumflex artery: 53 Right coronary artery: 1 Total: 502 Percentile: 91st for age, sex, and race matched control. RCA is a large dominant artery that gives rise to PDA and PLA. There is complete absence of lumen opacification in the proximal-mid RCA consistent with CTO. Presence of distal flow may be consistent with retrograde collateral flow of to distal RCA. Left main is a large artery that gives rise to LAD and LCX arteries. Minimal distal calcified plaque (1-24%). LAD is a large vessel that gives rise to three diagonal vessels. Mild non-obstructive calcified plaque (25-49%) in the proximal LAD. Moderate calcified and mixed stenoses (50-70%) in mid LAD. Mild non-obstructive mixed plaque (25-49%) in the distal LAD. LCX is a non-dominant artery that gives rise to one large OM1 branch. Mild non-obstructive calcified plaques (25-49%) in the OM1. Other findings: Aorta: Mild dilation 41 mm. Scattered aortic atherosclerosis. No dissection. Main Pulmonary Artery: Normal size of the pulmonary artery. Systemic Veins: Normal drainage Aortic Valve:  Tri-leaflet.  Calcium score 15. Mitral valve: No calcifications Normal pulmonary vein drainage into the left atrium. Normal left atrial appendage without a thrombus. Interatrial septum with no communications  Left Ventricle: Normal size Left Atrium: Normal size Right Ventricle: Normal size Right Atrium: Normal size Pericardium: Normal thickness Extra-cardiac findings: See attached radiology report for non-cardiac structures. Artifact: Slab artifact IMPRESSION: 1. Coronary calcium score of 502. This was 91st percentile for age, sex, and race matched control. 2. Normal coronary origin with right dominance. 3. CAD-RADS 5 Total coronary occlusion (100%). CT-FFR will be sent, largely for mid LAD lesion. Consider symptom-guided anti-ischemic pharmacotherapy as well as risk factor modification per guideline directed care. 4. Mild aortic dilation. RECOMMENDATIONS: RECOMMENDATIONS The proposed cut-off value of 1,651 AU yielded a 93 % sensitivity and 75 % specificity in grading AS severity in patients with classical low-flow, low-gradient AS. Proposed different cut-off values to define severe AS for men and women as 2,065 AU and 1,274 AU, respectively. The joint European and American recommendations for the assessment of AS consider the aortic valve calcium score as a continuum - a very high calcium score suggests severe AS and a low calcium score suggests severe AS is unlikely. Kerman Passey, et al. 2017 ESC/EACTS Guidelines for the management of valvular heart disease. Eur Heart J 2017;38:2739-91. Coronary artery calcium (CAC) score is a strong predictor of incident coronary heart disease (CHD) and provides predictive information beyond traditional risk factors. CAC scoring is reasonable to use in the decision to withhold, postpone, or initiate statin therapy in intermediate-risk or selected borderline-risk asymptomatic adults (age 9-75 years and LDL-C >=70 to <190 mg/dL) who do not have diabetes or established atherosclerotic cardiovascular disease (ASCVD).* In intermediate-risk (10-year ASCVD risk >=7.5% to <20%) adults or selected borderline-risk (10-year ASCVD risk >=5% to <7.5%) adults in whom a CAC score  is measured for the purpose of making a treatment decision the following recommendations have been made: If CAC = 0, it is reasonable to withhold statin therapy and reassess in 5 to 10 years, as long as higher risk conditions are absent (diabetes mellitus, family history of premature CHD in first degree relatives (males <55 years; females <65 years), cigarette smoking, LDL >=190 mg/dL or other independent risk factors). If CAC is 1 to 99, it is reasonable to initiate statin therapy for patients >=14 years of age. If CAC is >=100 or >=75th percentile, it is reasonable to initiate statin therapy at any age. Cardiology referral should be considered for patients with CAC scores =400 or >=75th percentile. *2018 AHA/ACC/AACVPR/AAPA/ABC/ACPM/ADA/AGS/APhA/ASPC/NLA/PCNA Guideline on the Management of Blood Cholesterol: A Report of the American College of Cardiology/American Heart Association Task Force on Clinical Practice Guidelines. J Am Coll Cardiol. 2019;73(24):3168-3209. Rudean Haskell, MD Electronically Signed: By: Rudean Haskell M.D. On: 03/27/2022 15:17   CT CORONARY FFR DATA PREP & FLUID ANALYSIS  Result Date: 03/27/2022 EXAM: CT-FFR ANALYSIS CLINICAL DATA:  Possibly obstructive coronary lesion(s): Proximal- mid RCA; mid LAD, OM1 FINDINGS: CT-FFR analysis was performed on the original cardiac CT angiogram dataset. Diagrammatic representation of the CT-FFR analysis is provided in a separate PDF document in PACS. This dictation was created using the PDF document and an interactive 3D model of the results. 3D model is not available in the EMR/PACS. Normal FFR range is >0.80. 1. Left Main: No significant functional stenosis 2. LAD: No significant functional stenosis, CT-FFR 0.88 at LAD lesion. 3. LCX: No significant functional stenosis, CT-FFR 0.84 at OM1 lesion. 4. RCA: Modeled as a CTO at RCA lesion. IMPRESSION: 1.  CT FFR analysis shows evidence of a CTO RCA lesion. Rudean Haskell MD  Electronically Signed   By: Rudean Haskell M.D.   On: 03/27/2022 15:21   VAS US CAROTID  Result Date: 03/24/2022 Carotid Arterial Duplex Study Patient Name:  APOLINAR BERO [facility]  Date of Exam:   03/24/2022 Medical Rec #: [id number]          Accession #:    [id number] Date of Birth: 08/06/1960          Patient Gender: M Patient Age:   39 years Exam Location:  [facility] Procedure:      VAS US CAROTID Referring Phys: Jenkins Rouge --------------------------------------------------------------------------------  Indications:  Right bruit. Risk Factors: Hypertension, hyperlipidemia, Diabetes, past history of smoking,               PAD. Other         Patient denies any cerebrovascular symptoms. Factors: Performing Technologist: Wilkie Aye RVT  Examination Guidelines: A complete evaluation includes B-mode imaging, spectral Doppler, color Doppler, and power Doppler as needed of all accessible portions of each vessel. Bilateral testing is considered an integral part of a complete examination. Limited examinations for reoccurring indications may be performed as noted.  Right Carotid Findings: +---------+--------+-------+--------+------------------------+-----------------+          PSV cm/sEDV    StenosisPlaque Description      Comments                           cm/s                                                     +---------+--------+-------+--------+------------------------+-----------------+  CCA Prox 113     16                                                       +---------+--------+-------+--------+------------------------+-----------------+ CCA      56      10                                                       Distal                                                                    +---------+--------+-------+--------+------------------------+-----------------+ ICA Prox 285     101    60-79%  heterogenous and        High end of                                        irregular               range.            +---------+--------+-------+--------+------------------------+-----------------+ ICA Mid  260     70                                                       +---------+--------+-------+--------+------------------------+-----------------+ ICA      73      22                                                       Distal                                                                    +---------+--------+-------+--------+------------------------+-----------------+ ECA      97      15                                                       +---------+--------+-------+--------+------------------------+-----------------+ +----------+--------+-------+----------------+-------------------+           PSV cm/sEDV cmsDescribe        Arm Pressure (mmHG) +----------+--------+-------+----------------+-------------------+ [id number]            Multiphasic, [id number]                 +----------+--------+-------+----------------+-------------------+ +---------+--------+--+--------+-+---------+  VertebralPSV cm/s40EDV cm/s8Antegrade +---------+--------+--+--------+-+---------+  Left Carotid Findings: +----------+--------+--------+--------+--------------------------+--------+           PSV cm/sEDV cm/sStenosisPlaque Description        Comments +----------+--------+--------+--------+--------------------------+--------+ CCA Prox  114     20                                                 +----------+--------+--------+--------+--------------------------+--------+ CCA Distal83      21                                                 +----------+--------+--------+--------+--------------------------+--------+ ICA Prox  94      27              heterogenous and irregular         +----------+--------+--------+--------+--------------------------+--------+ ICA Mid   90      32      1-39%                                       +----------+--------+--------+--------+--------------------------+--------+ ICA Distal86      29                                                 +----------+--------+--------+--------+--------------------------+--------+ ECA       116     13                                                 +----------+--------+--------+--------+--------------------------+--------+ +----------+--------+--------+----------------+-------------------+           PSV cm/sEDV cm/sDescribe        Arm Pressure (mmHG) +----------+--------+--------+----------------+-------------------+ [id number]             Multiphasic, [id number]                 +----------+--------+--------+----------------+-------------------+ +---------+--------+--+--------+-+---------+ VertebralPSV cm/s32EDV cm/s8Antegrade +---------+--------+--+--------+-+---------+   Summary: Right Carotid: Velocities in the right ICA are consistent with a 60-79%                stenosis. Left Carotid: Velocities in the left ICA are consistent with a 1-39% stenosis. Vertebrals:  Bilateral vertebral arteries demonstrate antegrade flow. Subclavians: Normal flow hemodynamics were seen in bilateral subclavian              arteries. *See table(s) above for measurements and observations.  Suggest Peripheral Vascular Consult. Electronically signed by Jenkins Rouge MD on 03/24/2022 at 9:33:51 AM.    Final    VAS US RENAL ARTERY DUPLEX  Result Date: 03/24/2022 ABDOMINAL VISCERAL Patient Name:  DAWUD MAYS [facility]  Date of Exam:   03/24/2022 Medical Rec #: [id number]          Accession #:    [id number] Date of Birth: [birth year]          Patient Gender: M Patient Age:   52 years Exam Location:  [facility] Procedure:      VAS  US RENAL ARTERY DUPLEX Referring Phys: [id number] [place] -------------------------------------------------------------------------------- Indications: Uncontrolled HTN for 25 years per patient; denies abdominal pain High Risk Factors: Hypertension,  hyperlipidemia, Diabetes, past history of                    smoking. Performing Technologist: Wilkie Aye RVT  Examination Guidelines: A complete evaluation includes B-mode imaging, spectral Doppler, color Doppler, and power Doppler as needed of all accessible portions of each vessel. Bilateral testing is considered an integral part of a complete examination. Limited examinations for reoccurring indications may be performed as noted.  Duplex Findings: +-------------------+--------+-------+------+----------------------------------+ Mesenteric         PSV cm/s  EDV  Plaque             Comments                                          cm/s                                           +-------------------+--------+-------+------+----------------------------------+ Aorta Prox           132                                                   +-------------------+--------+-------+------+----------------------------------+ Aorta Distal          54                Saccular Dilatation 2.31 cm x 2.6                                                          cm                 +-------------------+--------+-------+------+----------------------------------+ Celiac Artery        172                                                   Origin                                                                     +-------------------+--------+-------+------+----------------------------------+ SMA Proximal         201                                                   +-------------------+--------+-------+------+----------------------------------+  +------------------+--------+--------+-------+ Right Renal ArteryPSV cm/sEDV cm/sComment +------------------+--------+--------+-------+ Origin  161      38           +------------------+--------+--------+-------+ Proximal            143      46           +------------------+--------+--------+-------+ Mid                  60       16           +------------------+--------+--------+-------+ Distal               78      17           +------------------+--------+--------+-------+ +-----------------+--------+--------+-----------------------------------------+ Left Renal ArteryPSV cm/sEDV cm/s                 Comment                  +-----------------+--------+--------+-----------------------------------------+                                  No flow detected in the left renal artery +-----------------+--------+--------+-----------------------------------------+  Technologist observations: RAR is unreliable due to elevated velocities in the proximal aorta. Unable to detect flow in the left renal artery and kidney. Cyst noted in left kidney measuring 3.2 cm x 2.9 cm. +------------+--------+--------+----+-----------+--------+--------+---+ Right KidneyPSV cm/sEDV cm/sRI  Left KidneyPSV cm/sEDV cm/sRI  +------------+--------+--------+----+-----------+--------+--------+---+ Upper Pole  17      6       0.65Upper Pole                     +------------+--------+--------+----+-----------+--------+--------+---+ Mid         28      11      0.62Mid                            +------------+--------+--------+----+-----------+--------+--------+---+ Lower Pole  25      10      0.60Lower Pole                     +------------+--------+--------+----+-----------+--------+--------+---+ Hilar       80      30      0.62Hilar                          +------------+--------+--------+----+-----------+--------+--------+---+ +------------------+-------+------------------+--------+ Right Kidney             Left Kidney                +------------------+-------+------------------+--------+ RAR (manual)      1.22   RAR (manual)               +------------------+-------+------------------+--------+ Cortex thickness  7.50 mmCorex thickness   11.80 mm  +------------------+-------+------------------+--------+ Kidney length (cm)12.15  Kidney length (cm)8.17     +------------------+-------+------------------+--------+  Summary: Largest Aortic Diameter: 2.6 cm  Renal:  Right: Normal size right kidney. Normal right Resisitive Index.        Normal cortical thickness of right kidney. RRV flow present.        No evidence of right renal artery stenosis. Left:  Abnormal cortical thickness of the left kidney.        No flow detected in kidney.        Left kidney is 3.98 cm smaller thant the right kidney. Mesenteric: Normal Celiac artery and Superior Mesenteric  artery findings. Patent IVC.  *See table(s) above for measurements and observations.  Suggest Peripheral Vascular Consult. Diagnosing physician: Jenkins Rouge MD  Electronically signed by Jenkins Rouge MD on 03/24/2022 at 9:32:50 AM.    Final     EKG: SR PR 214 LAD ICRBBB    ASSESSMENT AND PLAN:   Murmur:  mild/mod AR and AV sclerosis LV compensated control BP f/u TTE in 2 years CAD:  surprising CTO RCA on cardiac CTA with moderate LAD/LCX dx negative FFR but distal LAD 0.81 see reasoning above proceed with heart cath Hydrate hold ARB  HTN:  continue ACE, beta blocker, hydralazine and norvasc DASH diet HLD:  on Lipitor and zetia continue  DM:  Discussed low carb diet.  Target hemoglobin A1c is 6.5 or less.  Continue current medications.  PVD. Moderate reduction in ABI's continue Pletal f/u VVS 60-79% RICA stenosis on duplex Sees Dr Trula Slade Emphysema:  quit smoking lung cancer CT ok 10/23/21 f/u Pulmonary Eric Form CRF:  Cr 1.53 f/u nephrology continue ACE given PVD Renal duplex 03/24/22 showed no flow to left kidney To see Dr Carolin Sicks Abnormal eCG:: evidence for conduction dx with first degree LAD and ICRBBB Yearly ECG   Carotid duplex June 2024 F/U pulmonary Eric Form F/U Renal Dr Carolin Sicks F/U VVS Dr Trula Slade  Pre cath orders BMET/CBC Hold Lisinopril day before and morning of cath [facility] on  pre cath orders   F/U cardiology post cath  Signed: Jenkins Rouge 03/28/2022, 12:59 PM

## 2022-04-04 ENCOUNTER — Encounter: Payer: Self-pay | Admitting: Cardiovascular Disease

## 2022-04-04 ENCOUNTER — Other Ambulatory Visit: Payer: Self-pay | Admitting: Cardiovascular Disease

## 2022-04-04 ENCOUNTER — Encounter: Payer: Self-pay | Admitting: Internal Medicine

## 2022-04-04 ENCOUNTER — Ambulatory Visit: Payer: BC Managed Care – PPO | Attending: Cardiovascular Disease | Admitting: Cardiovascular Disease

## 2022-04-04 VITALS — BP 126/68 | HR 66 | Ht 70.0 in | Wt 186.4 lb

## 2022-04-04 DIAGNOSIS — I739 Peripheral vascular disease, unspecified: Secondary | ICD-10-CM

## 2022-04-04 DIAGNOSIS — R0609 Other forms of dyspnea: Secondary | ICD-10-CM

## 2022-04-04 DIAGNOSIS — R079 Chest pain, unspecified: Secondary | ICD-10-CM

## 2022-04-04 DIAGNOSIS — I150 Renovascular hypertension: Secondary | ICD-10-CM | POA: Diagnosis not present

## 2022-04-04 DIAGNOSIS — I2584 Coronary atherosclerosis due to calcified coronary lesion: Secondary | ICD-10-CM

## 2022-04-04 DIAGNOSIS — E782 Mixed hyperlipidemia: Secondary | ICD-10-CM

## 2022-04-04 DIAGNOSIS — I2510 Atherosclerotic heart disease of native coronary artery without angina pectoris: Secondary | ICD-10-CM

## 2022-04-04 DIAGNOSIS — R0989 Other specified symptoms and signs involving the circulatory and respiratory systems: Secondary | ICD-10-CM

## 2022-04-04 MED ORDER — SODIUM CHLORIDE 0.9% FLUSH: 3.0000 mL | Freq: Two times a day (BID) | INTRAVENOUS | Status: AC

## 2022-04-04 NOTE — H&P (View-Only) (Signed)
x

## 2022-04-04 NOTE — Patient Instructions (Addendum)
Medication Instructions:  Your physician recommends that you continue on your current medications as directed. Please refer to the Current Medication list given to you today.  *If you need a refill on your cardiac medications before your next appointment, please call your pharmacy*  Lab Work: Your physician recommends that you return for lab work next Monday for BMET and CBC.  If you have labs (blood work) drawn today and your tests are completely normal, you will receive your results only by: [place] (if you have MyChart) OR A paper copy in the mail If you have any lab test that is abnormal or we need to change your treatment, we will call you to review the results.  Testing/Procedures: Your physician has requested that you have a cardiac catheterization. Cardiac catheterization is used to diagnose and/or treat various heart conditions. Doctors may recommend this procedure for a number of different reasons. The most common reason is to evaluate chest pain. Chest pain can be a symptom of coronary artery disease (CAD), and cardiac catheterization can show whether plaque is narrowing or blocking your heart's arteries. This procedure is also used to evaluate the valves, as well as measure the blood flow and oxygen levels in different parts of your heart. For further information please visit [email or web address]. Please follow instruction sheet, as given.  Follow-Up: At [facility], you and your health needs are our priority.  As part of our continuing mission to provide you with exceptional heart care, we have created designated Provider Care Teams.  These Care Teams include your primary Cardiologist (physician) and Advanced Practice Providers (APPs -  Physician Assistants and Nurse Practitioners) who all work together to provide you with the care you need, when you need it.  We recommend signing up for the patient portal called "MyChart".  Sign up information is provided on this  After Visit Summary.  MyChart is used to connect with patients for Virtual Visits (Telemedicine).  Patients are able to view lab/test results, encounter notes, upcoming appointments, etc.  Non-urgent messages can be sent to your provider as well.   To learn more about what you can do with MyChart, go to [email or web address].    Your next appointment:   3 week(s)  The format for your next appointment:   In Person  Provider:   Jenkins Rouge, MD   or  PA/NP  Other Instructions . [place] A DEPT OF [place] A DEPT OF [facility] [place], [place] [id number] [place] [address] Dept: [phone] Loc: [place]  04/04/2022  You are scheduled for a Cardiac Catheterization on Wednesday, December 20 with Dr. Daneen Schick.  1. Please arrive at the [facility] (Main Entrance A) at [facility]: [address] [place], [place] [address] at 9:00 AM (This time is two hours before your procedure to ensure your preparation). Free valet parking service is available.   Special note: Every effort is made to have your procedure done on time. Please understand that emergencies sometimes delay scheduled procedures.  2. Diet: Do not eat solid foods after midnight.  The patient may have clear liquids until 5am upon the day of the procedure.  3. Labs: You will need to have blood drawn on Monday, December 18 at [facility] at [facility]. [address]  Open: 7:30am - 5pm    Phone: [phone]. You do  not need to be fasting.  4. Medication instructions in preparation for your procedure:   Contrast Allergy: No  Stop taking, Lisinopril (Zestril or Prinivil) Tuesday, December 19,   On the morning of your procedure, take your Aspirin 81 mg and any morning medicines NOT listed above.  You may use sips of water.  5. Plan for one night  stay--bring personal belongings. 6. Bring a current list of your medications and current insurance cards. 7. You MUST have a responsible person to drive you home. 8. Someone MUST be with you the first 24 hours after you arrive home or your discharge will be delayed. 9. Please wear clothes that are easy to get on and off and wear slip-on shoes.  Thank you for allowing [place] to care for you!   -- [facility]   Important Information About Sugar

## 2022-04-04 NOTE — Progress Notes (Signed)
x

## 2022-04-05 ENCOUNTER — Telehealth: Payer: Self-pay | Admitting: Cardiovascular Disease

## 2022-04-05 NOTE — Telephone Encounter (Signed)
Called patient back. Patient stated [facility] stated they did not get records, but they called patient back and verified they did receive records. Patient thanked me for the call back.

## 2022-04-05 NOTE — Telephone Encounter (Signed)
Pt calling in regards to having Renal Scan results sent to [facility]. Please advise

## 2022-04-10 ENCOUNTER — Ambulatory Visit: Payer: BC Managed Care – PPO | Attending: Cardiovascular Disease

## 2022-04-10 DIAGNOSIS — I2510 Atherosclerotic heart disease of native coronary artery without angina pectoris: Secondary | ICD-10-CM

## 2022-04-10 DIAGNOSIS — E782 Mixed hyperlipidemia: Secondary | ICD-10-CM

## 2022-04-10 DIAGNOSIS — I739 Peripheral vascular disease, unspecified: Secondary | ICD-10-CM | POA: Diagnosis not present

## 2022-04-10 DIAGNOSIS — R0989 Other specified symptoms and signs involving the circulatory and respiratory systems: Secondary | ICD-10-CM | POA: Diagnosis not present

## 2022-04-10 DIAGNOSIS — I150 Renovascular hypertension: Secondary | ICD-10-CM | POA: Diagnosis not present

## 2022-04-10 DIAGNOSIS — R0609 Other forms of dyspnea: Secondary | ICD-10-CM

## 2022-04-11 ENCOUNTER — Telehealth: Payer: Self-pay | Admitting: *Deleted

## 2022-04-11 LAB — BASIC METABOLIC PANEL
BUN/Creatinine Ratio: 13 (ref 10–24)
BUN: 19 mg/dL (ref 8–27)
CO2: 17 mmol/L — ABNORMAL LOW (ref 20–29)
Calcium: 9.6 mg/dL (ref 8.6–10.2)
Chloride: 106 mmol/L (ref 96–106)
Creatinine, Ser: 1.5 mg/dL — ABNORMAL HIGH (ref 0.76–1.27)
Glucose: 133 mg/dL — ABNORMAL HIGH (ref 70–99)
Potassium: 4.9 mmol/L (ref 3.5–5.2)
Sodium: 138 mmol/L (ref 134–144)
eGFR: 53 mL/min/{1.73_m2} — ABNORMAL LOW (ref >59)

## 2022-04-11 LAB — CBC WITH DIFFERENTIAL/PLATELET
Basophils Absolute: 0.1 10*3/uL (ref 0.0–0.2)
Basos: 1 %
EOS (ABSOLUTE): 0.6 10*3/uL — ABNORMAL HIGH (ref 0.0–0.4)
Eos: 7 %
Hematocrit: 46.8 % (ref 37.5–51.0)
Hemoglobin: 16.2 g/dL (ref 13.0–17.7)
Immature Grans (Abs): 0 10*3/uL (ref 0.0–0.1)
Immature Granulocytes: 1 %
Lymphocytes Absolute: 2.8 10*3/uL (ref 0.7–3.1)
Lymphs: 33 %
MCH: 31.7 pg (ref 26.6–33.0)
MCHC: 34.6 g/dL (ref 31.5–35.7)
MCV: 92 fL (ref 79–97)
Monocytes Absolute: 0.7 10*3/uL (ref 0.1–0.9)
Monocytes: 8 %
Neutrophils Absolute: 4.3 10*3/uL (ref 1.4–7.0)
Neutrophils: 50 %
Platelets: 246 10*3/uL (ref 150–450)
RBC: 5.11 x10E6/uL (ref 4.14–5.80)
RDW: 12.4 % (ref 11.6–15.4)
WBC: 8.5 10*3/uL (ref 3.4–10.8)

## 2022-04-11 NOTE — Telephone Encounter (Signed)
Cardiac Catheterization scheduled at [facility] for: Wednesday April 12, 2022 12:30 PM Arrival time and place: [place] Entrance A at: 9 AM-pre-procedure hydration  Nothing to eat after midnight prior to procedure, clear liquids until 5 AM day of procedure.  Medication instructions: -Hold:  Lisinopril-day before and day of procedure-per protocol GFR 53  Farxiga-AM of procedure -Except hold medications usual morning medications can be taken with sips of water including aspirin 81 mg.  Confirmed patient has responsible adult to drive home post procedure and be with patient first 24 hours after arriving home.  Patient reports no new symptoms concerning for COVID-19 in the past 10 days.  Reviewed procedure instructions with patient.

## 2022-04-12 ENCOUNTER — Encounter (HOSPITAL_COMMUNITY)
Admission: RE | Disposition: A | Discharge status: Home / Self Care | Payer: Self-pay | Source: Ambulatory Visit | Attending: Interventional Cardiology

## 2022-04-12 ENCOUNTER — Ambulatory Visit (HOSPITAL_COMMUNITY)
Admission: RE | Admit: 2022-04-12 | Discharge: 2022-04-12 | Disposition: A | Discharge status: Home / Self Care | Payer: BC Managed Care – PPO | Source: Ambulatory Visit | Attending: Interventional Cardiology | Admitting: Interventional Cardiology

## 2022-04-12 DIAGNOSIS — R9431 Abnormal electrocardiogram [ECG] [EKG]: Secondary | ICD-10-CM | POA: Diagnosis not present

## 2022-04-12 DIAGNOSIS — N189 Chronic kidney disease, unspecified: Secondary | ICD-10-CM | POA: Insufficient documentation

## 2022-04-12 DIAGNOSIS — Z79899 Other long term (current) drug therapy: Secondary | ICD-10-CM | POA: Insufficient documentation

## 2022-04-12 DIAGNOSIS — I129 Hypertensive chronic kidney disease with stage 1 through stage 4 chronic kidney disease, or unspecified chronic kidney disease: Secondary | ICD-10-CM | POA: Insufficient documentation

## 2022-04-12 DIAGNOSIS — I2510 Atherosclerotic heart disease of native coronary artery without angina pectoris: Secondary | ICD-10-CM | POA: Diagnosis not present

## 2022-04-12 DIAGNOSIS — D751 Secondary polycythemia: Secondary | ICD-10-CM | POA: Diagnosis present

## 2022-04-12 DIAGNOSIS — N183 Chronic kidney disease, stage 3 unspecified: Secondary | ICD-10-CM | POA: Diagnosis present

## 2022-04-12 DIAGNOSIS — Z87891 Personal history of nicotine dependence: Secondary | ICD-10-CM | POA: Diagnosis not present

## 2022-04-12 DIAGNOSIS — I739 Peripheral vascular disease, unspecified: Secondary | ICD-10-CM | POA: Diagnosis present

## 2022-04-12 DIAGNOSIS — J439 Emphysema, unspecified: Secondary | ICD-10-CM | POA: Insufficient documentation

## 2022-04-12 DIAGNOSIS — I2582 Chronic total occlusion of coronary artery: Secondary | ICD-10-CM | POA: Diagnosis not present

## 2022-04-12 DIAGNOSIS — R079 Chest pain, unspecified: Secondary | ICD-10-CM

## 2022-04-12 DIAGNOSIS — E119 Type 2 diabetes mellitus without complications: Secondary | ICD-10-CM

## 2022-04-12 DIAGNOSIS — I351 Nonrheumatic aortic (valve) insufficiency: Secondary | ICD-10-CM | POA: Diagnosis present

## 2022-04-12 DIAGNOSIS — E1122 Type 2 diabetes mellitus with diabetic chronic kidney disease: Secondary | ICD-10-CM | POA: Diagnosis not present

## 2022-04-12 HISTORY — PX: LEFT HEART CATH AND CORONARY ANGIOGRAPHY: CATH118249

## 2022-04-12 SURGERY — LEFT HEART CATH AND CORONARY ANGIOGRAPHY
Anesthesia: LOCAL

## 2022-04-12 MED ORDER — ACETAMINOPHEN 325 MG PO TABS: 650.0000 mg | ORAL_TABLET | ORAL | Status: DC | PRN

## 2022-04-12 MED ORDER — ONDANSETRON HCL 4 MG/2ML IJ SOLN: 4.0000 mg | Freq: Four times a day (QID) | INTRAMUSCULAR | Status: DC | PRN

## 2022-04-12 MED ORDER — SODIUM CHLORIDE 0.9% FLUSH: 3.0000 mL | INTRAVENOUS | Status: DC | PRN

## 2022-04-12 MED ORDER — LABETALOL HCL 5 MG/ML IV SOLN: 10.0000 mg | INTRAVENOUS | Status: DC | PRN

## 2022-04-12 MED ORDER — VERAPAMIL HCL 2.5 MG/ML IV SOLN
INTRAVENOUS | Status: AC
  Filled 2022-04-12: qty 2

## 2022-04-12 MED ORDER — SODIUM CHLORIDE 0.9% FLUSH: 3.0000 mL | Freq: Two times a day (BID) | INTRAVENOUS | Status: DC

## 2022-04-12 MED ORDER — HEPARIN (PORCINE) IN NACL 1000-0.9 UT/500ML-% IV SOLN
INTRAVENOUS | Status: DC | PRN
  Administered 2022-04-12 (×2): 500 mL

## 2022-04-12 MED ORDER — MIDAZOLAM HCL 2 MG/2ML IJ SOLN
INTRAMUSCULAR | Status: AC
  Filled 2022-04-12: qty 2

## 2022-04-12 MED ORDER — HEPARIN (PORCINE) IN NACL 1000-0.9 UT/500ML-% IV SOLN
INTRAVENOUS | Status: AC
  Filled 2022-04-12: qty 500

## 2022-04-12 MED ORDER — SODIUM CHLORIDE 0.9 % WEIGHT BASED INFUSION: 1.0000 mL/kg/h | INTRAVENOUS | Status: DC

## 2022-04-12 MED ORDER — SODIUM CHLORIDE 0.9 % IV SOLN: 250.0000 mL | INTRAVENOUS | Status: DC | PRN

## 2022-04-12 MED ORDER — LIDOCAINE HCL (PF) 1 % IJ SOLN
INTRAMUSCULAR | Status: AC
  Filled 2022-04-12: qty 30

## 2022-04-12 MED ORDER — FENTANYL CITRATE (PF) 100 MCG/2ML IJ SOLN
INTRAMUSCULAR | Status: AC
  Filled 2022-04-12: qty 2

## 2022-04-12 MED ORDER — HYDRALAZINE HCL 20 MG/ML IJ SOLN: 10.0000 mg | INTRAMUSCULAR | Status: DC | PRN

## 2022-04-12 MED ORDER — SODIUM CHLORIDE 0.9 % IV SOLN: INTRAVENOUS | Status: DC

## 2022-04-12 MED ORDER — FENTANYL CITRATE (PF) 100 MCG/2ML IJ SOLN
INTRAMUSCULAR | Status: DC | PRN
  Administered 2022-04-12: 50 ug via INTRAVENOUS

## 2022-04-12 MED ORDER — ASPIRIN 81 MG PO CHEW: 81.0000 mg | CHEWABLE_TABLET | Freq: Every day | ORAL | Status: DC

## 2022-04-12 MED ORDER — MIDAZOLAM HCL 2 MG/2ML IJ SOLN
INTRAMUSCULAR | Status: DC | PRN
  Administered 2022-04-12: 1 mg via INTRAVENOUS

## 2022-04-12 MED ORDER — HEPARIN SODIUM (PORCINE) 1000 UNIT/ML IJ SOLN
INTRAMUSCULAR | Status: DC | PRN
  Administered 2022-04-12: 4500 [IU] via INTRAVENOUS

## 2022-04-12 MED ORDER — HEPARIN SODIUM (PORCINE) 1000 UNIT/ML IJ SOLN
INTRAMUSCULAR | Status: AC
  Filled 2022-04-12: qty 10

## 2022-04-12 MED ORDER — SODIUM CHLORIDE 0.9 % WEIGHT BASED INFUSION
3.0000 mL/kg/h | INTRAVENOUS | Status: AC
  Administered 2022-04-12: 3 mL/kg/h via INTRAVENOUS

## 2022-04-12 MED ORDER — ASPIRIN 81 MG PO CHEW: 81.0000 mg | CHEWABLE_TABLET | ORAL | Status: DC

## 2022-04-12 MED ORDER — IOHEXOL 350 MG/ML SOLN
INTRAVENOUS | Status: DC | PRN
  Administered 2022-04-12: 75 mL

## 2022-04-12 MED ORDER — VERAPAMIL HCL 2.5 MG/ML IV SOLN
INTRAVENOUS | Status: DC | PRN
  Administered 2022-04-12: 10 mL via INTRA_ARTERIAL

## 2022-04-12 MED ORDER — LIDOCAINE HCL (PF) 1 % IJ SOLN
INTRAMUSCULAR | Status: DC | PRN
  Administered 2022-04-12: 2 mL

## 2022-04-12 SURGICAL SUPPLY — 10 items
CATH 5FR JL3.5 JR4 ANG PIG MP (CATHETERS) IMPLANT
DEVICE RAD COMP TR BAND LRG (VASCULAR PRODUCTS) IMPLANT
GLIDESHEATH SLEND A-KIT 6F 22G (SHEATH) IMPLANT
GUIDEWIRE INQWIRE 1.5J.035X260 (WIRE) IMPLANT
INQWIRE 1.5J .035X260CM (WIRE) ×1
KIT HEART LEFT (KITS) ×1 IMPLANT
PACK CARDIAC CATHETERIZATION (CUSTOM PROCEDURE TRAY) ×1 IMPLANT
SHEATH PROBE COVER 6X72 (BAG) IMPLANT
TRANSDUCER W/STOPCOCK (MISCELLANEOUS) ×1 IMPLANT
TUBING CIL FLEX 10 FLL-RA (TUBING) ×1 IMPLANT

## 2022-04-12 NOTE — CV Procedure (Signed)
100% mid RCA with right to right collaterals and also left to right collaterals. Smooth 30 to 50% mid LAD. Widely patent left main and circumflex. LV 11 mmHg.

## 2022-04-12 NOTE — Progress Notes (Signed)
TR BAND REMOVAL  LOCATION:    right radial  DEFLATED PER PROTOCOL:    Yes.    TIME BAND OFF / DRESSING APPLIED: 04/12/22 at [place] ARRIVAL:    Level 0  SITE AFTER BAND REMOVAL:    Level 0  CIRCULATION SENSATION AND MOVEMENT:    Within Normal Limits   Yes.    COMMENTS:

## 2022-04-12 NOTE — H&P (Signed)
Known RCA total occlusion Moderate LAD and circumflex by CTA modeling

## 2022-04-12 NOTE — Interval H&P Note (Signed)
History and Physical Interval Note:  04/12/2022 11:52 AM  Jacob Arnold  has presented today for surgery, with the diagnosis of CAD.  The various methods of treatment have been discussed with the patient and family. After consideration of risks, benefits and other options for treatment, the patient has consented to  Procedure(s): LEFT HEART CATH AND CORONARY ANGIOGRAPHY (N/A) as a surgical intervention.  The patient's history has been reviewed, patient examined, no change in status, stable for surgery.  I have reviewed the patient's chart and labs.  Questions were answered to the patient's satisfaction.     Belva Crome III

## 2022-04-12 NOTE — Interval H&P Note (Signed)
Cath Lab Visit (complete for each Cath Lab visit)  Clinical Evaluation Leading to the Procedure:   ACS: No.  Non-ACS:    Anginal Classification: CCS III  Anti-ischemic medical therapy: Minimal Therapy (1 class of medications)  Non-Invasive Test Results: High-risk stress test findings: cardiac mortality >3%/year  Prior CABG: No previous CABG      History and Physical Interval Note:  04/12/2022 11:55 AM  Jacob Arnold  has presented today for surgery, with the diagnosis of CAD.  The various methods of treatment have been discussed with the patient and family. After consideration of risks, benefits and other options for treatment, the patient has consented to  Procedure(s): LEFT HEART CATH AND CORONARY ANGIOGRAPHY (N/A) as a surgical intervention.  The patient's history has been reviewed, patient examined, no change in status, stable for surgery.  I have reviewed the patient's chart and labs.  Questions were answered to the patient's satisfaction.     Belva Crome III

## 2022-04-13 ENCOUNTER — Encounter (HOSPITAL_COMMUNITY): Payer: Self-pay | Admitting: Interventional Cardiology

## 2022-04-19 DIAGNOSIS — N1831 Chronic kidney disease, stage 3a: Secondary | ICD-10-CM | POA: Diagnosis not present

## 2022-04-21 ENCOUNTER — Other Ambulatory Visit: Payer: Self-pay | Admitting: *Deleted

## 2022-04-21 DIAGNOSIS — I70213 Atherosclerosis of native arteries of extremities with intermittent claudication, bilateral legs: Secondary | ICD-10-CM

## 2022-04-23 NOTE — Progress Notes (Unsigned)
Office Visit    Patient Name: Jacob Arnold Date of Encounter: 04/23/2022  PCP:  [name], [name], MD   [place]  Cardiologist:  Jenkins Rouge, MD  Advanced Practice Provider:  No care team member to display Electrophysiologist:  None   HPI    Jacob Arnold is a 61 y.o. male with a past medical history significant for smoking, hypertension, hyperlipidemia, diabetes mellitus type 2, CRF, and PAD presents today for follow-up appointment.  History significant for a 37-year history of smoking and quit about 2 years ago.  Lung cancer with CT 11/11/2021 with no cancer.  Aortic atherosclerosis and emphysema.  Ascending thoracic aorta 4.2 cm.  TTE 01/26/2022 reviewed with LVEF 55 to 60%, mild LVH, no LVE, mild to moderate AR, aorta 4.1 cm.  ABI 02/28/2021 with right 0.62 and left 0.76 with VVS follow-up arranged.  Recent labs show creatinine 1.85, K4.3, A1c 6, and LDL 79  No chest pain at his last appointment 04-04-2022.  Claudication in both calves when walking and Pletal has helped.  No ulcers or resting pain.  Works in [employer].  Cardiac CTA reviewed from 03/27/2022 with calcium score 502, 91st percentile for age/sex.  RCA total occlusion in mid vessel with distal vessel filling from collaterals.  Mid LAD 50 to 70% and LCx 25 to 49% with negative FFR in these vessels.  Also noted 60 to 79% R ICA stenosis on duplex 03/24/2022.  Occluded/no flow to left kidney on duplex same day.  Exertional dyspnea.  Discussed doing LHC last appointment to understand morphology of his RCA occlusion and see if it is this short segment occlusion that could be open in the future if need be.  Also, there was concern for his LAD lesion.  Collaterals most likely coming from the LAD and would not want to make sure this is not flow-limiting.  Claudication is mild and stable.  He sees Dr. Trula Slade.  Follow-up with renal and discussed functionally single kidney.  Holding ACE/ARB  prior to cath and hydration.  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 ([place])    DM (diabetes mellitus) ([place])    ED (erectile dysfunction)    Elevated TSH    Hyperglycemia    HYPERLIPIDEMIA    HYPERTENSION    Panic attacks    Systolic murmur    Tobacco abuse    Vitamin D deficiency    Past Surgical History:  Procedure Laterality Date   LEFT HEART CATH AND CORONARY ANGIOGRAPHY N/A 04/12/2022   Procedure: LEFT HEART CATH AND CORONARY ANGIOGRAPHY;  Surgeon: Belva Crome, MD;  Location: [facility];  Service: Cardiovascular;  Laterality: N/A;   TONSILLECTOMY     VASECTOMY      Allergies  Allergies  Allergen Reactions   Diphenhydramine Hcl Itching and Swelling    Benadryl*     EKGs/Labs/Other Studies Reviewed:   The following studies were reviewed today:  Cardiac catheterization 04/12/2022 Left Anterior Descending  Prox LAD to Mid LAD lesion is 50% stenosed.    Right Coronary Artery  Prox RCA to Mid RCA lesion is 100% stenosed.    Right Posterior Descending Artery  Collaterals  RPDA filled by collaterals from Dist LAD.      Right Posterior Atrioventricular Artery  Collaterals  RPAV filled by collaterals from Dist Cx.      First Right Posterolateral  Branch  Collaterals  1st RPL filled by collaterals from 2nd Sept.      Intervention   No interventions have been documented.   Left Heart  Left Ventricle LV end diastolic pressure is normal.   Coronary Diagrams  Diagnostic Dominance: Right  Intervention   Implants   No implant documentation for this case.    EKG:  EKG is *** ordered today.  The ekg ordered today demonstrates ***  Recent Labs: 09/01/2021: ALT 25; TSH 3.58 04/10/2022: BUN 19; Creatinine, Ser 1.50; Hemoglobin 16.2; Platelets 246; Potassium 4.9; Sodium 138  Recent Lipid Panel    Component Value Date/Time   CHOL 145 09/01/2021 0849    TRIG 143.0 09/01/2021 0849   HDL 37.30 (L) 09/01/2021 0849   CHOLHDL 4 09/01/2021 0849   VLDL 28.6 09/01/2021 0849   LDLCALC 79 09/01/2021 0849   LDLCALC  01/20/2020 0833     Comment:     . LDL cholesterol not calculated. Triglyceride levels greater than 400 mg/dL invalidate calculated LDL results. . Reference range: <100 . Desirable range <100 mg/dL for primary prevention;   <70 mg/dL for patients with CHD or diabetic patients  with > or = 2 CHD risk factors. Marland Kitchen LDL-C is now calculated using the Martin-Hopkins  calculation, which is a validated novel method providing  better accuracy than the Friedewald equation in the  estimation of LDL-C.  Cresenciano Genre et al. Annamaria Helling. 3009;233(00): 2061-2068  (http://education.QuestDiagnostics.com/faq/FAQ164)    LDLDIRECT 73.0 01/28/2021 1102    Risk Assessment/Calculations:  {Does this patient have ATRIAL FIBRILLATION?[phone]}  Home Medications   No outpatient medications have been marked as taking for the 04/26/22 encounter (Appointment) with Elgie Collard, PA-C.   Current Facility-Administered Medications for the 04/26/22 encounter (Appointment) with Elgie Collard, PA-C  Medication   sodium chloride flush (NS) 0.9 % injection 3 mL     Review of Systems   ***   All other systems reviewed and are otherwise negative except as noted above.  Physical Exam    VS:  There were no vitals taken for this visit. , BMI There is no height or weight on file to calculate BMI.  Wt Readings from Last 3 Encounters:  04/12/22 186 lb (84.4 kg)  04/04/22 186 lb 6.4 oz (84.6 kg)  03/09/22 189 lb (85.7 kg)     GEN: Well nourished, well developed, in no acute distress. HEENT: normal. Neck: Supple, no JVD, carotid bruits, or masses. Cardiac: ***RRR, no murmurs, rubs, or gallops. No clubbing, cyanosis, edema.  ***Radials/PT 2+ and equal bilaterally.  Respiratory:  ***Respirations regular and unlabored, clear to auscultation bilaterally. GI: Soft,  nontender, nondistended. MS: No deformity or atrophy. Skin: Warm and dry, no rash. Neuro:  Strength and sensation are intact. Psych: Normal affect.  Assessment & Plan    CAD status postcardiac cath with no PCI Murmur (mild to moderate AR and AV sclerosis) Hypertension Hyperlipidemia DM PVD Emphysema CRF   No BP recorded.  {Refresh Note OR Click here to enter BP  :1}***      Disposition: Follow up {follow up:15908} with Jenkins Rouge, MD or APP.  Signed, Elgie Collard, PA-C 04/23/2022, 6:41 PM [facility]

## 2022-04-26 ENCOUNTER — Encounter: Payer: Self-pay | Admitting: Physician Assistant

## 2022-04-26 ENCOUNTER — Ambulatory Visit: Payer: BC Managed Care – PPO | Attending: Physician Assistant | Admitting: Physician Assistant

## 2022-04-26 VITALS — BP 106/64 | HR 72 | Ht 70.0 in | Wt 188.0 lb

## 2022-04-26 DIAGNOSIS — N189 Chronic kidney disease, unspecified: Secondary | ICD-10-CM

## 2022-04-26 DIAGNOSIS — E785 Hyperlipidemia, unspecified: Secondary | ICD-10-CM | POA: Diagnosis not present

## 2022-04-26 DIAGNOSIS — I739 Peripheral vascular disease, unspecified: Secondary | ICD-10-CM

## 2022-04-26 DIAGNOSIS — J439 Emphysema, unspecified: Secondary | ICD-10-CM

## 2022-04-26 DIAGNOSIS — R0989 Other specified symptoms and signs involving the circulatory and respiratory systems: Secondary | ICD-10-CM

## 2022-04-26 DIAGNOSIS — I2510 Atherosclerotic heart disease of native coronary artery without angina pectoris: Secondary | ICD-10-CM

## 2022-04-26 DIAGNOSIS — I10 Essential (primary) hypertension: Secondary | ICD-10-CM | POA: Diagnosis not present

## 2022-04-26 DIAGNOSIS — I701 Atherosclerosis of renal artery: Secondary | ICD-10-CM

## 2022-04-26 DIAGNOSIS — R0609 Other forms of dyspnea: Secondary | ICD-10-CM

## 2022-04-26 NOTE — Patient Instructions (Signed)
Medication Instructions:  Your physician recommends that you continue on your current medications as directed. Please refer to the Current Medication list given to you today.  *If you need a refill on your cardiac medications before your next appointment, please call your pharmacy*   Lab Work: Have nephrology add a lipid panel when your have labs drawn on 05/01/22 If you have labs (blood work) drawn today and your tests are completely normal, you will receive your results only by: [place] (if you have MyChart) OR A paper copy in the mail If you have any lab test that is abnormal or we need to change your treatment, we will call you to review the results.   Follow-Up: At [facility], you and your health needs are our priority.  As part of our continuing mission to provide you with exceptional heart care, we have created designated Provider Care Teams.  These Care Teams include your primary Cardiologist (physician) and Advanced Practice Providers (APPs -  Physician Assistants and Nurse Practitioners) who all work together to provide you with the care you need, when you need it.  Your next appointment:   3 month(s)  The format for your next appointment:   In Person  Provider:   Jenkins Rouge, MD    Important Information About Sugar

## 2022-04-28 ENCOUNTER — Encounter: Payer: Self-pay | Admitting: Cardiovascular Disease

## 2022-04-28 MED ORDER — NITROGLYCERIN 0.4 MG SL SUBL: SUBLINGUAL_TABLET | SUBLINGUAL | 6 refills | Status: DC

## 2022-04-28 NOTE — Telephone Encounter (Signed)
Per OV note by Johann Capers on 04/26/22:  CAD status post cardiac cath with no PCI -continue current medications: Norvasc '10mg'$  daily, asa '81mg'$  daily, pletal '100mg'$  BID, farxiga '10mg'$  daily, zetia '10mg'$  daily, hydralazine '75mg'$  TID, labetalol '200mg'$  BID, lisinopril '10mg'$  daily, nitro as needed -offered cardiac rehab and the patient will let us know -working on smoking cessation with nicotine pouches

## 2022-05-05 DIAGNOSIS — N1831 Chronic kidney disease, stage 3a: Secondary | ICD-10-CM | POA: Diagnosis not present

## 2022-05-05 DIAGNOSIS — E785 Hyperlipidemia, unspecified: Secondary | ICD-10-CM | POA: Diagnosis not present

## 2022-05-06 LAB — LAB REPORT - SCANNED
Creatinine, POC: 89.1 mg/dL
EGFR: 57
Protein/Creatinine Ratio: 259

## 2022-05-08 ENCOUNTER — Ambulatory Visit: Payer: BC Managed Care – PPO | Admitting: Surgery

## 2022-05-08 ENCOUNTER — Encounter: Payer: Self-pay | Admitting: Internal Medicine

## 2022-05-08 ENCOUNTER — Encounter (HOSPITAL_COMMUNITY): Payer: Self-pay

## 2022-05-08 ENCOUNTER — Ambulatory Visit (HOSPITAL_COMMUNITY): Admission: RE | Admit: 2022-05-08 | Payer: BC Managed Care – PPO | Source: Ambulatory Visit

## 2022-05-14 ENCOUNTER — Other Ambulatory Visit: Payer: Self-pay | Admitting: Internal Medicine

## 2022-05-14 DIAGNOSIS — I10 Essential (primary) hypertension: Secondary | ICD-10-CM

## 2022-05-23 ENCOUNTER — Other Ambulatory Visit: Payer: Self-pay | Admitting: Internal Medicine

## 2022-05-23 DIAGNOSIS — I10 Essential (primary) hypertension: Secondary | ICD-10-CM

## 2022-05-23 DIAGNOSIS — E785 Hyperlipidemia, unspecified: Secondary | ICD-10-CM

## 2022-06-19 ENCOUNTER — Other Ambulatory Visit: Payer: Self-pay | Admitting: Internal Medicine

## 2022-06-19 DIAGNOSIS — I10 Essential (primary) hypertension: Secondary | ICD-10-CM

## 2022-07-03 ENCOUNTER — Encounter: Payer: Self-pay | Admitting: Internal Medicine

## 2022-07-18 NOTE — Progress Notes (Signed)
Office Visit    Patient Name: Jacob Arnold Date of Encounter: 07/27/2022  PCP:  [name], [name], [place]  Cardiologist:  Jenkins Rouge, MD    HPI    Jacob Arnold is a 62 y.o. male with a past medical history significant for smoking, hypertension, hyperlipidemia, diabetes mellitus type 2, CRF, and PAD presents today for follow-up appointment.  History significant for a 37-year history of smoking and quit about 3 years ago.  Lung cancer  CT 11/11/2021 with no cancer.  Aortic atherosclerosis and emphysema.  Ascending thoracic aorta 4.2 cm.  TTE 01/26/2022 reviewed with LVEF 55 to 60%, mild LVH, no LVE, mild to moderate AR, aorta 4.1 cm.  ABI 02/28/2021 with right 0.62 and left 0.76 with [facility] follow-up arranged. Pletal has helped Sees [facility]  He has 60-79% right ICA stenosis by duplex 03/24/22    Cardiac CTA reviewed from 03/27/2022 with calcium score 502, 91st percentile for age/sex.  RCA total occlusion in mid vessel with distal vessel filling from collaterals.  Mid LAD 50 to 70% and LCx 25 to 49% with negative FFR in these vessels.  Also noted 60 to 79% R ICA stenosis on duplex 03/24/2022.  Occluded/no flow to left kidney on duplex same day.  Cath:  04/12/22 with CTO RCA left to right and right to right collaterals 40-50% LAD To my review not ideal for CTO intervention in future with long segment occlusion and multiple branches   No angina, Claudication stable  Able to mow lawn completely now without stopping No TIA symptoms On son home d/c from [employer] with Bechet's and one son home with intractable seizures     Past Medical History    Past Medical History:  Diagnosis Date   Atherosclerosis of native artery of lower extremity    DM (diabetes mellitus)    ED (erectile dysfunction)    Elevated TSH    Hyperglycemia    HYPERLIPIDEMIA    HYPERTENSION    Panic attacks    Systolic murmur    Tobacco abuse    Vitamin D deficiency     Past Surgical History:  Procedure Laterality Date   LEFT HEART CATH AND CORONARY ANGIOGRAPHY N/A 04/12/2022   Procedure: LEFT HEART CATH AND CORONARY ANGIOGRAPHY;  Surgeon: Belva Crome, MD;  Location: [facility];  Service: Cardiovascular;  Laterality: N/A;   TONSILLECTOMY     VASECTOMY      Allergies  Allergies  Allergen Reactions   Diphenhydramine Hcl Itching and Swelling    Benadryl*     EKGs/Labs/Other Studies Reviewed:   The following studies were reviewed today:  Cardiac catheterization 04/12/2022 Left Anterior Descending  Prox LAD to Mid LAD lesion is 50% stenosed.    Right Coronary Artery  Prox RCA to Mid RCA lesion is 100% stenosed.    Right Posterior Descending Artery  Collaterals  RPDA filled by collaterals from Dist LAD.      Right Posterior Atrioventricular Artery  Collaterals  RPAV filled by collaterals from Dist Cx.      First Right Posterolateral Branch  Collaterals  1st RPL filled by collaterals from 2nd Sept.      Intervention   No interventions have been documented.   Left Heart  Left Ventricle LV end diastolic pressure is normal.   Coronary Diagrams  Diagnostic Dominance: Right  Intervention   Implants   No implant documentation for this case.    EKG:  EKG is not ordered today.   Recent Labs: 09/01/2021: ALT 25; TSH 3.58 04/10/2022: BUN 19; Creatinine, Ser 1.50; Hemoglobin 16.2; Platelets 246; Potassium 4.9; Sodium 138  Recent Lipid Panel    Component Value Date/Time   CHOL 145 09/01/2021 0849   TRIG 143.0 09/01/2021 0849   HDL 37.30 (L) 09/01/2021 0849   CHOLHDL 4 09/01/2021 0849   VLDL 28.6 09/01/2021 0849   LDLCALC 79 09/01/2021 0849   LDLCALC  01/20/2020 0833     Comment:     . LDL cholesterol not calculated. Triglyceride levels greater than 400 mg/dL invalidate calculated LDL results. . Reference range: <100 . Desirable range <100 mg/dL for primary prevention;   <70 mg/dL for patients with CHD  or diabetic patients  with > or = 2 CHD risk factors. Marland Kitchen LDL-C is now calculated using the Martin-Hopkins  calculation, which is a validated novel method providing  better accuracy than the Friedewald equation in the  estimation of LDL-C.  Cresenciano Genre et al. Annamaria Helling. WG:[id number]): 2061-2068  (http://education.[employer].com/faq/FAQ164)    LDLDIRECT 73.0 01/28/2021 1102    Home Medications   Current Meds  Medication Sig   amLODipine (NORVASC) 10 MG tablet TAKE 1 TABLET BY MOUTH EVERY DAY   aspirin 81 MG tablet Take 1 tablet (81 mg total) by mouth daily.   atorvastatin (LIPITOR) 80 MG tablet TAKE 1 TABLET BY MOUTH EVERY DAY   cilostazol (PLETAL) 100 MG tablet TAKE 1 TABLET BY MOUTH TWICE A DAY BEFORE MEALS   ezetimibe (ZETIA) 10 MG tablet TAKE 1 TABLET BY MOUTH EVERY DAY   FARXIGA 10 MG TABS tablet Take 10 mg by mouth daily.   hydrALAZINE (APRESOLINE) 50 MG tablet TAKE 1.5 TABLETS (75 MG TOTAL) BY MOUTH 3 (THREE) TIMES DAILY.   labetalol (NORMODYNE) 200 MG tablet TAKE 2 TABLETS BY MOUTH TWICE A DAY   lisinopril (ZESTRIL) 10 MG tablet TAKE 1 TABLET BY MOUTH EVERY DAY   nitroGLYCERIN (NITROSTAT) 0.4 MG SL tablet Dissolve 1 tablet under the tongue every 5 minutes as needed for chest pain. Max of 3 doses, then 911.   Omega-3 Fatty Acids (FISH OIL) 1000 MG CAPS Take 2,000 mg by mouth daily.   sodium bicarbonate 650 MG tablet Take 650 mg by mouth once.   Current Facility-Administered Medications for the 07/27/22 encounter (Office Visit) with Josue Hector, MD  Medication   sodium chloride flush (NS) 0.9 % injection 3 mL     Review of Systems      All other systems reviewed and are otherwise negative except as noted above.  Physical Exam    VS:  BP (!) 102/50   Pulse 67   Ht 5\' 10"  (1.778 m)   Wt 174 lb (78.9 kg)   SpO2 95%   BMI 24.97 kg/m  , BMI Body mass index is 24.97 kg/m.  Wt Readings from Last 3 Encounters:  07/27/22 174 lb (78.9 kg)  04/26/22 188 lb (85.3 kg)   04/12/22 186 lb (84.4 kg)    Affect appropriate Healthy:  appears stated age 33: normal Neck supple with no adenopathy JVP normal  right bruits no thyromegaly Lungs clear with no wheezing and good diaphragmatic motion Heart:  S1/S2 no murmur, no rub, gallop or click PMI normal Abdomen: benighn, BS positve, no tenderness, no AAA no bruit.  No HSM or HJR Decreased peripheral pulses normal cap refill  No edema Neuro non-focal Skin warm and dry No muscular weakness   Assessment & Plan  CAD status post cardiac cath with no PCI -continue current medications: Norvasc 10mg  daily, asa 81mg  daily, pletal 100mg  BID, farxiga 10mg  daily, zetia 10mg  daily, hydralazine 75mg  TID, labetalol 200mg  BID, lisinopril 10mg  daily, nitro as needed -chronic CTO of RCA with good collaterals by cath 04/12/22 normal EF   Murmur (mild to moderate AR and AV sclerosis) -update echo October 2024  -stable without symptoms  Hypertension -low normal today -continue current medications  Hyperlipidemia -continue zetia and lipitor -repeat lipid panel 1/8 when he has labs done for nephrology  DM -per PCP  PVD -vascular follow-up arranged -continue pletal - carotid duplex 03/2023   CRF -renal follow-up with labs arranged for next week -  Cr 1.42   - Functionally one kidney f/u nephrology    TTE October 2024 AR Carotid Duplex  December 2024    Disposition: Follow up 6 months  Signed, Jenkins Rouge, MD 07/27/2022, 8:08 AM [place]

## 2022-07-27 ENCOUNTER — Ambulatory Visit: Payer: BC Managed Care – PPO | Attending: Cardiovascular Disease | Admitting: Cardiovascular Disease

## 2022-07-27 ENCOUNTER — Encounter: Payer: Self-pay | Admitting: Cardiovascular Disease

## 2022-07-27 VITALS — BP 102/50 | HR 67 | Ht 70.0 in | Wt 174.0 lb

## 2022-07-27 DIAGNOSIS — I739 Peripheral vascular disease, unspecified: Secondary | ICD-10-CM

## 2022-07-27 DIAGNOSIS — I6521 Occlusion and stenosis of right carotid artery: Secondary | ICD-10-CM

## 2022-07-27 DIAGNOSIS — I701 Atherosclerosis of renal artery: Secondary | ICD-10-CM | POA: Diagnosis not present

## 2022-07-27 DIAGNOSIS — I2510 Atherosclerotic heart disease of native coronary artery without angina pectoris: Secondary | ICD-10-CM | POA: Diagnosis not present

## 2022-07-27 NOTE — Patient Instructions (Signed)
Medication Instructions:  Your physician recommends that you continue on your current medications as directed. Please refer to the Current Medication list given to you today.  *If you need a refill on your cardiac medications before your next appointment, please call your pharmacy*  Lab Work: If you have labs (blood work) drawn today and your tests are completely normal, you will receive your results only by: [place] (if you have MyChart) OR A paper copy in the mail If you have any lab test that is abnormal or we need to change your treatment, we will call you to review the results.   Testing/Procedures: Your physician has requested that you have an echocardiogram in October. Echocardiography is a painless test that uses sound waves to create images of your heart. It provides your doctor with information about the size and shape of your heart and how well your heart's chambers and valves are working. This procedure takes approximately one hour. There are no restrictions for this procedure. Please do NOT wear cologne, perfume, aftershave, or lotions (deodorant is allowed). Please arrive 15 minutes prior to your appointment time.  Your physician has requested that you have a carotid duplex in December. This test is an ultrasound of the carotid arteries in your neck. It looks at blood flow through these arteries that supply the brain with blood. Allow one hour for this exam. There are no restrictions or special instructions.  Follow-Up: At [facility], you and your health needs are our priority.  As part of our continuing mission to provide you with exceptional heart care, we have created designated Provider Care Teams.  These Care Teams include your primary Cardiologist (physician) and Advanced Practice Providers (APPs -  Physician Assistants and Nurse Practitioners) who all work together to provide you with the care you need, when you need it.  We recommend signing up for the  patient portal called "MyChart".  Sign up information is provided on this After Visit Summary.  MyChart is used to connect with patients for Virtual Visits (Telemedicine).  Patients are able to view lab/test results, encounter notes, upcoming appointments, etc.  Non-urgent messages can be sent to your provider as well.   To learn more about what you can do with MyChart, go to [email or web address].    Your next appointment:   6 month(s)  Provider:   Jenkins Rouge, MD

## 2022-08-25 ENCOUNTER — Encounter: Payer: Self-pay | Admitting: *Deleted

## 2022-09-11 DIAGNOSIS — E1122 Type 2 diabetes mellitus with diabetic chronic kidney disease: Secondary | ICD-10-CM | POA: Diagnosis not present

## 2022-09-11 DIAGNOSIS — E559 Vitamin D deficiency, unspecified: Secondary | ICD-10-CM | POA: Diagnosis not present

## 2022-09-11 DIAGNOSIS — R809 Proteinuria, unspecified: Secondary | ICD-10-CM | POA: Diagnosis not present

## 2022-09-11 DIAGNOSIS — I129 Hypertensive chronic kidney disease with stage 1 through stage 4 chronic kidney disease, or unspecified chronic kidney disease: Secondary | ICD-10-CM | POA: Diagnosis not present

## 2022-09-11 DIAGNOSIS — N1831 Chronic kidney disease, stage 3a: Secondary | ICD-10-CM | POA: Diagnosis not present

## 2022-09-15 ENCOUNTER — Other Ambulatory Visit: Payer: Self-pay | Admitting: Internal Medicine

## 2022-09-15 DIAGNOSIS — I10 Essential (primary) hypertension: Secondary | ICD-10-CM

## 2022-09-19 ENCOUNTER — Other Ambulatory Visit: Payer: Self-pay

## 2022-09-19 DIAGNOSIS — I70213 Atherosclerosis of native arteries of extremities with intermittent claudication, bilateral legs: Secondary | ICD-10-CM

## 2022-10-02 ENCOUNTER — Encounter: Payer: Self-pay | Admitting: Surgery

## 2022-10-02 ENCOUNTER — Ambulatory Visit (HOSPITAL_COMMUNITY)
Admission: RE | Admit: 2022-10-02 | Discharge: 2022-10-02 | Disposition: A | Discharge status: Home / Self Care | Payer: BC Managed Care – PPO | Source: Ambulatory Visit | Attending: Surgery | Admitting: Surgery

## 2022-10-02 ENCOUNTER — Ambulatory Visit: Payer: BC Managed Care – PPO | Admitting: Surgery

## 2022-10-02 VITALS — BP 116/63 | HR 59 | Temp 98.3°F | Resp 20 | Ht 70.0 in | Wt 170.0 lb

## 2022-10-02 DIAGNOSIS — I6521 Occlusion and stenosis of right carotid artery: Secondary | ICD-10-CM

## 2022-10-02 DIAGNOSIS — I70213 Atherosclerosis of native arteries of extremities with intermittent claudication, bilateral legs: Secondary | ICD-10-CM | POA: Diagnosis not present

## 2022-10-02 LAB — VAS US ABI WITH/WO TBI
Left ABI: 0.89
Right ABI: 0.88

## 2022-10-02 NOTE — Progress Notes (Signed)
[facility] [place]  Patient name: Jacob Arnold MRN: [id number] DOB: 1960/06/14 Sex: male   REASON FOR VISIT:    Follow up  HISOTRY OF PRESENT ILLNESS:    Jacob Arnold is a 62 y.o. male who I saw in 2022 for claudication.  He is complaining of bilateral calf cramping at 100 yards.  He was started on cilostazol and treated with medical therapy.  He is now able to walk quarter mile before having symptoms.  He had a carotid ultrasound in December 2023 that showed 60 to 79% right carotid stenosis.  End-diastolic velocity was roughly 409 cm/s.  He is asymptomatic.  Specifically, he denies numbness or weakness in either extremity.  He denies slurred speech.  He denies amaurosis fugax  The patient is a former smoker having quit 1 year ago.  He is on high-dose statin therapy for hypercholesterolemia.  His blood pressure is managed with an ACE inhibitor.  He has been diagnosed as a borderline diabetic.  He was previously hospitalized for several days for pericarditis many years ago.  PAST MEDICAL HISTORY:   Past Medical History:  Diagnosis Date   Atherosclerosis of native artery of lower extremity (HCC)    Diverticulosis    DM (diabetes mellitus) (HCC)    ED (erectile dysfunction)    Elevated TSH    Hyperglycemia    HYPERLIPIDEMIA    HYPERTENSION    Panic attacks    Systolic murmur    Tobacco abuse    Vitamin D deficiency      FAMILY HISTORY:   Family History  Problem Relation Age of Onset   Breast cancer Mother    Liver cancer Mother    Diabetes Father    Hyperlipidemia Neg Hx        family hx   Heart disease Neg Hx        family hx   Hypertension Neg Hx        family hx   Cancer Neg Hx        family hx    SOCIAL HISTORY:   Social History   Tobacco Use   Smoking status: Former    Packs/day: 1.00    Years: 37.00    Additional pack years: 0.00    Total pack years: 37.00    Types: Cigarettes     Quit date: 2022    Years since quitting: 2.4   Smokeless tobacco: Never   Tobacco comments:    Quit 2022  Substance Use Topics   Alcohol use: Yes    Alcohol/week: 6.0 standard drinks of alcohol    Types: 6 Cans of beer per week     ALLERGIES:   Allergies  Allergen Reactions   Diphenhydramine Hcl Itching and Swelling    Benadryl*      CURRENT MEDICATIONS:   Current Outpatient Medications  Medication Sig Dispense Refill   amLODipine (NORVASC) 10 MG tablet TAKE 1 TABLET BY MOUTH EVERY DAY 90 tablet 1   aspirin 81 MG tablet Take 1 tablet (81 mg total) by mouth daily. 30 tablet    atorvastatin (LIPITOR) 80 MG tablet TAKE 1 TABLET BY MOUTH EVERY DAY 90 tablet 1   cilostazol (PLETAL) 100 MG tablet TAKE 1 TABLET BY MOUTH TWICE A DAY BEFORE MEALS 180 tablet 2   ezetimibe (ZETIA) 10 MG tablet TAKE 1 TABLET BY MOUTH EVERY DAY 90 tablet 1   FARXIGA 10 MG TABS tablet Take 10 mg by mouth daily.  hydrALAZINE (APRESOLINE) 50 MG tablet TAKE 1.5 TABLETS (75 MG TOTAL) BY MOUTH 3 (THREE) TIMES DAILY. 405 tablet 1   labetalol (NORMODYNE) 200 MG tablet TAKE 2 TABLETS BY MOUTH TWICE A DAY 360 tablet 0   lisinopril (ZESTRIL) 10 MG tablet TAKE 1 TABLET BY MOUTH EVERY DAY 90 tablet 1   nitroGLYCERIN (NITROSTAT) 0.4 MG SL tablet Dissolve 1 tablet under the tongue every 5 minutes as needed for chest pain. Max of 3 doses, then 911. 25 tablet 6   Omega-3 Fatty Acids (FISH OIL) 1000 MG CAPS Take 2,000 mg by mouth daily.     sodium bicarbonate 650 MG tablet Take 650 mg by mouth once.     Current Facility-Administered Medications  Medication Dose Route Frequency Provider Last Rate Last Admin   sodium chloride flush (NS) 0.9 % injection 3 mL  3 mL Intravenous Q12H Wendall Stade, MD        REVIEW OF SYSTEMS:   [X]  denotes positive finding, [ ]  denotes negative finding Cardiac  Comments:  Chest pain or chest pressure:    Shortness of breath upon exertion:    Short of breath when lying flat:     Irregular heart rhythm:        Vascular    Pain in calf, thigh, or hip brought on by ambulation:    Pain in feet at night that wakes you up from your sleep:     Blood clot in your veins:    Leg swelling:         Pulmonary    Oxygen at home:    Productive cough:     Wheezing:         Neurologic    Sudden weakness in arms or legs:     Sudden numbness in arms or legs:     Sudden onset of difficulty speaking or slurred speech:    Temporary loss of vision in one eye:     Problems with dizziness:         Gastrointestinal    Blood in stool:     Vomited blood:         Genitourinary    Burning when urinating:     Blood in urine:        Psychiatric    Major depression:         Hematologic    Bleeding problems:    Problems with blood clotting too easily:        Skin    Rashes or ulcers:        Constitutional    Fever or chills:      PHYSICAL EXAM:   Vitals:   10/02/22 1335  BP: 116/63  Pulse: (!) 59  Resp: 20  Temp: 98.3 F (36.8 C)  SpO2: 94%  Weight: 170 lb (77.1 kg)  Height: 5\' 10"  (1.778 m)    GENERAL: The patient is a well-nourished male, in no acute distress. The vital signs are documented above. CARDIAC: There is a regular rate and rhythm.  VASCULAR: Palpable dorsalis pedis pulses bilaterally.  No carotid bruits. PULMONARY: Non-labored respirations ABDOMEN: Soft and non-tender.  No pulsatile mass MUSCULOSKELETAL: There are no major deformities or cyanosis. NEUROLOGIC: No focal weakness or paresthesias are detected. SKIN: There are no ulcers or rashes noted. PSYCHIATRIC: The patient has a normal affect.  STUDIES:   I have reviewed the following: ABI/TBIToday's ABIToday's TBIPrevious ABIPrevious TBI  +-------+-----------+-----------+------------+------------+  Right 0.88       0.80  0.62        0.48          +-------+-----------+-----------+------------+------------+  Left  0.89       0.75       0.76        0.64           +-------+-----------+-----------+------------+------------+    MEDICAL ISSUES:   Claudication: The patient's symptoms are tolerable at this point.  He will continue his cilostazol.  He knows to contact me should he develop a nonhealing wound or rest pain  Carotid: He has an asymptomatic moderate right-sided stenosis.  He is scheduled to get a follow-up ultrasound in December.  I will see him after the ultrasound.    Charlena Cross, MD, FACS [facility] [phone] Pager [phone]

## 2022-10-05 ENCOUNTER — Encounter: Payer: Self-pay | Admitting: Internal Medicine

## 2022-10-10 ENCOUNTER — Ambulatory Visit: Payer: BC Managed Care – PPO | Admitting: Internal Medicine

## 2022-10-10 ENCOUNTER — Encounter: Payer: Self-pay | Admitting: Internal Medicine

## 2022-10-10 VITALS — BP 130/64 | HR 77 | Ht 70.0 in | Wt 172.0 lb

## 2022-10-10 DIAGNOSIS — Z1211 Encounter for screening for malignant neoplasm of colon: Secondary | ICD-10-CM

## 2022-10-10 MED ORDER — NA SULFATE-K SULFATE-MG SULF 17.5-3.13-1.6 GM/177ML PO SOLN: 1.0000 | Freq: Once | ORAL | 0 refills | Status: AC

## 2022-10-10 NOTE — Progress Notes (Signed)
Patient ID: Jacob Arnold, male   DOB: 1961/02/28, 62 y.o.   MRN: [id number] HPI: Jacob Arnold is a 62 year old male with a history of hypertension, hyperlipidemia, peripheral artery disease, diabetes, CKD who presents to discuss colon cancer screening.  He is here alone today.  He also takes daily aspirin and Pletal.  He had a colonoscopy with me on 04/12/2012.  This revealed sigmoid diverticulosis but was otherwise normal.  He reports no specific GI complaint.  He has regular bowel movements.  No blood in stool or melena.  No abdominal pain.  No change in bowel habit.  No heartburn, dysphagia or odynophagia.  Good appetite.  No early satiety, nausea or vomiting.  He does have intermittent stable angina for which he uses sublingual nitroglycerin.  He notes that this has been less frequent of late.  He follows with cardiology, vascular surgery and nephrology.  There is no family history of GI tract malignancy.  Past Medical History:  Diagnosis Date   Atherosclerosis of native artery of lower extremity (HCC)    Diverticulosis    DM (diabetes mellitus) (HCC)    ED (erectile dysfunction)    Elevated TSH    Hyperglycemia    HYPERLIPIDEMIA    HYPERTENSION    Panic attacks    Systolic murmur    Tobacco abuse    Vitamin D deficiency     Past Surgical History:  Procedure Laterality Date   LEFT HEART CATH AND CORONARY ANGIOGRAPHY N/A 04/12/2022   Procedure: LEFT HEART CATH AND CORONARY ANGIOGRAPHY;  Surgeon: Lyn Records, MD;  Location: [facility];  Service: Cardiovascular;  Laterality: N/A;   TONSILLECTOMY     VASECTOMY      Outpatient Medications Prior to Visit  Medication Sig Dispense Refill   amLODipine (NORVASC) 10 MG tablet TAKE 1 TABLET BY MOUTH EVERY DAY 90 tablet 1   aspirin 81 MG tablet Take 1 tablet (81 mg total) by mouth daily. 30 tablet    atorvastatin (LIPITOR) 80 MG tablet TAKE 1 TABLET BY MOUTH EVERY DAY 90 tablet 1   cilostazol (PLETAL) 100 MG tablet  TAKE 1 TABLET BY MOUTH TWICE A DAY BEFORE MEALS 180 tablet 2   ezetimibe (ZETIA) 10 MG tablet TAKE 1 TABLET BY MOUTH EVERY DAY 90 tablet 1   FARXIGA 10 MG TABS tablet Take 10 mg by mouth daily.     hydrALAZINE (APRESOLINE) 50 MG tablet TAKE 1.5 TABLETS (75 MG TOTAL) BY MOUTH 3 (THREE) TIMES DAILY. 405 tablet 1   labetalol (NORMODYNE) 200 MG tablet TAKE 2 TABLETS BY MOUTH TWICE A DAY 360 tablet 0   lisinopril (ZESTRIL) 10 MG tablet TAKE 1 TABLET BY MOUTH EVERY DAY 90 tablet 1   nitroGLYCERIN (NITROSTAT) 0.4 MG SL tablet Dissolve 1 tablet under the tongue every 5 minutes as needed for chest pain. Max of 3 doses, then 911. 25 tablet 6   Omega-3 Fatty Acids (FISH OIL) 1000 MG CAPS Take 2,000 mg by mouth daily.     sodium bicarbonate 650 MG tablet Take 650 mg by mouth 2 (two) times daily.     Facility-Administered Medications Prior to Visit  Medication Dose Route Frequency Provider Last Rate Last Admin   sodium chloride flush (NS) 0.9 % injection 3 mL  3 mL Intravenous Q12H Wendall Stade, MD        Allergies  Allergen Reactions   Diphenhydramine Hcl Itching and Swelling    Benadryl*     Family History  Problem Relation Age of Onset   Breast cancer Mother    Liver cancer Mother    Diabetes Father    Lung cancer Father    Hyperlipidemia Neg Hx        family hx   Heart disease Neg Hx        family hx   Hypertension Neg Hx        family hx   Cancer Neg Hx        family hx   Colon cancer Neg Hx    Stomach cancer Neg Hx     Social History   Tobacco Use   Smoking status: Former    Packs/day: 1.00    Years: 37.00    Additional pack years: 0.00    Total pack years: 37.00    Types: Cigarettes    Quit date: 2022    Years since quitting: 2.4   Smokeless tobacco: Never   Tobacco comments:    Quit 2022  Vaping Use   Vaping Use: Never used  Substance Use Topics   Alcohol use: Yes    Alcohol/week: 6.0 standard drinks of alcohol    Types: 6 Cans of beer per week   Drug use: No     ROS: As per history of present illness, otherwise negative  BP 130/64   Pulse 77   Ht 5\' 10"  (1.778 m)   Wt 172 lb (78 kg)   BMI 24.68 kg/m  Gen: awake, alert, NAD HEENT: anicteric  CV: RRR, no mrg Pulm: CTA b/l Abd: soft, NT/ND, +BS throughout Ext: no c/c/e Neuro: nonfocal   RELEVANT LABS AND IMAGING: CBC    Component Value Date/Time   WBC 8.5 04/10/2022 1324   WBC 9.2 09/01/2021 0849   RBC 5.11 04/10/2022 1324   RBC 5.05 03/22/2022 0000   HGB 16.2 04/10/2022 1324   HCT 46.8 04/10/2022 1324   PLT 246 04/10/2022 1324   MCV 92 04/10/2022 1324   MCH 31.7 04/10/2022 1324   MCH 32.2 03/12/2020 1138   MCHC 34.6 04/10/2022 1324   MCHC 34.0 09/01/2021 0849   RDW 12.4 04/10/2022 1324   LYMPHSABS 2.8 04/10/2022 1324   MONOABS 0.7 09/01/2021 0849   EOSABS 0.6 (H) 04/10/2022 1324   BASOSABS 0.1 04/10/2022 1324    CMP     Component Value Date/Time   NA 138 04/10/2022 1324   K 4.9 04/10/2022 1324   CL 106 04/10/2022 1324   CO2 17 (L) 04/10/2022 1324   GLUCOSE 133 (H) 04/10/2022 1324   GLUCOSE 134 (H) 09/01/2021 0849   BUN 19 04/10/2022 1324   CREATININE 1.50 (H) 04/10/2022 1324   CREATININE 1.18 01/20/2020 0833   CALCIUM 9.6 04/10/2022 1324   PROT 6.7 09/01/2021 0849   ALBUMIN 4.4 09/01/2021 0849   AST 16 09/01/2021 0849   ALT 25 09/01/2021 0849   ALKPHOS 98 09/01/2021 0849   BILITOT 0.6 09/01/2021 0849   GFRNONAA 57 (L) 10/04/2011 0817   GFRAA 66 (L) 10/04/2011 0817    ASSESSMENT/PLAN: 62 year old male with a history of hypertension, hyperlipidemia, peripheral artery disease, diabetes, CKD who presents to discuss colon cancer screening.   Colorectal cancer screening --colonoscopy is recommended.  We discussed current guidelines for antiplatelet therapy and there is no need a recommendation to stop aspirin or Pletal.  We reviewed the risk, benefits and alternatives to colonoscopy and he is agreeable and wishes to proceed.  His angina is stable and his  echocardiogram is stable as well.  We will proceed with [facility] colonoscopy. -- Colonoscopy in the [facility]  2.  Peripheral vascular disease --continue aspirin and Pletal      UJ:[id number] Priscella Mann, Md [address] [place],  [place] [address]

## 2022-10-10 NOTE — Patient Instructions (Addendum)
You have been scheduled for a colonoscopy. Please follow written instructions given to you at your visit today.  Please pick up your prep supplies at the pharmacy within the next 1-3 days. If you use inhalers (even only as needed), please bring them with you on the day of your procedure.  Remain on Pletal and aspirin for your colonoscopy.  If you are using your Nitroglycerin more frequently leading up to your colonoscopy, please contact our office.   _______________________________________________________  If your blood pressure at your visit was 140/90 or greater, please contact your primary care physician to follow up on this.  _______________________________________________________  If you are age 61 or older, your body mass index should be between 23-30. Your Body mass index is 24.68 kg/m. If this is out of the aforementioned range listed, please consider follow up with your Primary Care Provider.  If you are age 59 or younger, your body mass index should be between 19-25. Your Body mass index is 24.68 kg/m. If this is out of the aformentioned range listed, please consider follow up with your Primary Care Provider.   ________________________________________________________  The [facility] providers would like to encourage you to use [facility] to communicate with providers for non-urgent requests or questions.  Due to long hold times on the telephone, sending your provider a message by [facility] may be a faster and more efficient way to get a response.  Please allow 48 business hours for a response.  Please remember that this is for non-urgent requests.  _______________________________________________________

## 2022-10-30 ENCOUNTER — Other Ambulatory Visit: Payer: Self-pay | Admitting: Surgery

## 2022-11-06 ENCOUNTER — Encounter: Payer: Self-pay | Admitting: Internal Medicine

## 2022-11-06 ENCOUNTER — Ambulatory Visit: Payer: BC Managed Care – PPO | Admitting: Internal Medicine

## 2022-11-06 VITALS — BP 140/70 | HR 70 | Temp 98.3°F | Ht 71.0 in | Wt 169.1 lb

## 2022-11-06 DIAGNOSIS — E559 Vitamin D deficiency, unspecified: Secondary | ICD-10-CM

## 2022-11-06 DIAGNOSIS — R7989 Other specified abnormal findings of blood chemistry: Secondary | ICD-10-CM

## 2022-11-06 DIAGNOSIS — E1151 Type 2 diabetes mellitus with diabetic peripheral angiopathy without gangrene: Secondary | ICD-10-CM | POA: Diagnosis not present

## 2022-11-06 DIAGNOSIS — Z0000 Encounter for general adult medical examination without abnormal findings: Secondary | ICD-10-CM

## 2022-11-06 DIAGNOSIS — I10 Essential (primary) hypertension: Secondary | ICD-10-CM

## 2022-11-06 DIAGNOSIS — Z23 Encounter for immunization: Secondary | ICD-10-CM

## 2022-11-06 DIAGNOSIS — N1832 Chronic kidney disease, stage 3b: Secondary | ICD-10-CM | POA: Diagnosis not present

## 2022-11-06 DIAGNOSIS — E785 Hyperlipidemia, unspecified: Secondary | ICD-10-CM

## 2022-11-06 DIAGNOSIS — Z114 Encounter for screening for human immunodeficiency virus [HIV]: Secondary | ICD-10-CM

## 2022-11-06 DIAGNOSIS — E1169 Type 2 diabetes mellitus with other specified complication: Secondary | ICD-10-CM

## 2022-11-06 NOTE — Progress Notes (Signed)
Established Patient Office Visit     CC/Reason for Visit: Annual preventive exam  HPI: Jacob Arnold is a 62 y.o. male who is coming in today for the above mentioned reasons. Past Medical History is significant for: Hypertension, hyperlipidemia, type 2 diabetes, peripheral artery disease, stage IIa chronic kidney disease.  He has been feeling well.  He has routine eye and dental care.  He is overdue for Tdap and shingles vaccines.  He will be having his colonoscopy in August.   Past Medical/Surgical History: Past Medical History:  Diagnosis Date   Atherosclerosis of native artery of lower extremity (HCC)    Diverticulosis    DM (diabetes mellitus) (HCC)    ED (erectile dysfunction)    Elevated TSH    Hyperglycemia    HYPERLIPIDEMIA    HYPERTENSION    Panic attacks    Systolic murmur    Tobacco abuse    Vitamin D deficiency     Past Surgical History:  Procedure Laterality Date   LEFT HEART CATH AND CORONARY ANGIOGRAPHY N/A 04/12/2022   Procedure: LEFT HEART CATH AND CORONARY ANGIOGRAPHY;  Surgeon: Lyn Records, MD;  Location: [facility];  Service: Cardiovascular;  Laterality: N/A;   TONSILLECTOMY     VASECTOMY      Social History:  reports that he quit smoking about 2 years ago. His smoking use included cigarettes. He started smoking about 39 years ago. He has a 37 pack-year smoking history. He has never used smokeless tobacco. He reports current alcohol use of about 6.0 standard drinks of alcohol per week. He reports that he does not use drugs.  Allergies: Allergies  Allergen Reactions   Diphenhydramine Hcl Itching and Swelling    Benadryl*     Family History:  Family History  Problem Relation Age of Onset   Breast cancer Mother    Liver cancer Mother    Diabetes Father    Lung cancer Father    Hyperlipidemia Neg Hx        family hx   Heart disease Neg Hx        family hx   Hypertension Neg Hx        family hx   Cancer Neg Hx         family hx   Colon cancer Neg Hx    Stomach cancer Neg Hx      Current Outpatient Medications:    amLODipine (NORVASC) 10 MG tablet, TAKE 1 TABLET BY MOUTH EVERY DAY, Disp: 90 tablet, Rfl: 1   aspirin 81 MG tablet, Take 1 tablet (81 mg total) by mouth daily., Disp: 30 tablet, Rfl:    atorvastatin (LIPITOR) 80 MG tablet, TAKE 1 TABLET BY MOUTH EVERY DAY, Disp: 90 tablet, Rfl: 1   cilostazol (PLETAL) 100 MG tablet, TAKE 1 TABLET BY MOUTH TWICE A DAY BEFORE MEALS, Disp: 180 tablet, Rfl: 3   ezetimibe (ZETIA) 10 MG tablet, TAKE 1 TABLET BY MOUTH EVERY DAY, Disp: 90 tablet, Rfl: 1   FARXIGA 10 MG TABS tablet, Take 10 mg by mouth daily., Disp: , Rfl:    hydrALAZINE (APRESOLINE) 50 MG tablet, TAKE 1.5 TABLETS (75 MG TOTAL) BY MOUTH 3 (THREE) TIMES DAILY., Disp: 405 tablet, Rfl: 1   labetalol (NORMODYNE) 200 MG tablet, TAKE 2 TABLETS BY MOUTH TWICE A DAY, Disp: 360 tablet, Rfl: 0   lisinopril (ZESTRIL) 10 MG tablet, TAKE 1 TABLET BY MOUTH EVERY DAY, Disp: 90 tablet, Rfl: 1  nitroGLYCERIN (NITROSTAT) 0.4 MG SL tablet, Dissolve 1 tablet under the tongue every 5 minutes as needed for chest pain. Max of 3 doses, then 911., Disp: 25 tablet, Rfl: 6   Omega-3 Fatty Acids (FISH OIL) 1000 MG CAPS, Take 2,000 mg by mouth daily., Disp: , Rfl:    sodium bicarbonate 650 MG tablet, Take 650 mg by mouth 2 (two) times daily., Disp: , Rfl:   Current Facility-Administered Medications:    sodium chloride flush (NS) 0.9 % injection 3 mL, 3 mL, Intravenous, Q12H, Wendall Stade, MD  Review of Systems:  Negative unless indicated in HPI.   Physical Exam: Vitals:   11/06/22 1429 11/06/22 1433  BP: (!) 150/80 (!) 140/70  Pulse: 70   Temp: 98.3 F (36.8 C)   TempSrc: Oral   SpO2: 97%   Weight: 169 lb 1.6 oz (76.7 kg)   Height: 5\' 11"  (1.803 m)     Body mass index is 23.58 kg/m.   Physical Exam Vitals reviewed.  Constitutional:      General: He is not in acute distress.    Appearance: Normal  appearance. He is not ill-appearing, toxic-appearing or diaphoretic.  HENT:     Head: Normocephalic.     Right Ear: Tympanic membrane, ear canal and external ear normal. There is no impacted cerumen.     Left Ear: Tympanic membrane, ear canal and external ear normal. There is no impacted cerumen.     Nose: Nose normal.     Mouth/Throat:     Mouth: Mucous membranes are moist.     Pharynx: Oropharynx is clear. No oropharyngeal exudate or posterior oropharyngeal erythema.  Eyes:     General: No scleral icterus.       Right eye: No discharge.        Left eye: No discharge.     Conjunctiva/sclera: Conjunctivae normal.     Pupils: Pupils are equal, round, and reactive to light.  Neck:     Vascular: No carotid bruit.  Cardiovascular:     Rate and Rhythm: Normal rate and regular rhythm.     Pulses: Normal pulses.     Heart sounds: Normal heart sounds.  Pulmonary:     Effort: Pulmonary effort is normal. No respiratory distress.     Breath sounds: Normal breath sounds.  Abdominal:     General: Abdomen is flat. Bowel sounds are normal.     Palpations: Abdomen is soft.  Musculoskeletal:        General: Normal range of motion.     Cervical back: Normal range of motion.  Skin:    General: Skin is warm and dry.  Neurological:     General: No focal deficit present.     Mental Status: He is alert and oriented to person, place, and time. Mental status is at baseline.  Psychiatric:        Mood and Affect: Mood normal.        Behavior: Behavior normal.        Thought Content: Thought content normal.        Judgment: Judgment normal.     Impression and Plan:  Encounter for preventive health examination -     PSA; Future  Type 2 diabetes mellitus with diabetic peripheral angiopathy without gangrene, without long-term current use of insulin (HCC) -     CBC with Differential/Platelet; Future -     Comprehensive metabolic panel; Future -     Hemoglobin A1c; Future -  Microalbumin /  creatinine urine ratio; Future  Essential hypertension  Stage 3b chronic kidney disease (HCC)  Vitamin D deficiency -     VITAMIN D 25 Hydroxy (Vit-D Deficiency, Fractures); Future  Elevated TSH -     TSH; Future  Hyperlipidemia associated with type 2 diabetes mellitus (HCC) -     Lipid panel; Future  Encounter for screening for HIV -     HIV Antibody (routine testing w rflx); Future  Immunization due   -Recommend routine eye and dental care. -Healthy lifestyle discussed in detail. -Labs to be updated today. -Prostate cancer screening: PSA today Health Maintenance  Topic Date Due   Eye exam for diabetics  Never done   HIV Screening  Never done   Zoster (Shingles) Vaccine (1 of 2) Never done   COVID-19 Vaccine (3 - 2023-24 season) 12/23/2021   Hemoglobin A1C  07/25/2022   Yearly kidney health urinalysis for diabetes  09/02/2022   Colon Cancer Screening  02/06/2023*   DTaP/Tdap/Td vaccine (2 - Td or Tdap) 02/21/2023   Screening for Lung Cancer  03/28/2023   Yearly kidney function blood test for diabetes  05/07/2023   Complete foot exam   11/06/2023   Hepatitis C Screening  Completed   HPV Vaccine  Aged Out   Flu Shot  Discontinued  *Topic was postponed. The date shown is not the original due date.    -Tdap given today. Declines shingles vaccination.     Chaya Jan, MD [facility]

## 2022-11-07 ENCOUNTER — Other Ambulatory Visit (INDEPENDENT_AMBULATORY_CARE_PROVIDER_SITE_OTHER): Payer: BC Managed Care – PPO

## 2022-11-07 DIAGNOSIS — E559 Vitamin D deficiency, unspecified: Secondary | ICD-10-CM

## 2022-11-07 DIAGNOSIS — E1169 Type 2 diabetes mellitus with other specified complication: Secondary | ICD-10-CM | POA: Diagnosis not present

## 2022-11-07 DIAGNOSIS — Z125 Encounter for screening for malignant neoplasm of prostate: Secondary | ICD-10-CM

## 2022-11-07 DIAGNOSIS — Z0000 Encounter for general adult medical examination without abnormal findings: Secondary | ICD-10-CM

## 2022-11-07 DIAGNOSIS — E785 Hyperlipidemia, unspecified: Secondary | ICD-10-CM

## 2022-11-07 DIAGNOSIS — E1151 Type 2 diabetes mellitus with diabetic peripheral angiopathy without gangrene: Secondary | ICD-10-CM

## 2022-11-07 DIAGNOSIS — Z114 Encounter for screening for human immunodeficiency virus [HIV]: Secondary | ICD-10-CM

## 2022-11-07 DIAGNOSIS — R7989 Other specified abnormal findings of blood chemistry: Secondary | ICD-10-CM | POA: Diagnosis not present

## 2022-11-07 LAB — COMPREHENSIVE METABOLIC PANEL
ALT: 35 U/L (ref 0–53)
AST: 21 U/L (ref 0–37)
Albumin: 4.7 g/dL (ref 3.5–5.2)
Alkaline Phosphatase: 85 U/L (ref 39–117)
BUN: 18 mg/dL (ref 6–23)
CO2: 24 mEq/L (ref 19–32)
Calcium: 10.5 mg/dL (ref 8.4–10.5)
Chloride: 104 mEq/L (ref 96–112)
Creatinine, Ser: 1.52 mg/dL — ABNORMAL HIGH (ref 0.40–1.50)
GFR: 49.35 mL/min — ABNORMAL LOW (ref >60.00)
Glucose, Bld: 139 mg/dL — ABNORMAL HIGH (ref 70–99)
Potassium: 4.1 mEq/L (ref 3.5–5.1)
Sodium: 137 mEq/L (ref 135–145)
Total Bilirubin: 0.9 mg/dL (ref 0.2–1.2)
Total Protein: 7.2 g/dL (ref 6.0–8.3)

## 2022-11-07 LAB — CBC WITH DIFFERENTIAL/PLATELET
Basophils Absolute: 0.1 10*3/uL (ref 0.0–0.1)
Basophils Relative: 0.9 % (ref 0.0–3.0)
Eosinophils Absolute: 0.7 10*3/uL (ref 0.0–0.7)
Eosinophils Relative: 8.2 % — ABNORMAL HIGH (ref 0.0–5.0)
HCT: 49.8 % (ref 39.0–52.0)
Hemoglobin: 16.7 g/dL (ref 13.0–17.0)
Lymphocytes Relative: 33.6 % (ref 12.0–46.0)
Lymphs Abs: 2.7 10*3/uL (ref 0.7–4.0)
MCHC: 33.4 g/dL (ref 30.0–36.0)
MCV: 96.4 fl (ref 78.0–100.0)
Monocytes Absolute: 0.9 10*3/uL (ref 0.1–1.0)
Monocytes Relative: 11.1 % (ref 3.0–12.0)
Neutro Abs: 3.8 10*3/uL (ref 1.4–7.7)
Neutrophils Relative %: 46.2 % (ref 43.0–77.0)
Platelets: 250 10*3/uL (ref 150.0–400.0)
RBC: 5.17 Mil/uL (ref 4.22–5.81)
RDW: 14 % (ref 11.5–15.5)
WBC: 8.1 10*3/uL (ref 4.0–10.5)

## 2022-11-07 LAB — LIPID PANEL
Cholesterol: 103 mg/dL (ref 0–200)
HDL: 38.9 mg/dL — ABNORMAL LOW (ref >39.00)
LDL Cholesterol: 29 mg/dL (ref 0–99)
NonHDL: 64.26
Total CHOL/HDL Ratio: 3
Triglycerides: 175 mg/dL — ABNORMAL HIGH (ref 0.0–149.0)
VLDL: 35 mg/dL (ref 0.0–40.0)

## 2022-11-07 LAB — MICROALBUMIN / CREATININE URINE RATIO
Creatinine,U: 88.5 mg/dL
Microalb Creat Ratio: 19.4 mg/g (ref 0.0–30.0)
Microalb, Ur: 17.1 mg/dL — ABNORMAL HIGH (ref 0.0–1.9)

## 2022-11-07 LAB — VITAMIN D 25 HYDROXY (VIT D DEFICIENCY, FRACTURES): VITD: 32.64 ng/mL (ref 30.00–100.00)

## 2022-11-07 LAB — PSA: PSA: 0.9 ng/mL (ref 0.10–4.00)

## 2022-11-07 LAB — TSH: TSH: 7.13 u[IU]/mL — ABNORMAL HIGH (ref 0.35–5.50)

## 2022-11-07 LAB — HEMOGLOBIN A1C: Hgb A1c MFr Bld: 6 % (ref 4.6–6.5)

## 2022-11-08 LAB — HIV ANTIBODY (ROUTINE TESTING W REFLEX): HIV 1&2 Ab, 4th Generation: NONREACTIVE

## 2022-11-09 ENCOUNTER — Other Ambulatory Visit: Payer: Self-pay | Admitting: *Deleted

## 2022-11-09 ENCOUNTER — Ambulatory Visit (INDEPENDENT_AMBULATORY_CARE_PROVIDER_SITE_OTHER): Payer: BC Managed Care – PPO

## 2022-11-09 ENCOUNTER — Other Ambulatory Visit: Payer: Self-pay | Admitting: Internal Medicine

## 2022-11-09 DIAGNOSIS — R7989 Other specified abnormal findings of blood chemistry: Secondary | ICD-10-CM

## 2022-11-09 DIAGNOSIS — E0590 Thyrotoxicosis, unspecified without thyrotoxic crisis or storm: Secondary | ICD-10-CM

## 2022-11-09 LAB — T4, FREE: Free T4: 1.01 ng/dL (ref 0.60–1.60)

## 2022-11-09 LAB — TSH: TSH: 6.84 u[IU]/mL — ABNORMAL HIGH (ref 0.35–5.50)

## 2022-11-09 LAB — T3, FREE: T3, Free: 4.2 pg/mL (ref 2.3–4.2)

## 2022-11-09 MED ORDER — LEVOTHYROXINE SODIUM 50 MCG PO TABS: 50.0000 ug | ORAL_TABLET | Freq: Every day | ORAL | 1 refills | Status: DC

## 2022-11-13 ENCOUNTER — Ambulatory Visit (HOSPITAL_BASED_OUTPATIENT_CLINIC_OR_DEPARTMENT_OTHER)
Admission: RE | Admit: 2022-11-13 | Discharge: 2022-11-13 | Disposition: A | Discharge status: Home / Self Care | Payer: BC Managed Care – PPO | Source: Ambulatory Visit | Attending: Acute Care | Admitting: Acute Care

## 2022-11-13 ENCOUNTER — Encounter (HOSPITAL_BASED_OUTPATIENT_CLINIC_OR_DEPARTMENT_OTHER): Payer: Self-pay

## 2022-11-13 DIAGNOSIS — I700 Atherosclerosis of aorta: Secondary | ICD-10-CM | POA: Diagnosis not present

## 2022-11-13 DIAGNOSIS — I719 Aortic aneurysm of unspecified site, without rupture: Secondary | ICD-10-CM | POA: Diagnosis not present

## 2022-11-13 DIAGNOSIS — J439 Emphysema, unspecified: Secondary | ICD-10-CM | POA: Insufficient documentation

## 2022-11-13 DIAGNOSIS — Z87891 Personal history of nicotine dependence: Secondary | ICD-10-CM | POA: Insufficient documentation

## 2022-11-13 DIAGNOSIS — Z122 Encounter for screening for malignant neoplasm of respiratory organs: Secondary | ICD-10-CM | POA: Diagnosis not present

## 2022-11-18 ENCOUNTER — Other Ambulatory Visit: Payer: Self-pay | Admitting: Internal Medicine

## 2022-11-18 DIAGNOSIS — I10 Essential (primary) hypertension: Secondary | ICD-10-CM

## 2022-11-18 DIAGNOSIS — E785 Hyperlipidemia, unspecified: Secondary | ICD-10-CM

## 2022-11-21 ENCOUNTER — Other Ambulatory Visit: Payer: Self-pay

## 2022-11-21 DIAGNOSIS — Z122 Encounter for screening for malignant neoplasm of respiratory organs: Secondary | ICD-10-CM

## 2022-11-21 DIAGNOSIS — Z87891 Personal history of nicotine dependence: Secondary | ICD-10-CM

## 2022-12-01 ENCOUNTER — Encounter: Payer: Self-pay | Admitting: Internal Medicine

## 2022-12-12 ENCOUNTER — Ambulatory Visit (AMBULATORY_SURGERY_CENTER): Payer: BC Managed Care – PPO | Admitting: Internal Medicine

## 2022-12-12 ENCOUNTER — Encounter: Payer: Self-pay | Admitting: Internal Medicine

## 2022-12-12 VITALS — BP 126/68 | HR 68 | Temp 98.0°F | Resp 18 | Ht 70.0 in | Wt 172.0 lb

## 2022-12-12 DIAGNOSIS — D122 Benign neoplasm of ascending colon: Secondary | ICD-10-CM | POA: Diagnosis not present

## 2022-12-12 DIAGNOSIS — D125 Benign neoplasm of sigmoid colon: Secondary | ICD-10-CM | POA: Diagnosis not present

## 2022-12-12 DIAGNOSIS — K635 Polyp of colon: Secondary | ICD-10-CM | POA: Diagnosis not present

## 2022-12-12 DIAGNOSIS — D123 Benign neoplasm of transverse colon: Secondary | ICD-10-CM | POA: Diagnosis not present

## 2022-12-12 DIAGNOSIS — Z1211 Encounter for screening for malignant neoplasm of colon: Secondary | ICD-10-CM

## 2022-12-12 MED ORDER — SODIUM CHLORIDE 0.9 % IV SOLN: 500.0000 mL | Freq: Once | INTRAVENOUS | Status: DC

## 2022-12-12 NOTE — Progress Notes (Unsigned)
Sedate, gd SR, tolerated procedure well, VSS, report to RN 

## 2022-12-12 NOTE — Op Note (Signed)
[facility] Patient Name: Jacob Arnold Procedure Date: 12/12/2022 4:03 PM MRN: [id number] Endoscopist: Beverley Fiedler , MD, [id number] Age: 62 Referring MD:  Date of Birth: 06/12/1960 Gender: Male Account #: [id number] Procedure:                Colonoscopy Indications:              Screening for colorectal malignant neoplasm, Last                            colonoscopy 10 years ago Medicines:                Monitored Anesthesia Care Procedure:                Pre-Anesthesia Assessment:                           - Prior to the procedure, a History and Physical                            was performed, and patient medications and                            allergies were reviewed. The patient's tolerance of                            previous anesthesia was also reviewed. The risks                            and benefits of the procedure and the sedation                            options and risks were discussed with the patient.                            All questions were answered, and informed consent                            was obtained. Prior Anticoagulants: The patient has                            taken Pletal (cilostazol), last dose was 7 days                            prior to procedure. ASA Grade Assessment: III - A                            patient with severe systemic disease. After                            reviewing the risks and benefits, the patient was                            deemed in satisfactory condition to undergo the  procedure.                           After obtaining informed consent, the colonoscope                            was passed under direct vision. Throughout the                            procedure, the patient's blood pressure, pulse, and                            oxygen saturations were monitored continuously. The                            CF [id number] #[id number] was introduced through the anus                             and advanced to the cecum, identified by                            appendiceal orifice and ileocecal valve. The                            colonoscopy was performed without difficulty. The                            patient tolerated the procedure well. The quality                            of the bowel preparation was good. The ileocecal                            valve, appendiceal orifice, and rectum were                            photographed. Scope In: 4:13:31 PM Scope Out: 4:34:28 PM Scope Withdrawal Time: 0 hours 17 minutes 41 seconds  Total Procedure Duration: 0 hours 20 minutes 57 seconds  Findings:                 The digital rectal exam was normal.                           A 5 mm polyp was found in the ascending colon. The                            polyp was sessile. The polyp was removed with a                            cold snare. Resection and retrieval were complete.                           A 6 mm polyp was found in the hepatic flexure. The  polyp was sessile. The polyp was removed with a                            cold snare. Resection and retrieval were complete.                           Two sessile polyps were found in the transverse                            colon. The polyps were 6 to 7 mm in size. These                            polyps were removed with a cold snare. Resection                            and retrieval were complete.                           A 6 mm polyp was found in the descending colon. The                            polyp was sessile. The polyp was removed with a                            cold snare. Resection and retrieval were complete.                           A 5 mm polyp was found in the sigmoid colon. The                            polyp was sessile. The polyp was removed with a                            cold snare. Resection and retrieval were complete.                           A few  small-mouthed diverticula were found in the                            sigmoid colon.                           Internal hemorrhoids were found during                            retroflexion. The hemorrhoids were small. Complications:            No immediate complications. Estimated Blood Loss:     Estimated blood loss was minimal. Impression:               - One 5 mm polyp in the ascending colon, removed  with a cold snare. Resected and retrieved.                           - One 6 mm polyp at the hepatic flexure, removed                            with a cold snare. Resected and retrieved.                           - Two 6 to 7 mm polyps in the transverse colon,                            removed with a cold snare. Resected and retrieved.                           - One 6 mm polyp in the descending colon, removed                            with a cold snare. Resected and retrieved.                           - One 5 mm polyp in the sigmoid colon, removed with                            a cold snare. Resected and retrieved.                           - Mild diverticulosis in the sigmoid colon.                           - Small internal hemorrhoids. Recommendation:           - Patient has a contact number available for                            emergencies. The signs and symptoms of potential                            delayed complications were discussed with the                            patient. Return to normal activities tomorrow.                            Written discharge instructions were provided to the                            patient.                           - Resume previous diet.                           - Continue present medications.                           -  Resume Pletal (cilostazol) at prior dose today.                            Refer to managing physician for further adjustment                            of therapy.                            - Await pathology results.                           - Repeat colonoscopy is recommended for                            surveillance. The colonoscopy date will be                            determined after pathology results from today's                            exam become available for review. Beverley Fiedler, MD 12/12/2022 4:39:14 PM This report has been signed electronically.

## 2022-12-12 NOTE — Patient Instructions (Signed)
Handouts provided on polyps, diverticulosis and hemorrhoids.  Resume previous diet.  Continue present medications.  Resume Pletal (cilostazol) at prior dose today. Refer to managing physician for further adjustment of therapy.  Await pathology results.  Repeat colonoscopy is recommended for surveillance. The colonoscopy date will be determined after pathology results from today's exam become available for review.   YOU HAD AN ENDOSCOPIC PROCEDURE TODAY AT THE [facility]:   Refer to the procedure report that was given to you for any specific questions about what was found during the examination.  If the procedure report does not answer your questions, please call your gastroenterologist to clarify.  If you requested that your care partner not be given the details of your procedure findings, then the procedure report has been included in a sealed envelope for you to review at your convenience later.  YOU SHOULD EXPECT: Some feelings of bloating in the abdomen. Passage of more gas than usual.  Walking can help get rid of the air that was put into your GI tract during the procedure and reduce the bloating. If you had a lower endoscopy (such as a colonoscopy or flexible sigmoidoscopy) you may notice spotting of blood in your stool or on the toilet paper. If you underwent a bowel prep for your procedure, you may not have a normal bowel movement for a few days.  Please Note:  You might notice some irritation and congestion in your nose or some drainage.  This is from the oxygen used during your procedure.  There is no need for concern and it should clear up in a day or so.  SYMPTOMS TO REPORT IMMEDIATELY:  Following lower endoscopy (colonoscopy or flexible sigmoidoscopy):  Excessive amounts of blood in the stool  Significant tenderness or worsening of abdominal pains  Swelling of the abdomen that is new, acute  Fever of 100F or higher  For urgent or emergent issues, a gastroenterologist  can be reached at any hour by calling [phone]. Do not use MyChart messaging for urgent concerns.    DIET:  We do recommend a small meal at first, but then you may proceed to your regular diet.  Drink plenty of fluids but you should avoid alcoholic beverages for 24 hours.  ACTIVITY:  You should plan to take it easy for the rest of today and you should NOT DRIVE or use heavy machinery until tomorrow (because of the sedation medicines used during the test).    FOLLOW UP: Our staff will call the number listed on your records the next business day following your procedure.  We will call around 7:15- 8:00 am to check on you and address any questions or concerns that you may have regarding the information given to you following your procedure. If we do not reach you, we will leave a message.     If any biopsies were taken you will be contacted by phone or by letter within the next 1-3 weeks.  Please call us at [phone] if you have not heard about the biopsies in 3 weeks.    SIGNATURES/CONFIDENTIALITY: You and/or your care partner have signed paperwork which will be entered into your electronic medical record.  These signatures attest to the fact that that the information above on your After Visit Summary has been reviewed and is understood.  Full responsibility of the confidentiality of this discharge information lies with you and/or your care-partner.

## 2022-12-12 NOTE — Progress Notes (Unsigned)
Called to room to assist during endoscopic procedure.  Patient ID and intended procedure confirmed with present staff. Received instructions for my participation in the procedure from the performing physician.  

## 2022-12-12 NOTE — Progress Notes (Unsigned)
Pt had dull chest pain on 12/05/22 or 12/06/22 and took Nitroglycerin 0.4mg  SL x1. After less then 5 minutes chest pain was relived. MD and CRNA made aware.

## 2022-12-13 ENCOUNTER — Telehealth: Payer: Self-pay

## 2022-12-13 NOTE — Telephone Encounter (Signed)
  Follow up Call-     12/12/2022    3:25 PM  Call back number  Post procedure Call Back phone  # [phone]  Permission to leave phone message Yes    Follow up call, LVM

## 2022-12-18 ENCOUNTER — Encounter: Payer: Self-pay | Admitting: Internal Medicine

## 2022-12-24 ENCOUNTER — Other Ambulatory Visit: Payer: Self-pay | Admitting: Internal Medicine

## 2022-12-24 DIAGNOSIS — I10 Essential (primary) hypertension: Secondary | ICD-10-CM

## 2022-12-26 ENCOUNTER — Other Ambulatory Visit (INDEPENDENT_AMBULATORY_CARE_PROVIDER_SITE_OTHER): Payer: BC Managed Care – PPO

## 2022-12-26 DIAGNOSIS — E0590 Thyrotoxicosis, unspecified without thyrotoxic crisis or storm: Secondary | ICD-10-CM

## 2022-12-27 LAB — TSH: TSH: 0.9 u[IU]/mL (ref 0.35–5.50)

## 2023-01-18 DIAGNOSIS — D631 Anemia in chronic kidney disease: Secondary | ICD-10-CM | POA: Diagnosis not present

## 2023-01-18 DIAGNOSIS — N1831 Chronic kidney disease, stage 3a: Secondary | ICD-10-CM | POA: Diagnosis not present

## 2023-01-18 DIAGNOSIS — E1122 Type 2 diabetes mellitus with diabetic chronic kidney disease: Secondary | ICD-10-CM | POA: Diagnosis not present

## 2023-01-18 DIAGNOSIS — I129 Hypertensive chronic kidney disease with stage 1 through stage 4 chronic kidney disease, or unspecified chronic kidney disease: Secondary | ICD-10-CM | POA: Diagnosis not present

## 2023-01-19 LAB — LAB REPORT - SCANNED
Creatinine, POC: 75.6 mg/dL
EGFR: 51

## 2023-01-22 ENCOUNTER — Other Ambulatory Visit: Payer: Self-pay | Admitting: Internal Medicine

## 2023-01-23 ENCOUNTER — Ambulatory Visit (HOSPITAL_COMMUNITY): Payer: BC Managed Care – PPO | Attending: Cardiovascular Disease

## 2023-01-23 DIAGNOSIS — I739 Peripheral vascular disease, unspecified: Secondary | ICD-10-CM | POA: Diagnosis not present

## 2023-01-23 DIAGNOSIS — I701 Atherosclerosis of renal artery: Secondary | ICD-10-CM | POA: Diagnosis not present

## 2023-01-23 DIAGNOSIS — I6521 Occlusion and stenosis of right carotid artery: Secondary | ICD-10-CM | POA: Diagnosis not present

## 2023-01-23 DIAGNOSIS — I2510 Atherosclerotic heart disease of native coronary artery without angina pectoris: Secondary | ICD-10-CM | POA: Diagnosis not present

## 2023-01-23 LAB — ECHOCARDIOGRAM COMPLETE
Area-P 1/2: 3.61 cm2
P 1/2 time: 479 ms
S' Lateral: 3.45 cm

## 2023-02-11 ENCOUNTER — Other Ambulatory Visit: Payer: Self-pay | Admitting: Internal Medicine

## 2023-02-11 DIAGNOSIS — I10 Essential (primary) hypertension: Secondary | ICD-10-CM

## 2023-03-27 ENCOUNTER — Ambulatory Visit (HOSPITAL_COMMUNITY)
Admission: RE | Admit: 2023-03-27 | Discharge: 2023-03-27 | Disposition: A | Discharge status: Home / Self Care | Payer: BC Managed Care – PPO | Source: Ambulatory Visit | Attending: Cardiovascular Disease | Admitting: Cardiovascular Disease

## 2023-03-27 DIAGNOSIS — I6521 Occlusion and stenosis of right carotid artery: Secondary | ICD-10-CM | POA: Diagnosis not present

## 2023-03-27 DIAGNOSIS — I701 Atherosclerosis of renal artery: Secondary | ICD-10-CM | POA: Diagnosis not present

## 2023-03-27 DIAGNOSIS — I2510 Atherosclerotic heart disease of native coronary artery without angina pectoris: Secondary | ICD-10-CM | POA: Diagnosis not present

## 2023-03-27 DIAGNOSIS — I739 Peripheral vascular disease, unspecified: Secondary | ICD-10-CM | POA: Diagnosis not present

## 2023-03-28 ENCOUNTER — Telehealth: Payer: Self-pay | Admitting: Cardiovascular Disease

## 2023-03-28 DIAGNOSIS — R0989 Other specified symptoms and signs involving the circulatory and respiratory systems: Secondary | ICD-10-CM

## 2023-03-28 DIAGNOSIS — I701 Atherosclerosis of renal artery: Secondary | ICD-10-CM

## 2023-03-28 NOTE — Telephone Encounter (Signed)
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  F/U duplex in 1 year.  The patient has been notified of the result and verbalized understanding.  All questions (if any) were answered. Ethelda Chick, RN 03/28/2023 5:26 PM

## 2023-03-28 NOTE — Telephone Encounter (Signed)
Pt returning call to nurse for results

## 2023-04-09 ENCOUNTER — Ambulatory Visit: Payer: BC Managed Care – PPO | Admitting: Surgery

## 2023-05-03 ENCOUNTER — Other Ambulatory Visit: Payer: Self-pay | Admitting: Internal Medicine

## 2023-05-14 ENCOUNTER — Ambulatory Visit: Payer: BC Managed Care – PPO | Admitting: Surgery

## 2023-05-14 ENCOUNTER — Encounter: Payer: Self-pay | Admitting: Surgery

## 2023-05-14 ENCOUNTER — Other Ambulatory Visit: Payer: Self-pay | Admitting: Internal Medicine

## 2023-05-14 VITALS — BP 137/71 | HR 73 | Temp 98.3°F | Resp 20 | Ht 70.0 in | Wt 175.0 lb

## 2023-05-14 DIAGNOSIS — I70213 Atherosclerosis of native arteries of extremities with intermittent claudication, bilateral legs: Secondary | ICD-10-CM | POA: Diagnosis not present

## 2023-05-14 DIAGNOSIS — I6523 Occlusion and stenosis of bilateral carotid arteries: Secondary | ICD-10-CM | POA: Diagnosis not present

## 2023-05-14 DIAGNOSIS — I10 Essential (primary) hypertension: Secondary | ICD-10-CM

## 2023-05-14 NOTE — Progress Notes (Signed)
[facility] [place]  Patient name: Jacob Arnold MRN: [id number] DOB: [birth year] Sex: male   REASON FOR VISIT:    Follow up  HISOTRY OF PRESENT ILLNESS:    Jacob Arnold is a 63 y.o. male who I saw in 2022 for claudication.  He is complaining of bilateral calf cramping at 100 yards.  He was started on cilostazol and treated with medical therapy.  He is now able to walk quarter mile before having symptoms.  He denies rest pain or nonhealing wounds.  He is satisfied with his current lifestyle.   He had a carotid ultrasound in December 2023 that showed 60 to 79% right carotid stenosis.  End-diastolic velocity was roughly 782 cm/s.  He remains asymptomatic.  Specifically, he denies numbness or weakness in either extremity.  He denies slurred speech.  He denies amaurosis fugax   The patient is a former smoker having quit 1 year ago.  He is on high-dose statin therapy for hypercholesterolemia.  His blood pressure is managed with an ACE inhibitor.  He has been diagnosed as a borderline diabetic.  He was previously hospitalized for several days for pericarditis many years ago.   PAST MEDICAL HISTORY:   Past Medical History:  Diagnosis Date   Atherosclerosis of native artery of lower extremity (HCC)    Diverticulosis    DM (diabetes mellitus) (HCC)    ED (erectile dysfunction)    Elevated TSH    Hyperglycemia    HYPERLIPIDEMIA    HYPERTENSION    Panic attacks    Systolic murmur    Tobacco abuse    Vitamin D deficiency      FAMILY HISTORY:   Family History  Problem Relation Age of Onset   Breast cancer Mother    Liver cancer Mother    Diabetes Father    Lung cancer Father    Hyperlipidemia Neg Hx        family hx   Heart disease Neg Hx        family hx   Hypertension Neg Hx        family hx   Cancer Neg Hx        family hx   Colon cancer Neg Hx    Stomach cancer Neg Hx     SOCIAL HISTORY:   Social  History   Tobacco Use   Smoking status: Former    Current packs/day: 0.00    Average packs/day: 1 pack/day for 37.0 years (37.0 ttl pk-yrs)    Types: Cigarettes    Start date: 65    Quit date: 2022    Years since quitting: 3.0   Smokeless tobacco: Never   Tobacco comments:    Quit 2022  Substance Use Topics   Alcohol use: Yes    Alcohol/week: 6.0 standard drinks of alcohol    Types: 6 Cans of beer per week     ALLERGIES:   Allergies  Allergen Reactions   Diphenhydramine Hcl Itching and Swelling    Benadryl*      CURRENT MEDICATIONS:   Current Outpatient Medications  Medication Sig Dispense Refill   amLODipine (NORVASC) 10 MG tablet TAKE 1 TABLET BY MOUTH EVERY DAY 90 tablet 1   aspirin 81 MG tablet Take 1 tablet (81 mg total) by mouth daily. 30 tablet    atorvastatin (LIPITOR) 80 MG tablet TAKE 1 TABLET BY MOUTH EVERY DAY 90 tablet 1   cilostazol (PLETAL) 100 MG tablet TAKE 1 TABLET BY  MOUTH TWICE A DAY BEFORE MEALS 180 tablet 3   ezetimibe (ZETIA) 10 MG tablet TAKE 1 TABLET BY MOUTH EVERY DAY 90 tablet 1   FARXIGA 10 MG TABS tablet Take 10 mg by mouth daily.     hydrALAZINE (APRESOLINE) 50 MG tablet TAKE 1.5 TABLETS (75 MG TOTAL) BY MOUTH 3 (THREE) TIMES DAILY. 405 tablet 1   labetalol (NORMODYNE) 200 MG tablet TAKE 2 TABLETS BY MOUTH TWICE A DAY 360 tablet 0   levothyroxine (SYNTHROID) 50 MCG tablet TAKE 1 TABLET BY MOUTH EVERY DAY 90 tablet 1   lisinopril (ZESTRIL) 10 MG tablet TAKE 1 TABLET BY MOUTH EVERY DAY 90 tablet 1   nitroGLYCERIN (NITROSTAT) 0.4 MG SL tablet Dissolve 1 tablet under the tongue every 5 minutes as needed for chest pain. Max of 3 doses, then 911. 25 tablet 6   Omega-3 Fatty Acids (FISH OIL) 1000 MG CAPS Take 2,000 mg by mouth daily.     sodium bicarbonate 650 MG tablet Take 650 mg by mouth 2 (two) times daily.     Current Facility-Administered Medications  Medication Dose Route Frequency Provider Last Rate Last Admin   sodium chloride flush  (NS) 0.9 % injection 3 mL  3 mL Intravenous Q12H Wendall Stade, MD        REVIEW OF SYSTEMS:   [X]  denotes positive finding, [ ]  denotes negative finding Cardiac  Comments:  Chest pain or chest pressure:    Shortness of breath upon exertion:    Short of breath when lying flat:    Irregular heart rhythm:        Vascular    Pain in calf, thigh, or hip brought on by ambulation:    Pain in feet at night that wakes you up from your sleep:     Blood clot in your veins:    Leg swelling:         Pulmonary    Oxygen at home:    Productive cough:     Wheezing:         Neurologic    Sudden weakness in arms or legs:     Sudden numbness in arms or legs:     Sudden onset of difficulty speaking or slurred speech:    Temporary loss of vision in one eye:     Problems with dizziness:         Gastrointestinal    Blood in stool:     Vomited blood:         Genitourinary    Burning when urinating:     Blood in urine:        Psychiatric    Major depression:         Hematologic    Bleeding problems:    Problems with blood clotting too easily:        Skin    Rashes or ulcers:        Constitutional    Fever or chills:      PHYSICAL EXAM:   Vitals:   05/14/23 0925 05/14/23 0927  BP: (!) 159/79 137/71  Pulse: 73   Resp: 20   Temp: 98.3 F (36.8 C)   SpO2: 95%   Weight: 175 lb (79.4 kg)   Height: 5\' 10"  (1.778 m)     GENERAL: The patient is a well-nourished male, in no acute distress. The vital signs are documented above. CARDIAC: There is a regular rate and rhythm.  PULMONARY: Non-labored respirations MUSCULOSKELETAL: There are no  major deformities or cyanosis. NEUROLOGIC: No focal weakness or paresthesias are detected. SKIN: There are no ulcers or rashes noted. PSYCHIATRIC: The patient has a normal affect.  STUDIES:   I have reviewed the following carotid ultrasound: Right Carotid: Velocities in the right ICA are consistent with a 40-59%                 stenosis.    Left Carotid: Velocities in the left ICA are consistent with a 1-39%  stenosis.   Vertebrals: Left vertebral artery demonstrates antegrade flow. Small  caliber              right vertebral artery with atypical antegrade flow.  Subclavians: Normal flow hemodynamics were seen in bilateral subclavian               arteries.   MEDICAL ISSUES:   PAD with claudication: The patient currently is without symptoms.  He remains on cilostazol.  I will plan on follow-up with ABIs in 1 year  Carotid: Previous ultrasound showed 60-79% right-sided stenosis.  Repeat study 1 month ago, is in the 40-59% right-sided stenosis.  He is asymptomatic.  I will plan on repeating his ultrasound in 1 year   Durene Cal, IV, MD, FACS [facility] [phone] Pager [phone]

## 2023-05-15 ENCOUNTER — Other Ambulatory Visit: Payer: Self-pay | Admitting: Internal Medicine

## 2023-05-15 DIAGNOSIS — I10 Essential (primary) hypertension: Secondary | ICD-10-CM

## 2023-05-15 DIAGNOSIS — E785 Hyperlipidemia, unspecified: Secondary | ICD-10-CM

## 2023-05-21 DIAGNOSIS — E559 Vitamin D deficiency, unspecified: Secondary | ICD-10-CM | POA: Diagnosis not present

## 2023-05-21 DIAGNOSIS — I129 Hypertensive chronic kidney disease with stage 1 through stage 4 chronic kidney disease, or unspecified chronic kidney disease: Secondary | ICD-10-CM | POA: Diagnosis not present

## 2023-05-21 DIAGNOSIS — N1831 Chronic kidney disease, stage 3a: Secondary | ICD-10-CM | POA: Diagnosis not present

## 2023-05-21 DIAGNOSIS — E1122 Type 2 diabetes mellitus with diabetic chronic kidney disease: Secondary | ICD-10-CM | POA: Diagnosis not present

## 2023-05-21 DIAGNOSIS — D631 Anemia in chronic kidney disease: Secondary | ICD-10-CM | POA: Diagnosis not present

## 2023-05-21 LAB — BASIC METABOLIC PANEL
BUN: 27 — AB (ref 4–21)
CO2: 26 — AB (ref 13–22)
Chloride: 103 (ref 99–108)
Creatinine: 1.8 — AB (ref 0.6–1.3)
Glucose: 129
Potassium: 4.2 meq/L (ref 3.5–5.1)
Sodium: 138 (ref 137–147)

## 2023-05-21 LAB — CBC AND DIFFERENTIAL
HCT: 49 (ref 41–53)
Hemoglobin: 16.8 (ref 13.5–17.5)
Neutrophils Absolute: 4.8
Platelets: 190 10*3/uL (ref 150–400)
WBC: 9.3

## 2023-05-21 LAB — COMPREHENSIVE METABOLIC PANEL
Albumin: 4.7 (ref 3.5–5.0)
Calcium: 10.1 (ref 8.7–10.7)
eGFR: 42

## 2023-05-21 LAB — CBC: RBC: 5.14 — AB (ref 3.87–5.11)

## 2023-05-23 NOTE — Progress Notes (Signed)
 Office Visit    Patient Name: Jacob Arnold Date of Encounter: 05/29/2023  PCP:  Theophilus Andrews, Tully GRADE, MD   [facility]  Cardiologist:  Maude Emmer, MD    HPI    Jacob Arnold is a 63 y.o. male with a past medical history significant for smoking, hypertension, hyperlipidemia, diabetes mellitus type 2, CRF, and PAD presents today for follow-up appointment.  History significant for a 37-year history of smoking and quit about 3 years ago.  Lung cancer  CT 11/11/2021 with no cancer.  Aortic atherosclerosis and emphysema.  Ascending thoracic aorta 4.2 cm.  TTE 01/26/2022 reviewed with LVEF 55 to 60%, mild LVH, no LVE, mild to moderate AR, aorta 4.1 cm.  ABI 02/28/2021 with right 0.62 and left 0.76 with [facility] follow-up arranged. Pletal  has helped Sees [facility]  He has 60-79% right ICA stenosis by duplex 03/24/22    Cardiac CTA reviewed from 03/27/2022 with calcium  score 502, 91st percentile for age/sex.  RCA total occlusion in mid vessel with distal vessel filling from collaterals.  Mid LAD 50 to 70% and LCx 25 to 49% with negative FFR in these vessels.  Also noted 60 to 79% R ICA stenosis on duplex 03/24/2022.  Occluded/no flow to left kidney on duplex same day.  Cath:  04/12/22 with CTO RCA left to right and right to right collaterals 40-50% LAD To my review not ideal for CTO intervention in future with long segment occlusion and multiple branches   No angina, Claudication stable  Able to mow lawn completely now without stopping No TIA symptoms  On son home d/c from [employer] with Bechet's and one son home with intractable seizures  Has a brother in [place]  he will visit this winter  03/27/23 Carotid 40-59% RICA stenosis 01/23/23 Echo EF 60-65% AV sclerosis mild-mod AR  No angina Active. Sees [facility] for his PVD Good relief of claudication with Pletal     Past Medical History    Past Medical History:  Diagnosis Date   Atherosclerosis of native  artery of lower extremity (HCC)    Diverticulosis    DM (diabetes mellitus) (HCC)    ED (erectile dysfunction)    Elevated TSH    Hyperglycemia    HYPERLIPIDEMIA    HYPERTENSION    Panic attacks    Systolic murmur    Tobacco abuse    Vitamin D  deficiency    Past Surgical History:  Procedure Laterality Date   LEFT HEART CATH AND CORONARY ANGIOGRAPHY N/A 04/12/2022   Procedure: LEFT HEART CATH AND CORONARY ANGIOGRAPHY;  Surgeon: Claudene Victory LELON, MD;  Location: [facility] INVASIVE [facility] LAB;  Service: Cardiovascular;  Laterality: N/A;   TONSILLECTOMY     VASECTOMY      Allergies  Allergies  Allergen Reactions   Diphenhydramine Hcl Itching and Swelling    Benadryl*     EKGs/Labs/Other Studies Reviewed:   The following studies were reviewed today:  Cardiac catheterization 04/12/2022 Left Anterior Descending  Prox LAD to Mid LAD lesion is 50% stenosed.    Right Coronary Artery  Prox RCA to Mid RCA lesion is 100% stenosed.    Right Posterior Descending Artery  Collaterals  RPDA filled by collaterals from Dist LAD.      Right Posterior Atrioventricular Artery  Collaterals  RPAV filled by collaterals from Dist Cx.      First Right Posterolateral Branch  Collaterals  1st RPL filled by collaterals from 2nd Sept.  Intervention   No interventions have been documented.   Left Heart  Left Ventricle LV end diastolic pressure is normal.   Coronary Diagrams  Diagnostic Dominance: Right  Intervention   Implants   No implant documentation for this case.    EKG:  EKG is not ordered today.   Recent Labs: 11/07/2022: ALT 35 12/26/2022: TSH 0.90 05/21/2023: BUN 27; Creatinine 1.8; Hemoglobin 16.8; Platelets 190; Potassium 4.2; Sodium 138  Recent Lipid Panel    Component Value Date/Time   CHOL 103 11/07/2022 0815   TRIG 175.0 (H) 11/07/2022 0815   HDL 38.90 (L) 11/07/2022 0815   CHOLHDL 3 11/07/2022 0815   VLDL 35.0 11/07/2022 0815   LDLCALC 29 11/07/2022 0815    LDLCALC  01/20/2020 0833     Comment:     . LDL cholesterol not calculated. Triglyceride levels greater than 400 mg/dL invalidate calculated LDL results. . Reference range: <100 . Desirable range <100 mg/dL for primary prevention;   <70 mg/dL for patients with CHD or diabetic patients  with > or = 2 CHD risk factors. Jacob LDL-C is now calculated using the Martin-Hopkins  calculation, which is a validated novel method providing  better accuracy than the Friedewald equation in the  estimation of LDL-C.  Gladis APPLETHWAITE et al. Arnold. 7986;689(80): 2061-2068  (http://education.[employer].com/faq/FAQ164)    LDLDIRECT 73.0 01/28/2021 1102    Home Medications   Current Meds  Medication Sig   Cholecalciferol (VITAMIN D3) 50 MCG (2000 UT) capsule Take 2,000 Units by mouth daily.   amLODipine  (NORVASC ) 10 MG tablet TAKE 1 TABLET BY MOUTH EVERY DAY   aspirin  81 MG tablet Take 1 tablet (81 mg total) by mouth daily.   atorvastatin  (LIPITOR) 80 MG tablet TAKE 1 TABLET BY MOUTH EVERY DAY   cilostazol  (PLETAL ) 100 MG tablet TAKE 1 TABLET BY MOUTH TWICE A DAY BEFORE MEALS   ezetimibe  (ZETIA ) 10 MG tablet TAKE 1 TABLET BY MOUTH EVERY DAY   FARXIGA 10 MG TABS tablet Take 10 mg by mouth daily.   hydrALAZINE  (APRESOLINE ) 50 MG tablet TAKE 1.5 TABLETS (75 MG TOTAL) BY MOUTH 3 (THREE) TIMES DAILY.   labetalol  (NORMODYNE ) 200 MG tablet TAKE 2 TABLETS BY MOUTH TWICE A DAY   levothyroxine  (SYNTHROID ) 50 MCG tablet TAKE 1 TABLET BY MOUTH EVERY DAY   lisinopril  (ZESTRIL ) 10 MG tablet TAKE 1 TABLET BY MOUTH EVERY DAY   nitroGLYCERIN  (NITROSTAT ) 0.4 MG SL tablet Dissolve 1 tablet under the tongue every 5 minutes as needed for chest pain. Max of 3 doses, then 911.   Omega-3 Fatty Acids (FISH OIL) 1000 MG CAPS Take 2,000 mg by mouth daily.   sodium bicarbonate 650 MG tablet Take 650 mg by mouth 2 (two) times daily.   Current Facility-Administered Medications for the 05/29/23 encounter (Office Visit) with Delford Maude BROCKS, MD  Medication   sodium chloride  flush (NS) 0.9 % injection 3 mL     Review of Systems      All other systems reviewed and are otherwise negative except as noted above.  Physical Exam    VS:  BP 108/66   Pulse 63   Ht 5' 10 (1.778 m)   Wt 177 lb 6.4 oz (80.5 kg)   SpO2 96%   BMI 25.45 kg/m  , BMI Body mass index is 25.45 kg/m.  Wt Readings from Last 3 Encounters:  05/29/23 177 lb 6.4 oz (80.5 kg)  05/14/23 175 lb (79.4 kg)  12/12/22 172 lb (78 kg)  Affect appropriate Healthy:  appears stated age HEENT: normal Neck supple with no adenopathy JVP normal  right bruits no thyromegaly Lungs clear with no wheezing and good diaphragmatic motion Heart:  S1/S2 no murmur, no rub, gallop or click PMI normal Abdomen: benighn, BS positve, no tenderness, no AAA no bruit.  No HSM or HJR Decreased peripheral pulses normal cap refill  No edema Neuro non-focal Skin warm and dry No muscular weakness   Assessment & Plan    CAD status post cardiac cath with no PCI -continue current medications: Norvasc  10mg  daily, asa 81mg  daily, pletal  100mg  BID, farxiga 10mg  daily, zetia  10mg  daily, hydralazine  75mg  TID, labetalol  200mg  BID, lisinopril  10mg  daily, nitro as needed -chronic CTO of RCA with good collaterals by cath 04/12/22 normal EF   Murmur (mild to moderate AR and AV sclerosis) -updated echo 01/2023 mild/mod AR normal EF no LVE -stable without symptoms  Hypertension -low normal today -continue current medications  Hyperlipidemia -continue zetia  and lipitor -repeat lipid panel 1/8 when he has labs done for nephrology  DM -per PCP  PVD -vascular follow-up arranged -continue pletal  - 40-59% RICS stentosis f/u duplex 03/2024  - ABI 0.99 right and 0.89 on left   CRF -renal follow-up with labs arranged for next week -  Cr 1.42   - Functionally one kidney f/u nephrology   Carotid duplex 03/2024 ABI's per Dr Serene    Disposition: Follow up in a year    Signed, Maude Emmer, MD 05/29/2023, 8:52 AM [facility]

## 2023-05-24 ENCOUNTER — Encounter: Payer: Self-pay | Admitting: Internal Medicine

## 2023-05-29 ENCOUNTER — Ambulatory Visit: Payer: BC Managed Care – PPO | Attending: Cardiovascular Disease | Admitting: Cardiovascular Disease

## 2023-05-29 ENCOUNTER — Encounter: Payer: Self-pay | Admitting: Cardiovascular Disease

## 2023-05-29 VITALS — BP 108/66 | HR 63 | Ht 70.0 in | Wt 177.4 lb

## 2023-05-29 DIAGNOSIS — I10 Essential (primary) hypertension: Secondary | ICD-10-CM

## 2023-05-29 DIAGNOSIS — N189 Chronic kidney disease, unspecified: Secondary | ICD-10-CM

## 2023-05-29 DIAGNOSIS — I2510 Atherosclerotic heart disease of native coronary artery without angina pectoris: Secondary | ICD-10-CM

## 2023-05-29 DIAGNOSIS — E785 Hyperlipidemia, unspecified: Secondary | ICD-10-CM

## 2023-05-29 DIAGNOSIS — I739 Peripheral vascular disease, unspecified: Secondary | ICD-10-CM | POA: Diagnosis not present

## 2023-05-29 DIAGNOSIS — I2584 Coronary atherosclerosis due to calcified coronary lesion: Secondary | ICD-10-CM

## 2023-05-29 NOTE — Patient Instructions (Signed)
Medication Instructions:  Your physician recommends that you continue on your current medications as directed. Please refer to the Current Medication list given to you today.  *If you need a refill on your cardiac medications before your next appointment, please call your pharmacy*  Lab Work: If you have labs (blood work) drawn today and your tests are completely normal, you will receive your results only by: MyChart Message (if you have MyChart) OR A paper copy in the mail If you have any lab test that is abnormal or we need to change your treatment, we will call you to review the results.  Testing/Procedures: Your physician has requested that you have a carotid duplex in December. This test is an ultrasound of the carotid arteries in your neck. It looks at blood flow through these arteries that supply the brain with blood. Allow one hour for this exam. There are no restrictions or special instructions.  Follow-Up: At [facility], you and your health needs are our priority.  As part of our continuing mission to provide you with exceptional heart care, we have created designated Provider Care Teams.  These Care Teams include your primary Cardiologist (physician) and Advanced Practice Providers (APPs -  Physician Assistants and Nurse Practitioners) who all work together to provide you with the care you need, when you need it.  We recommend signing up for the patient portal called "MyChart".  Sign up information is provided on this After Visit Summary.  MyChart is used to connect with patients for Virtual Visits (Telemedicine).  Patients are able to view lab/test results, encounter notes, upcoming appointments, etc.  Non-urgent messages can be sent to your provider as well.   To learn more about what you can do with MyChart, go to [email or web address].    Your next appointment:   1 year(s)  Provider:   Charlton Haws, MD     Other Instructions    1st Floor: - Lobby -  Registration  - Pharmacy  - Lab - Cafe  2nd Floor: - PV Lab - Diagnostic Testing (echo, CT, nuclear med)  3rd Floor: - Vacant  4th Floor: - TCTS (cardiothoracic surgery) - AFib Clinic - Structural Heart Clinic - Vascular Surgery  - Vascular Ultrasound  5th Floor: - HeartCare Cardiology (general and EP) - Clinical Pharmacy for coumadin, hypertension, lipid, weight-loss medications, and med management appointments    Valet parking services will be available as well.

## 2023-06-05 ENCOUNTER — Other Ambulatory Visit: Payer: Self-pay

## 2023-06-05 DIAGNOSIS — I6523 Occlusion and stenosis of bilateral carotid arteries: Secondary | ICD-10-CM

## 2023-06-05 DIAGNOSIS — I70213 Atherosclerosis of native arteries of extremities with intermittent claudication, bilateral legs: Secondary | ICD-10-CM

## 2023-07-30 ENCOUNTER — Other Ambulatory Visit: Payer: Self-pay | Admitting: Internal Medicine

## 2023-07-30 DIAGNOSIS — I10 Essential (primary) hypertension: Secondary | ICD-10-CM

## 2023-09-16 ENCOUNTER — Other Ambulatory Visit: Payer: Self-pay | Admitting: Internal Medicine

## 2023-10-24 NOTE — Progress Notes (Signed)
 [facility] Quality Team Note  Name: Jacob Arnold Date of Birth: [birth year] MRN: [id number] Date: 10/24/2023  [facility] Quality Team has reviewed this patient's chart, please see recommendations below:  [facility] Quality Other; (CHART REVIEWED. PATIENT NEEDS A1C LESS THAN 8.0 FOR GAP CLOSURE. PATIENT ALSO NEEDS URINE MICROALBUMIN/CREATININE RATIO COMPLETED)

## 2023-11-02 ENCOUNTER — Other Ambulatory Visit: Payer: Self-pay | Admitting: Surgery

## 2023-11-02 ENCOUNTER — Other Ambulatory Visit: Payer: Self-pay | Admitting: Internal Medicine

## 2023-11-02 DIAGNOSIS — E785 Hyperlipidemia, unspecified: Secondary | ICD-10-CM

## 2023-11-02 DIAGNOSIS — I10 Essential (primary) hypertension: Secondary | ICD-10-CM

## 2023-11-06 ENCOUNTER — Other Ambulatory Visit: Payer: Self-pay

## 2023-11-06 NOTE — Telephone Encounter (Signed)
 Only needs two refills.  Patient will then need to see provider

## 2023-11-07 ENCOUNTER — Encounter: Payer: Self-pay | Admitting: Internal Medicine

## 2023-11-07 MED ORDER — LEVOTHYROXINE SODIUM 50 MCG PO TABS: 50.0000 ug | ORAL_TABLET | Freq: Every day | ORAL | 0 refills | Status: DC

## 2023-11-12 DIAGNOSIS — N2581 Secondary hyperparathyroidism of renal origin: Secondary | ICD-10-CM | POA: Diagnosis not present

## 2023-11-12 DIAGNOSIS — N1831 Chronic kidney disease, stage 3a: Secondary | ICD-10-CM | POA: Diagnosis not present

## 2023-11-14 ENCOUNTER — Ambulatory Visit (HOSPITAL_BASED_OUTPATIENT_CLINIC_OR_DEPARTMENT_OTHER)
Admission: RE | Admit: 2023-11-14 | Discharge: 2023-11-14 | Disposition: A | Discharge status: Home / Self Care | Source: Ambulatory Visit | Attending: Acute Care | Admitting: Acute Care

## 2023-11-14 DIAGNOSIS — Z87891 Personal history of nicotine dependence: Secondary | ICD-10-CM | POA: Insufficient documentation

## 2023-11-14 DIAGNOSIS — Z122 Encounter for screening for malignant neoplasm of respiratory organs: Secondary | ICD-10-CM | POA: Insufficient documentation

## 2023-11-19 DIAGNOSIS — I129 Hypertensive chronic kidney disease with stage 1 through stage 4 chronic kidney disease, or unspecified chronic kidney disease: Secondary | ICD-10-CM | POA: Diagnosis not present

## 2023-11-19 DIAGNOSIS — N1831 Chronic kidney disease, stage 3a: Secondary | ICD-10-CM | POA: Diagnosis not present

## 2023-11-19 DIAGNOSIS — D631 Anemia in chronic kidney disease: Secondary | ICD-10-CM | POA: Diagnosis not present

## 2023-11-19 DIAGNOSIS — E1122 Type 2 diabetes mellitus with diabetic chronic kidney disease: Secondary | ICD-10-CM | POA: Diagnosis not present

## 2023-11-22 ENCOUNTER — Other Ambulatory Visit: Payer: Self-pay

## 2023-11-22 DIAGNOSIS — Z122 Encounter for screening for malignant neoplasm of respiratory organs: Secondary | ICD-10-CM

## 2023-11-22 DIAGNOSIS — Z87891 Personal history of nicotine dependence: Secondary | ICD-10-CM

## 2023-11-26 ENCOUNTER — Other Ambulatory Visit: Payer: Self-pay

## 2023-11-26 MED ORDER — NITROGLYCERIN 0.4 MG SL SUBL: SUBLINGUAL_TABLET | SUBLINGUAL | 1 refills | Status: AC

## 2024-01-16 ENCOUNTER — Ambulatory Visit (INDEPENDENT_AMBULATORY_CARE_PROVIDER_SITE_OTHER): Admitting: Internal Medicine

## 2024-01-16 VITALS — BP 120/70 | HR 76 | Temp 97.6°F | Ht 71.0 in | Wt 164.9 lb

## 2024-01-16 DIAGNOSIS — E559 Vitamin D deficiency, unspecified: Secondary | ICD-10-CM | POA: Diagnosis not present

## 2024-01-16 DIAGNOSIS — N1832 Chronic kidney disease, stage 3b: Secondary | ICD-10-CM | POA: Diagnosis not present

## 2024-01-16 DIAGNOSIS — I739 Peripheral vascular disease, unspecified: Secondary | ICD-10-CM

## 2024-01-16 DIAGNOSIS — R7989 Other specified abnormal findings of blood chemistry: Secondary | ICD-10-CM

## 2024-01-16 DIAGNOSIS — E1151 Type 2 diabetes mellitus with diabetic peripheral angiopathy without gangrene: Secondary | ICD-10-CM

## 2024-01-16 DIAGNOSIS — I10 Essential (primary) hypertension: Secondary | ICD-10-CM

## 2024-01-16 DIAGNOSIS — E1169 Type 2 diabetes mellitus with other specified complication: Secondary | ICD-10-CM

## 2024-01-16 DIAGNOSIS — Z0000 Encounter for general adult medical examination without abnormal findings: Secondary | ICD-10-CM

## 2024-01-16 DIAGNOSIS — E785 Hyperlipidemia, unspecified: Secondary | ICD-10-CM

## 2024-01-16 DIAGNOSIS — Z125 Encounter for screening for malignant neoplasm of prostate: Secondary | ICD-10-CM

## 2024-01-16 LAB — LIPID PANEL
Cholesterol: 120 mg/dL (ref 0–200)
HDL: 45 mg/dL (ref >39.00)
LDL Cholesterol: 62 mg/dL (ref 0–99)
NonHDL: 75.2
Total CHOL/HDL Ratio: 3
Triglycerides: 66 mg/dL (ref 0.0–149.0)
VLDL: 13.2 mg/dL (ref 0.0–40.0)

## 2024-01-16 LAB — CBC WITH DIFFERENTIAL/PLATELET
Basophils Absolute: 0.1 K/uL (ref 0.0–0.1)
Basophils Relative: 0.6 % (ref 0.0–3.0)
Eosinophils Absolute: 0.7 K/uL (ref 0.0–0.7)
Eosinophils Relative: 6.2 % — ABNORMAL HIGH (ref 0.0–5.0)
HCT: 50.6 % (ref 39.0–52.0)
Hemoglobin: 16.8 g/dL (ref 13.0–17.0)
Lymphocytes Relative: 17.3 % (ref 12.0–46.0)
Lymphs Abs: 2 K/uL (ref 0.7–4.0)
MCHC: 33.2 g/dL (ref 30.0–36.0)
MCV: 96.6 fl (ref 78.0–100.0)
Monocytes Absolute: 0.8 K/uL (ref 0.1–1.0)
Monocytes Relative: 6.5 % (ref 3.0–12.0)
Neutro Abs: 8 K/uL — ABNORMAL HIGH (ref 1.4–7.7)
Neutrophils Relative %: 69.4 % (ref 43.0–77.0)
Platelets: 237 K/uL (ref 150.0–400.0)
RBC: 5.24 Mil/uL (ref 4.22–5.81)
RDW: 13.6 % (ref 11.5–15.5)
WBC: 11.6 K/uL — ABNORMAL HIGH (ref 4.0–10.5)

## 2024-01-16 LAB — COMPREHENSIVE METABOLIC PANEL WITH GFR
ALT: 31 U/L (ref 0–53)
AST: 22 U/L (ref 0–37)
Albumin: 4.7 g/dL (ref 3.5–5.2)
Alkaline Phosphatase: 89 U/L (ref 39–117)
BUN: 29 mg/dL — ABNORMAL HIGH (ref 6–23)
CO2: 23 meq/L (ref 19–32)
Calcium: 10 mg/dL (ref 8.4–10.5)
Chloride: 107 meq/L (ref 96–112)
Creatinine, Ser: 1.76 mg/dL — ABNORMAL HIGH (ref 0.40–1.50)
GFR: 41.05 mL/min — ABNORMAL LOW (ref >60.00)
Glucose, Bld: 122 mg/dL — ABNORMAL HIGH (ref 70–99)
Potassium: 4.5 meq/L (ref 3.5–5.1)
Sodium: 139 meq/L (ref 135–145)
Total Bilirubin: 0.8 mg/dL (ref 0.2–1.2)
Total Protein: 7 g/dL (ref 6.0–8.3)

## 2024-01-16 LAB — VITAMIN B12: Vitamin B-12: 358 pg/mL (ref 211–911)

## 2024-01-16 LAB — TSH: TSH: 1.13 u[IU]/mL (ref 0.35–5.50)

## 2024-01-16 LAB — MICROALBUMIN / CREATININE URINE RATIO
Creatinine,U: 77.7 mg/dL
Microalb Creat Ratio: 52 mg/g — ABNORMAL HIGH (ref 0.0–30.0)
Microalb, Ur: 4 mg/dL — ABNORMAL HIGH (ref 0.0–1.9)

## 2024-01-16 LAB — HEMOGLOBIN A1C: Hgb A1c MFr Bld: 6.3 % (ref 4.6–6.5)

## 2024-01-16 LAB — VITAMIN D 25 HYDROXY (VIT D DEFICIENCY, FRACTURES): VITD: 60.87 ng/mL (ref 30.00–100.00)

## 2024-01-16 LAB — PSA: PSA: 0.85 ng/mL (ref 0.10–4.00)

## 2024-01-16 NOTE — Progress Notes (Signed)
 Established Patient Office Visit     CC/Reason for Visit: Annual preventive exam and follow-up chronic medical conditions  HPI: Jacob Arnold is a 63 y.o. male who is coming in today for the above mentioned reasons. Past Medical History is significant for: Hypertension, hyperlipidemia, type 2 diabetes, vitamin D  deficiency, chronic kidney disease stage IIa, peripheral arterial disease.  Follows with cardiology.  Has his annual low-dose CT scan due to his prior history of smoking.  Has routine eye and dental care.  Is overdue for COVID, flu, pneumonia, shingles vaccinations.   Past Medical/Surgical History: Past Medical History:  Diagnosis Date   Atherosclerosis of native artery of lower extremity    Diverticulosis    DM (diabetes mellitus) (HCC)    ED (erectile dysfunction)    Elevated TSH    Hyperglycemia    HYPERLIPIDEMIA    HYPERTENSION    Panic attacks    Systolic murmur    Tobacco abuse    Vitamin D  deficiency     Past Surgical History:  Procedure Laterality Date   LEFT HEART CATH AND CORONARY ANGIOGRAPHY N/A 04/12/2022   Procedure: LEFT HEART CATH AND CORONARY ANGIOGRAPHY;  Surgeon: Claudene Victory LELON, MD;  Location: [facility];  Service: Cardiovascular;  Laterality: N/A;   TONSILLECTOMY     VASECTOMY      Social History:  reports that he quit smoking about 3 years ago. His smoking use included cigarettes. He started smoking about 40 years ago. He has a 37 pack-year smoking history. He has never used smokeless tobacco. He reports current alcohol use of about 6.0 standard drinks of alcohol per week. He reports that he does not use drugs.  Allergies: No Known Allergies  Family History:  Family History  Problem Relation Age of Onset   Breast cancer Mother    Liver cancer Mother    Diabetes Father    Lung cancer Father    Hyperlipidemia Neg Hx        family hx   Heart disease Neg Hx        family hx   Hypertension Neg Hx        family hx    Cancer Neg Hx        family hx   Colon cancer Neg Hx    Stomach cancer Neg Hx      Current Outpatient Medications:    amLODipine  (NORVASC ) 10 MG tablet, TAKE 1 TABLET BY MOUTH EVERY DAY, Disp: 90 tablet, Rfl: 0   aspirin  81 MG tablet, Take 1 tablet (81 mg total) by mouth daily., Disp: 30 tablet, Rfl:    atorvastatin  (LIPITOR) 80 MG tablet, TAKE 1 TABLET BY MOUTH EVERY DAY, Disp: 90 tablet, Rfl: 0   Cholecalciferol (VITAMIN D3) 50 MCG (2000 UT) capsule, Take 2,000 Units by mouth daily., Disp: , Rfl:    cilostazol  (PLETAL ) 100 MG tablet, TAKE 1 TABLET BY MOUTH TWICE A DAY BEFORE MEALS, Disp: 180 tablet, Rfl: 3   ezetimibe  (ZETIA ) 10 MG tablet, TAKE 1 TABLET BY MOUTH EVERY DAY, Disp: 90 tablet, Rfl: 0   FARXIGA 10 MG TABS tablet, Take 10 mg by mouth daily., Disp: , Rfl:    hydrALAZINE  (APRESOLINE ) 50 MG tablet, TAKE 1.5 TABLETS (75 MG TOTAL) BY MOUTH 3 (THREE) TIMES DAILY., Disp: 405 tablet, Rfl: 1   labetalol  (NORMODYNE ) 200 MG tablet, TAKE 2 TABLETS BY MOUTH TWICE A DAY, Disp: 360 tablet, Rfl: 0   levothyroxine  (SYNTHROID ) 50 MCG tablet,  Take 1 tablet (50 mcg total) by mouth daily., Disp: 90 tablet, Rfl: 0   lisinopril  (ZESTRIL ) 10 MG tablet, TAKE 1 TABLET BY MOUTH EVERY DAY, Disp: 90 tablet, Rfl: 0   nitroGLYCERIN  (NITROSTAT ) 0.4 MG SL tablet, Dissolve 1 tablet under the tongue every 5 minutes as needed for chest pain. Max of 3 doses, then 911., Disp: 75 tablet, Rfl: 1   Omega-3 Fatty Acids (FISH OIL) 1000 MG CAPS, Take 2,000 mg by mouth daily., Disp: , Rfl:    sodium bicarbonate 650 MG tablet, Take 650 mg by mouth 2 (two) times daily., Disp: , Rfl:   Current Facility-Administered Medications:    sodium chloride  flush (NS) 0.9 % injection 3 mL, 3 mL, Intravenous, Q12H, Nishan, Peter C, MD  Review of Systems:  Negative unless indicated in HPI.   Physical Exam: Vitals:   01/16/24 0957  BP: 120/70  Pulse: 76  Temp: 97.6 F (36.4 C)  TempSrc: Oral  SpO2: 98%  Weight: 164 lb 14.4 oz  (74.8 kg)  Height: 5' 11 (1.803 m)    Body mass index is 23 kg/m.   Physical Exam Vitals reviewed.  Constitutional:      General: He is not in acute distress.    Appearance: Normal appearance. He is not ill-appearing, toxic-appearing or diaphoretic.  HENT:     Head: Normocephalic.     Right Ear: Tympanic membrane, ear canal and external ear normal. There is no impacted cerumen.     Left Ear: Tympanic membrane, ear canal and external ear normal. There is no impacted cerumen.     Nose: Nose normal.     Mouth/Throat:     Mouth: Mucous membranes are moist.     Pharynx: Oropharynx is clear. No oropharyngeal exudate or posterior oropharyngeal erythema.  Eyes:     General: No scleral icterus.       Right eye: No discharge.        Left eye: No discharge.     Conjunctiva/sclera: Conjunctivae normal.     Pupils: Pupils are equal, round, and reactive to light.  Neck:     Vascular: No carotid bruit.  Cardiovascular:     Rate and Rhythm: Normal rate and regular rhythm.     Pulses: Normal pulses.     Heart sounds: Normal heart sounds.  Pulmonary:     Effort: Pulmonary effort is normal. No respiratory distress.     Breath sounds: Normal breath sounds.  Abdominal:     General: Abdomen is flat. Bowel sounds are normal.     Palpations: Abdomen is soft.  Musculoskeletal:        General: Normal range of motion.     Cervical back: Normal range of motion.  Skin:    General: Skin is warm and dry.  Neurological:     General: No focal deficit present.     Mental Status: He is alert and oriented to person, place, and time. Mental status is at baseline.  Psychiatric:        Mood and Affect: Mood normal.        Behavior: Behavior normal.        Thought Content: Thought content normal.        Judgment: Judgment normal.     Flowsheet Row Office Visit from 01/16/2024 in [facility]  PHQ-9 Total Score 4    Impression and Plan:  Encounter for preventive  health examination -     PSA; Future  Essential hypertension  Elevated TSH -     TSH; Future  Type 2 diabetes mellitus with diabetic peripheral angiopathy without gangrene, without long-term current use of insulin (HCC) -     Hemoglobin A1c; Future -     CBC with Differential/Platelet; Future -     Comprehensive metabolic panel with GFR; Future -     Microalbumin / creatinine urine ratio; Future  Stage 3b chronic kidney disease (HCC) -     Vitamin B12; Future  Vitamin D  deficiency -     VITAMIN D  25 Hydroxy (Vit-D Deficiency, Fractures); Future  Hyperlipidemia associated with type 2 diabetes mellitus (HCC) -     Lipid panel; Future  PAD (peripheral artery disease)   -Recommend routine eye and dental care. -Healthy lifestyle discussed in detail. -Labs to be updated today. -Prostate cancer screening: PSA today Health Maintenance  Topic Date Due   Eye exam for diabetics  Never done   Yearly kidney health urinalysis for diabetes  Never done   Hemoglobin A1C  05/10/2023   COVID-19 Vaccine (3 - 2025-26 season) 02/01/2024*   Zoster (Shingles) Vaccine (1 of 2) 04/16/2024*   Pneumococcal Vaccine for age over 75 (1 of 2 - PCV) 01/15/2025*   Yearly kidney function blood test for diabetes  05/20/2024   Screening for Lung Cancer  11/13/2024   Complete foot exam   01/15/2025   Colon Cancer Screening  12/11/2025   DTaP/Tdap/Td vaccine (3 - Td or Tdap) 11/05/2032   Hepatitis C Screening  Completed   HIV Screening  Completed   Hepatitis B Vaccine  Aged Out   HPV Vaccine  Aged Out   Meningitis B Vaccine  Aged Out   Flu Shot  Discontinued  *Topic was postponed. The date shown is not the original due date.     -Declines all vaccinations today despite counseling. - PSA today. - Lung and colon cancer screening are up-to-date. - Is scheduled for eye exam next month. - Blood pressure is well-controlled on current. - Check A1c. - Check lipids, continue atorvastatin  and ezetimibe . -  Check TSH, on 50 mcg of levothyroxine .    Tully Theophilus Andrews, MD [facility]

## 2024-01-17 ENCOUNTER — Ambulatory Visit: Payer: Self-pay | Admitting: Internal Medicine

## 2024-01-22 ENCOUNTER — Other Ambulatory Visit (INDEPENDENT_AMBULATORY_CARE_PROVIDER_SITE_OTHER): Admitting: Pharmacist

## 2024-01-22 DIAGNOSIS — E1151 Type 2 diabetes mellitus with diabetic peripheral angiopathy without gangrene: Secondary | ICD-10-CM

## 2024-01-22 NOTE — Progress Notes (Signed)
 Pharmacy Quality Measure Review  This patient is appearing on a report for being at risk of failing the Glycemic Status Assessment in Diabetes measure this calendar year.    Last documented A1c 6.3 on 01/16/24.   No action needed at this time.   Catie IVAR Centers, PharmD, [facility] Clinical Pharmacist [phone]

## 2024-02-01 ENCOUNTER — Other Ambulatory Visit: Payer: Self-pay | Admitting: Internal Medicine

## 2024-02-01 DIAGNOSIS — I10 Essential (primary) hypertension: Secondary | ICD-10-CM

## 2024-02-01 DIAGNOSIS — E785 Hyperlipidemia, unspecified: Secondary | ICD-10-CM

## 2024-02-04 ENCOUNTER — Other Ambulatory Visit: Payer: Self-pay | Admitting: Internal Medicine

## 2024-03-01 ENCOUNTER — Other Ambulatory Visit: Payer: Self-pay | Admitting: Internal Medicine

## 2024-03-01 DIAGNOSIS — I10 Essential (primary) hypertension: Secondary | ICD-10-CM

## 2024-03-12 ENCOUNTER — Other Ambulatory Visit: Payer: Self-pay | Admitting: Internal Medicine

## 2024-03-26 ENCOUNTER — Ambulatory Visit (HOSPITAL_COMMUNITY)
Admission: RE | Admit: 2024-03-26 | Discharge: 2024-03-26 | Disposition: A | Source: Ambulatory Visit | Attending: Cardiovascular Disease | Admitting: Cardiovascular Disease

## 2024-03-26 ENCOUNTER — Ambulatory Visit: Payer: Self-pay | Admitting: Cardiovascular Disease

## 2024-03-26 DIAGNOSIS — R0989 Other specified symptoms and signs involving the circulatory and respiratory systems: Secondary | ICD-10-CM | POA: Insufficient documentation

## 2024-03-28 ENCOUNTER — Other Ambulatory Visit: Payer: Self-pay | Admitting: *Deleted

## 2024-03-28 DIAGNOSIS — I359 Nonrheumatic aortic valve disorder, unspecified: Secondary | ICD-10-CM

## 2024-04-01 ENCOUNTER — Ambulatory Visit (HOSPITAL_COMMUNITY)
Admission: RE | Admit: 2024-04-01 | Discharge: 2024-04-01 | Attending: Cardiovascular Disease | Admitting: Cardiovascular Disease

## 2024-04-01 ENCOUNTER — Ambulatory Visit: Payer: Self-pay | Admitting: Cardiovascular Disease

## 2024-04-01 DIAGNOSIS — I359 Nonrheumatic aortic valve disorder, unspecified: Secondary | ICD-10-CM

## 2024-04-01 LAB — ECHOCARDIOGRAM COMPLETE
AR max vel: 2.26 cm2
AV Area VTI: 2.27 cm2
AV Area mean vel: 2.32 cm2
AV Mean grad: 6 mmHg
AV Peak grad: 12 mmHg
Ao pk vel: 1.73 m/s
Area-P 1/2: 4.6 cm2
S' Lateral: 2.59 cm

## 2024-04-14 ENCOUNTER — Ambulatory Visit (INDEPENDENT_AMBULATORY_CARE_PROVIDER_SITE_OTHER): Admitting: Internal Medicine

## 2024-04-14 ENCOUNTER — Encounter: Payer: Self-pay | Admitting: Internal Medicine

## 2024-04-14 VITALS — BP 130/70 | HR 70 | Temp 97.6°F | Wt 168.4 lb

## 2024-04-14 DIAGNOSIS — R972 Elevated prostate specific antigen [PSA]: Secondary | ICD-10-CM

## 2024-04-14 DIAGNOSIS — R809 Proteinuria, unspecified: Secondary | ICD-10-CM

## 2024-04-14 DIAGNOSIS — E785 Hyperlipidemia, unspecified: Secondary | ICD-10-CM | POA: Diagnosis not present

## 2024-04-14 DIAGNOSIS — I739 Peripheral vascular disease, unspecified: Secondary | ICD-10-CM

## 2024-04-14 DIAGNOSIS — E1151 Type 2 diabetes mellitus with diabetic peripheral angiopathy without gangrene: Secondary | ICD-10-CM

## 2024-04-14 DIAGNOSIS — E1169 Type 2 diabetes mellitus with other specified complication: Secondary | ICD-10-CM

## 2024-04-14 DIAGNOSIS — N1832 Chronic kidney disease, stage 3b: Secondary | ICD-10-CM | POA: Diagnosis not present

## 2024-04-14 DIAGNOSIS — G4700 Insomnia, unspecified: Secondary | ICD-10-CM | POA: Diagnosis not present

## 2024-04-14 DIAGNOSIS — I10 Essential (primary) hypertension: Secondary | ICD-10-CM | POA: Diagnosis not present

## 2024-04-14 LAB — POCT GLYCOSYLATED HEMOGLOBIN (HGB A1C): Hemoglobin A1C: 5.6 % (ref 4.0–5.6)

## 2024-04-14 MED ORDER — TAMSULOSIN HCL 0.4 MG PO CAPS: 0.4000 mg | ORAL_CAPSULE | Freq: Every day | ORAL | 3 refills | Status: DC

## 2024-04-14 NOTE — Progress Notes (Signed)
 "    Established Patient Office Visit     CC/Reason for Visit: Follow-up chronic conditions   HPI: Jacob Arnold is a 64 y.o. male who is coming in today for the above mentioned reasons. Past Medical History is significant for: Hypertension, hyperlipidemia, type 2 diabetes, vitamin D  deficiency, peripheral artery disease, chronic kidney disease stage IIa.  A few weeks ago had an issue with urinary retention for over 24 hours.  Otherwise has been feeling well.   Past Medical/Surgical History: Past Medical History:  Diagnosis Date   Atherosclerosis of native artery of lower extremity    Diverticulosis    DM (diabetes mellitus) (HCC)    ED (erectile dysfunction)    Elevated TSH    Hyperglycemia    HYPERLIPIDEMIA    HYPERTENSION    Panic attacks    Systolic murmur    Tobacco abuse    Vitamin D  deficiency     Past Surgical History:  Procedure Laterality Date   LEFT HEART CATH AND CORONARY ANGIOGRAPHY N/A 04/12/2022   Procedure: LEFT HEART CATH AND CORONARY ANGIOGRAPHY;  Surgeon: Claudene Victory LELON, MD;  Location: [facility];  Service: Cardiovascular;  Laterality: N/A;   TONSILLECTOMY     VASECTOMY      Social History:  reports that he quit smoking about 3 years ago. His smoking use included cigarettes. He started smoking about 41 years ago. He has a 37 pack-year smoking history. He has never used smokeless tobacco. He reports current alcohol use of about 6.0 standard drinks of alcohol per week. He reports that he does not use drugs.  Allergies: Allergies[1]  Family History:  Family History  Problem Relation Age of Onset   Breast cancer Mother    Liver cancer Mother    Diabetes Father    Lung cancer Father    Hyperlipidemia Neg Hx        family hx   Heart disease Neg Hx        family hx   Hypertension Neg Hx        family hx   Cancer Neg Hx        family hx   Colon cancer Neg Hx    Stomach cancer Neg Hx     Current Medications[2]  Review of Systems:   Negative unless indicated in HPI.   Physical Exam: Vitals:   04/14/24 1000  BP: 130/70  Pulse: 70  Temp: 97.6 F (36.4 C)  TempSrc: Oral  SpO2: 96%  Weight: 168 lb 6.4 oz (76.4 kg)    Body mass index is 23.49 kg/m.   Physical Exam Vitals reviewed.  Constitutional:      Appearance: Normal appearance.  HENT:     Head: Normocephalic and atraumatic.  Eyes:     Conjunctiva/sclera: Conjunctivae normal.  Cardiovascular:     Rate and Rhythm: Normal rate and regular rhythm.  Pulmonary:     Effort: Pulmonary effort is normal.     Breath sounds: Normal breath sounds.  Skin:    General: Skin is warm and dry.  Neurological:     General: No focal deficit present.     Mental Status: He is alert and oriented to person, place, and time.  Psychiatric:        Mood and Affect: Mood normal.        Behavior: Behavior normal.        Thought Content: Thought content normal.        Judgment: Judgment normal.  Impression and Plan:  Type 2 diabetes mellitus with diabetic peripheral angiopathy without gangrene, without long-term current use of insulin (HCC) Assessment & Plan: Well-controlled with an A1c of 5.6.  Orders: -     POCT glycosylated hemoglobin (Hb A1C)  Hyperlipidemia associated with type 2 diabetes mellitus (HCC) Assessment & Plan: Well-controlled with a recent LDL of 62.   Essential hypertension Assessment & Plan: Well-controlled on current.   Microalbuminuria Assessment & Plan: On Farxiga, followed by nephrology.   PAD (peripheral artery disease) Assessment & Plan: Followed by cardiology, on statin.   Elevated PSA Assessment & Plan: With urinary retention.  Start Flomax.  Orders: -     Tamsulosin HCl; Take 1 capsule (0.4 mg total) by mouth daily.  Dispense: 30 capsule; Refill: 3  Insomnia, unspecified type Assessment & Plan: Has issues falling asleep.  We discussed sleep hygiene in great detail.   Stage 3b chronic kidney disease  (HCC) Assessment & Plan: Baseline creatinine around 1.71.8, followed by nephrology.      Time spent:32 minutes reviewing chart, interviewing and examining patient and formulating plan of care.     Tully Theophilus Andrews, MD  Primary Care at [facility]     [1] No Known Allergies [2]  Current Outpatient Medications:    amLODipine  (NORVASC ) 10 MG tablet, TAKE 1 TABLET BY MOUTH EVERY DAY, Disp: 90 tablet, Rfl: 0   aspirin  81 MG tablet, Take 1 tablet (81 mg total) by mouth daily., Disp: 30 tablet, Rfl:    atorvastatin  (LIPITOR) 80 MG tablet, TAKE 1 TABLET BY MOUTH EVERY DAY, Disp: 90 tablet, Rfl: 0   Cholecalciferol (VITAMIN D3) 50 MCG (2000 UT) capsule, Take 2,000 Units by mouth daily., Disp: , Rfl:    cilostazol  (PLETAL ) 100 MG tablet, TAKE 1 TABLET BY MOUTH TWICE A DAY BEFORE MEALS, Disp: 180 tablet, Rfl: 3   ezetimibe  (ZETIA ) 10 MG tablet, TAKE 1 TABLET BY MOUTH EVERY DAY, Disp: 90 tablet, Rfl: 1   FARXIGA 10 MG TABS tablet, Take 10 mg by mouth daily., Disp: , Rfl:    hydrALAZINE  (APRESOLINE ) 50 MG tablet, TAKE 1.5 TABLETS (75 MG TOTAL) BY MOUTH 3 (THREE) TIMES DAILY., Disp: 405 tablet, Rfl: 1   labetalol  (NORMODYNE ) 200 MG tablet, TAKE 2 TABLETS BY MOUTH TWICE A DAY, Disp: 360 tablet, Rfl: 0   levothyroxine  (SYNTHROID ) 50 MCG tablet, TAKE 1 TABLET BY MOUTH EVERY DAY, Disp: 90 tablet, Rfl: 0   lisinopril  (ZESTRIL ) 10 MG tablet, TAKE 1 TABLET BY MOUTH EVERY DAY, Disp: 90 tablet, Rfl: 0   nitroGLYCERIN  (NITROSTAT ) 0.4 MG SL tablet, Dissolve 1 tablet under the tongue every 5 minutes as needed for chest pain. Max of 3 doses, then 911., Disp: 75 tablet, Rfl: 1   Omega-3 Fatty Acids (FISH OIL) 1000 MG CAPS, Take 2,000 mg by mouth daily., Disp: , Rfl:    sodium bicarbonate 650 MG tablet, Take 650 mg by mouth 2 (two) times daily., Disp: , Rfl:    tamsulosin (FLOMAX) 0.4 MG CAPS capsule, Take 1 capsule (0.4 mg total) by mouth daily., Disp: 30 capsule, Rfl: 3  Current  Facility-Administered Medications:    sodium chloride  flush (NS) 0.9 % injection 3 mL, 3 mL, Intravenous, Q12H, Nishan, Peter C, MD  "

## 2024-04-14 NOTE — Assessment & Plan Note (Signed)
 Well-controlled with a recent LDL of 62.

## 2024-04-14 NOTE — Assessment & Plan Note (Signed)
 On Farxiga, followed by nephrology.

## 2024-04-14 NOTE — Assessment & Plan Note (Signed)
 Followed by cardiology, on statin.

## 2024-04-14 NOTE — Assessment & Plan Note (Signed)
 Has issues falling asleep.  We discussed sleep hygiene in great detail.

## 2024-04-14 NOTE — Assessment & Plan Note (Signed)
 Well-controlled with an A1c of 5.6.

## 2024-04-14 NOTE — Assessment & Plan Note (Signed)
 Baseline creatinine around 1.71.8, followed by nephrology.

## 2024-04-14 NOTE — Assessment & Plan Note (Signed)
 Well-controlled on current.

## 2024-04-14 NOTE — Assessment & Plan Note (Signed)
 With urinary retention.  Start Flomax.

## 2024-05-01 ENCOUNTER — Other Ambulatory Visit: Payer: Self-pay

## 2024-05-01 DIAGNOSIS — I739 Peripheral vascular disease, unspecified: Secondary | ICD-10-CM

## 2024-05-04 ENCOUNTER — Other Ambulatory Visit: Payer: Self-pay | Admitting: Internal Medicine

## 2024-05-04 DIAGNOSIS — I10 Essential (primary) hypertension: Secondary | ICD-10-CM

## 2024-05-04 DIAGNOSIS — E785 Hyperlipidemia, unspecified: Secondary | ICD-10-CM

## 2024-05-09 ENCOUNTER — Other Ambulatory Visit: Payer: Self-pay | Admitting: Internal Medicine

## 2024-05-19 ENCOUNTER — Encounter: Payer: Self-pay | Admitting: Internal Medicine

## 2024-05-19 DIAGNOSIS — R972 Elevated prostate specific antigen [PSA]: Secondary | ICD-10-CM

## 2024-05-20 MED ORDER — TAMSULOSIN HCL 0.4 MG PO CAPS: 0.4000 mg | ORAL_CAPSULE | Freq: Every day | ORAL | 1 refills | Status: AC

## 2024-05-26 ENCOUNTER — Ambulatory Visit: Admitting: Surgery

## 2024-05-26 ENCOUNTER — Ambulatory Visit (HOSPITAL_COMMUNITY): Admission: RE | Admit: 2024-05-26 | Discharge: 2024-05-26 | Attending: Surgery | Admitting: Surgery

## 2024-05-26 ENCOUNTER — Encounter: Payer: Self-pay | Admitting: Surgery

## 2024-05-26 VITALS — BP 121/68 | HR 65 | Temp 98.4°F | Ht 71.0 in | Wt 170.0 lb

## 2024-05-26 DIAGNOSIS — I6523 Occlusion and stenosis of bilateral carotid arteries: Secondary | ICD-10-CM | POA: Diagnosis not present

## 2024-05-26 DIAGNOSIS — I70213 Atherosclerosis of native arteries of extremities with intermittent claudication, bilateral legs: Secondary | ICD-10-CM

## 2024-05-26 DIAGNOSIS — I739 Peripheral vascular disease, unspecified: Secondary | ICD-10-CM

## 2024-05-26 LAB — VAS US ABI WITH/WO TBI
Left ABI: 0.9
Right ABI: 0.89

## 2024-05-28 ENCOUNTER — Ambulatory Visit: Admitting: Cardiovascular Disease

## 2024-08-20 ENCOUNTER — Ambulatory Visit: Payer: Self-pay | Admitting: Cardiovascular Disease
